# Patient Record
Sex: Female | Born: 1985 | Race: Black or African American | Hispanic: No | Marital: Single | Smoking: Never smoker
Health system: Southern US, Community
[De-identification: ages and names within clinical notes are randomized; demographics above are authoritative.]

## PROBLEM LIST (undated history)

## (undated) DIAGNOSIS — Z789 Other specified health status: Secondary | ICD-10-CM

## (undated) HISTORY — PX: NO PAST SURGERIES: SHX2092

## (undated) NOTE — *Deleted (*Deleted)
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 yo [id number] at [redacted]w[redacted]d was admitted to the hospital 11/24/2019 for induction of labor.  Indication for induction: Preeclampsia. She had initially presented for a labor eval and had mildly elevated BPs and a urine P/C ratio of 0.48 giving her a pre-e dx without severe features. Her cervix was favorable, requiring some Pitocin and then AROM before progression to delivery.  Her admission Covid test was positive and so appropriate measures were taken. Membrane Rupture Time/Date: 5:25 AM ,11/25/2019   Delivery Method:Vaginal, Spontaneous  Episiotomy: None  Lacerations:  None  Details of delivery can be found in separate delivery note.  Patient had a routine postpartum course. Patient is discharged home 11/25/19.  Newborn Data: Birth date:11/25/2019  Birth time:8:01 AM  Gender:Female  Living status:Living  Apgars:9 ,9  Weight:   Magnesium Sulfate received: No BMZ received: No (did receive 9/29 & 9/30 for PTC) Rhophylac:N/A MMR:N/A T-DaP:Given prenatally Flu: No Transfusion:No  Physical exam  Vitals:   11/25/19 0814 11/25/19 0816 11/25/19 0831 11/25/19 0846  BP:  121/67 115/79 114/75 115/83  Pulse: 93 96 93 88  Resp:  18 18 18   Temp:      TempSrc:      SpO2:      Weight:      Height:       General: {Exam; general:21111117} Lochia: {Desc; appropriate/inappropriate:30686::"appropriate"} Uterine Fundus: {Desc; firm/soft:30687} Incision: {Exam; incision:21111123} DVT Evaluation: {Exam; dvt:2111122} Labs: Lab Results  Component Value Date   WBC 8.0 11/25/2019   HGB 11.1 (L) 11/25/2019   HCT 34.8 (L) 11/25/2019   MCV 83.1 11/25/2019   PLT 214 11/25/2019   CMP Latest Ref Rng & Units 11/24/2019  Glucose 70 - 99 mg/dL 147(W)  BUN 6 - 20 mg/dL 7  Creatinine 2.95 - 6.21 mg/dL 3.08  Sodium 657 - 846 mmol/L 135  Potassium 3.5 - 5.1 mmol/L 3.7  Chloride 98 - 111 mmol/L 107  CO2 22 - 32 mmol/L 18(L)  Calcium 8.9 - 10.3 mg/dL 96.2  Total Protein 6.5 - 8.1 g/dL 6.4(L)  Total Bilirubin 0.3 - 1.2 mg/dL 9.5(M)  Alkaline Phos 38 - 126 U/L 121  AST 15 - 41 U/L 17  ALT 0 - 44 U/L 14   Edinburgh Score: No flowsheet data found.   After visit meds:  Allergies as of 11/25/2019   No Known Allergies   Med Rec must be completed prior to using this [facility]***        Discharge home in stable condition Infant Feeding: {Baby feeding:23562} Infant Disposition:{CHL IP  OB HOME WITH ZOXWRU:04540} Discharge instruction: per After Visit Summary and Postpartum booklet. Activity: Advance as tolerated. Pelvic rest for 6 weeks.  Diet: routine diet Future Appointments: Future Appointments  Date Time Provider Department Center  11/30/2019  9:15 AM Marylene Land, [name] [facility]  12/06/2019  3:35 PM Currie Paris, NP [facility]  12/13/2019  3:35 PM Armando Reichert, [name] [facility]   Follow up Visit:  Follow-up Information    Center for [facility]. Schedule an appointment as soon as possible for a visit in 4 week(s).   Specialty: Obstetrics and Gynecology Why: You will be scheduled for a  blood pressure check in 1 week, and then a 4 week visit for your postpartum appointment Contact information: [address] [place] [address] [phone]             [name], [name]  P [facility] Pool Please schedule this patient for Postpartum visit in: 1 week for BP check; 4 weeks for PP visit with the following provider: Any provider  In-Person  For C/S patients schedule nurse incision check in weeks 2 weeks: no  High risk pregnancy complicated by: pre-e  Delivery mode: SVD  Anticipated Birth Control: OCPs  PP Procedures needed: BP check (1wk)  Schedule Integrated BH visit: no    11/25/2019 [name], [name]

## (undated) NOTE — *Deleted (*Deleted)
Chief Complaint:  Contractions   None     HPI: Jessica Lutz is a 67 y.o. [id number] at [redacted]w[redacted]d by LMP who presents to maternity admissions for labor evaluation with painful contractions and decreased fetal movement.   Pt with constant abdominal pain on right and left lower sides of abdomen, plus intermittent cramping pain with contractions.  She reports she has not felt much movement since the contractions started today.   Location: low abdomen, low back, radiates into vagina Quality: cramping Severity: 9/10 on pain scale Duration: 1 day Timing: every 2 minutes Modifying factors: none Associated signs and symptoms: decreased fetal movement  HPI  Past Medical History: Past Medical History:  Diagnosis Date  . Anemia 03/22/2018    Past obstetric history: OB History  Gravida Para Term Preterm AB Living  4 1 1   2 1   SAB TAB Ectopic Multiple Live Births  2       1    # Outcome Date GA Lbr Len/2nd Weight Sex Delivery Anes PTL Lv  4 Current           3 SAB 03/30/13    U         Birth Comments: System Generated. Please review and update pregnancy details.  2 SAB 01/09/11    U      1 Term 04/13/10 [redacted]w[redacted]d  3714 g F Vag-Vacuum   LIV     Birth Comments: System Generated. Please review and update pregnancy details. wnl    Past Surgical History: Past Surgical History:  Procedure Laterality Date  . NO PAST SURGERIES      Family History: Family History  Problem Relation Age of Onset  . Thyroid disease Mother   . Alcohol abuse Neg Hx     Social History: Social History   Tobacco Use  . Smoking status: Never Smoker  . Smokeless tobacco: Never Used  Vaping Use  . Vaping Use: Never used  Substance Use Topics  . Alcohol use: Not Currently    Comment: occ  . Drug use: Not Currently    Types: Marijuana    Comment: last used 3 wks ago from 07/18/19    Allergies: No Known Allergies  Meds:  Medications Prior to Admission  Medication Sig Dispense Refill Last Dose  . prenatal  vitamin w/FE, FA (PRENATAL 1 + 1) 27-1 MG TABS tablet Take 1 tablet by mouth daily at 12 noon. 90 tablet 3 Past Month at Unknown time  . acetaminophen (TYLENOL) 325 MG tablet Take 2 tablets (650 mg total) by mouth every 6 (six) hours as needed for mild pain or moderate pain (mild discomfortORtemperature>/= 100.4 F).     Marland Kitchen aspirin EC 81 MG tablet Take 1 tablet (81 mg total) by mouth daily. 30 tablet 6 More than a month at Unknown time  . Blood Pressure Monitoring DEVI 1 Device by Does not apply route once a week. 1 Device 0   . terconazole (TERAZOL 7) 0.4 % vaginal cream Place 1 applicator vaginally at bedtime. (Patient not taking: Reported on 11/22/2019) 45 g 0     ROS:  Review of Systems  Constitutional: Negative for chills, fatigue and fever.  Eyes: Negative for visual disturbance.  Respiratory: Negative for shortness of breath.   Cardiovascular: Negative for chest pain.  Gastrointestinal: Positive for abdominal pain. Negative for nausea and vomiting.  Genitourinary: Positive for pelvic pain and vaginal pain. Negative for difficulty urinating, dysuria, flank pain, vaginal bleeding and vaginal discharge.  Musculoskeletal: Positive  for back pain.  Neurological: Negative for dizziness and headaches.  Psychiatric/Behavioral: Negative.      I have reviewed patient's Past Medical Hx, Surgical Hx, Family Hx, Social Hx, medications and allergies.   Physical Exam   Patient Vitals for the past 24 hrs:  BP Temp Temp src Pulse Resp SpO2  11/24/19 0745 126/85 - - 98 18 97 %  11/24/19 0300 124/74 - - (!) 103 - -  11/24/19 0200 126/83 - - (!) 111 - -  11/24/19 0145 - - - - - 99 %  11/24/19 0140 - - - - - 100 %  11/24/19 0135 - - - - - 100 %  11/24/19 0131 117/84 - - (!) 106 - -  11/24/19 0130 - - - - - 97 %  11/24/19 0101 127/86 - - (!) 109 - -  11/24/19 0045 131/82 - - (!) 107 - -  11/24/19 0039 (!) 146/93 97.9 F (36.6 C) Oral (!) 109 20 100 %   Constitutional: Well-developed,  well-nourished female in no acute distress.  HEART: normal rate, heart sounds, regular rhythm RESP: normal effort, lung sounds clear and equal bilaterally GI: Abd soft, non-tender, gravid appropriate for gestational age.  MS: Extremities nontender, +1 BLE edema, normal ROM Neurologic: Alert and oriented x 4.  GU: Neg CVAT.   Dilation: 5 Effacement (%): 70 Cervical Position: Posterior Station: -2 Presentation: Vertex Exam by:: Sharen Counter, [name]  FHT:  Baseline 150 , moderate variability, accelerations present, no decelerations Contractions: q 2-5 mins, mild to moderate to palpation   Labs: Results for orders placed or performed during the hospital encounter of 11/24/19 (from the past 24 hour(s))  CBC     Status: Abnormal   Collection Time: 11/24/19  2:20 AM  Result Value Ref Range   WBC 7.6 4.0 - 10.5 K/uL   RBC 4.42 3.87 - 5.11 MIL/uL   Hemoglobin 11.5 (L) 12.0 - 15.0 g/dL   HCT 16.1 36 - 46 %   MCV 82.8 80.0 - 100.0 fL   MCH 26.0 26.0 - 34.0 pg   MCHC 31.4 30.0 - 36.0 g/dL   RDW 09.6 04.5 - 40.9 %   Platelets 233 150 - 400 K/uL   nRBC 0.0 0.0 - 0.2 %  Comprehensive metabolic panel     Status: Abnormal   Collection Time: 11/24/19  2:20 AM  Result Value Ref Range   Sodium 135 135 - 145 mmol/L   Potassium 3.7 3.5 - 5.1 mmol/L   Chloride 107 98 - 111 mmol/L   CO2 18 (L) 22 - 32 mmol/L   Glucose, Bld 110 (H) 70 - 99 mg/dL   BUN 7 6 - 20 mg/dL   Creatinine, Ser 8.11 0.44 - 1.00 mg/dL   Calcium 91.4 8.9 - 78.2 mg/dL   Total Protein 6.4 (L) 6.5 - 8.1 g/dL   Albumin 2.8 (L) 3.5 - 5.0 g/dL   AST 17 15 - 41 U/L   ALT 14 0 - 44 U/L   Alkaline Phosphatase 121 38 - 126 U/L   Total Bilirubin 0.2 (L) 0.3 - 1.2 mg/dL   GFR, Estimated >95 >62 mL/min   Anion gap 10 5 - 15  Protein / creatinine ratio, urine     Status: Abnormal   Collection Time: 11/24/19  3:31 AM  Result Value Ref Range   Creatinine, Urine 60.84 mg/dL   Total Protein, Urine 29 mg/dL   Protein Creatinine  Ratio 0.48 (H) 0.00 - 0.15 mg/mg[Cre]  Respiratory  Panel by RT PCR (Flu A&B, Covid) - Nasopharyngeal Swab     Status: Abnormal   Collection Time: 11/24/19  5:02 AM   Specimen: Nasopharyngeal Swab  Result Value Ref Range   SARS Coronavirus 2 by RT PCR POSITIVE (A) NEGATIVE   Influenza A by PCR NEGATIVE NEGATIVE   Influenza B by PCR NEGATIVE NEGATIVE   --/--/A POS (09/29 0810)  Imaging:  No results found.  MAU Course/MDM: Orders Placed This Encounter  Procedures  . Respiratory Panel by RT PCR (Flu A&B, Covid) - Nasopharyngeal Swab  . CBC  . Comprehensive metabolic panel  . Protein / creatinine ratio, urine  . Discharge patient    Meds ordered this encounter  Medications  . lactated ringers bolus 1,000 mL  . fentaNYL (SUBLIMAZE) injection 100 mcg  . ondansetron (ZOFRAN) injection 4 mg  . ondansetron (ZOFRAN) injection 4 mg  . DISCONTD: ondansetron (ZOFRAN) injection 4 mg  . oxyCODONE-acetaminophen (PERCOCET/ROXICET) 5-325 MG tablet    Sig: Take 2 tablets by mouth every 4 (four) hours as needed.    Dispense:  4 tablet    Refill:  0    Order Specific Question:   Supervising Provider    Answer:   Jaynie Collins A [[id number]]     NST reviewed and reactive Initial BP elevated, no s/sx of PEC Cervix 4.5/80/-2, unchanged in 1 hour following exam from RN but pt breathing with frequent contractions at this time IV started and IV Fentanyl given for pain management PEC labs ordered and sent  P/C ratio 0.48 Cervix remains 4-5 cm/70%/-2, vertex  Consult Dr Lovett Sox with presentation, exam findings and test results.     Treatments in MAU included ***.   Pt discharge with strict *** precautions.    Assessment: 1. Threatened labor at term   2. Lab test positive for detection of COVID-19 virus   3. Elevated blood-pressure reading, without diagnosis of hypertension   4. [redacted] weeks gestation of pregnancy     Plan: Discharge home Labor precautions and fetal kick counts  Follow-up  Information    Center for [facility] Follow up.   Specialty: Obstetrics and Gynecology Why: Return to MAU as needed for emergencies. Contact information: [address] [place] [address] [phone]             Allergies as of 11/24/2019   No Known Allergies   Med Rec must be completed prior to using this [facility]***       Sharen Counter Certified Nurse-Midwife 11/24/2019 8:43 AM

---
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
## 2006-03-26 ENCOUNTER — Emergency Department (HOSPITAL_COMMUNITY): Admission: EM | Admit: 2006-03-26 | Discharge: 2006-03-26 | Payer: Self-pay | Admitting: Emergency Medicine

## 2006-04-24 ENCOUNTER — Emergency Department (HOSPITAL_COMMUNITY): Admission: EM | Admit: 2006-04-24 | Discharge: 2006-04-25 | Payer: Self-pay | Admitting: Emergency Medicine

## 2007-10-06 ENCOUNTER — Emergency Department (HOSPITAL_COMMUNITY): Admission: EM | Admit: 2007-10-06 | Discharge: 2007-10-06 | Payer: Self-pay | Admitting: Emergency Medicine

## 2008-01-02 ENCOUNTER — Emergency Department (HOSPITAL_COMMUNITY): Admission: EM | Admit: 2008-01-02 | Discharge: 2008-01-02 | Payer: Self-pay | Admitting: Emergency Medicine

## 2008-04-26 ENCOUNTER — Emergency Department (HOSPITAL_COMMUNITY): Admission: EM | Admit: 2008-04-26 | Discharge: 2008-04-26 | Payer: Self-pay | Admitting: Emergency Medicine

## 2009-08-08 ENCOUNTER — Ambulatory Visit: Payer: Self-pay | Admitting: Nurse Practitioner

## 2009-08-08 ENCOUNTER — Inpatient Hospital Stay (HOSPITAL_COMMUNITY): Admission: AD | Admit: 2009-08-08 | Discharge: 2009-08-08 | Payer: Self-pay | Admitting: Obstetrics & Gynecology

## 2009-11-13 ENCOUNTER — Ambulatory Visit (HOSPITAL_COMMUNITY): Admission: RE | Admit: 2009-11-13 | Discharge: 2009-11-13 | Payer: Self-pay | Admitting: Obstetrics & Gynecology

## 2010-03-01 ENCOUNTER — Inpatient Hospital Stay (HOSPITAL_COMMUNITY)
Admission: AD | Admit: 2010-03-01 | Discharge: 2010-03-02 | Payer: Self-pay | Source: Home / Self Care | Attending: Obstetrics & Gynecology | Admitting: Obstetrics & Gynecology

## 2010-03-03 ENCOUNTER — Inpatient Hospital Stay (HOSPITAL_COMMUNITY)
Admission: AD | Admit: 2010-03-03 | Discharge: 2010-03-03 | Payer: Self-pay | Source: Home / Self Care | Attending: Obstetrics and Gynecology | Admitting: Obstetrics and Gynecology

## 2010-03-03 ENCOUNTER — Other Ambulatory Visit: Payer: Self-pay | Admitting: Obstetrics and Gynecology

## 2010-03-04 LAB — URINALYSIS, ROUTINE W REFLEX MICROSCOPIC
Bilirubin Urine: NEGATIVE
Hgb urine dipstick: NEGATIVE
Hgb urine dipstick: NEGATIVE
Ketones, ur: NEGATIVE mg/dL
Nitrite: NEGATIVE
Protein, ur: NEGATIVE mg/dL
Specific Gravity, Urine: 1.02 (ref 1.005–1.030)
Urine Glucose, Fasting: NEGATIVE mg/dL
Urobilinogen, UA: 0.2 mg/dL (ref 0.0–1.0)

## 2010-03-04 LAB — URINE MICROSCOPIC-ADD ON

## 2010-03-24 ENCOUNTER — Inpatient Hospital Stay (HOSPITAL_COMMUNITY)
Admission: AD | Admit: 2010-03-24 | Discharge: 2010-03-25 | Disposition: A | Discharge status: Home / Self Care | Payer: Managed Care, Other (non HMO) | Attending: Obstetrics and Gynecology | Admitting: Obstetrics and Gynecology

## 2010-03-24 DIAGNOSIS — O99891 Other specified diseases and conditions complicating pregnancy: Secondary | ICD-10-CM | POA: Insufficient documentation

## 2010-03-24 DIAGNOSIS — R109 Unspecified abdominal pain: Secondary | ICD-10-CM | POA: Insufficient documentation

## 2010-04-13 ENCOUNTER — Inpatient Hospital Stay (HOSPITAL_COMMUNITY)
Admission: RE | Admit: 2010-04-13 | Discharge: 2010-04-15 | DRG: 775 | Disposition: A | Discharge status: Home / Self Care | Payer: Managed Care, Other (non HMO) | Source: Ambulatory Visit | Attending: Obstetrics and Gynecology | Admitting: Obstetrics and Gynecology

## 2010-04-13 DIAGNOSIS — D649 Anemia, unspecified: Secondary | ICD-10-CM | POA: Diagnosis not present

## 2010-04-13 DIAGNOSIS — O99892 Other specified diseases and conditions complicating childbirth: Secondary | ICD-10-CM | POA: Diagnosis present

## 2010-04-13 DIAGNOSIS — O9903 Anemia complicating the puerperium: Secondary | ICD-10-CM | POA: Diagnosis not present

## 2010-04-13 DIAGNOSIS — Z22330 Carrier of Group B streptococcus: Secondary | ICD-10-CM

## 2010-04-13 LAB — CBC
HCT: 33.1 % — ABNORMAL LOW (ref 36.0–46.0)
Hemoglobin: 11.1 g/dL — ABNORMAL LOW (ref 12.0–15.0)
MCV: 77.9 fL — ABNORMAL LOW (ref 78.0–100.0)
RBC: 4.25 MIL/uL (ref 3.87–5.11)
WBC: 8.9 10*3/uL (ref 4.0–10.5)

## 2010-04-14 LAB — CBC
Hemoglobin: 8.5 g/dL — ABNORMAL LOW (ref 12.0–15.0)
MCH: 26 pg (ref 26.0–34.0)
MCV: 79.5 fL (ref 78.0–100.0)
RBC: 3.27 MIL/uL — ABNORMAL LOW (ref 3.87–5.11)
WBC: 14.9 10*3/uL — ABNORMAL HIGH (ref 4.0–10.5)

## 2010-04-17 NOTE — Discharge Summary (Signed)
  [name], Jessica Lutz              ACCOUNT NO.:  [id number]  MEDICAL RECORD NO.:  [id number]           PATIENT TYPE:  I  LOCATION:  9146                          FACILITY:  [facility]  PHYSICIAN:  Gerrit Friends. Aldona Bar, M.D.   DATE OF BIRTH:  [birth year]  DATE OF ADMISSION:  04/13/2010 DATE OF DISCHARGE:  04/15/2010                              DISCHARGE SUMMARY   DISCHARGE DIAGNOSES: 1. Term pregnancy, delivered 8 pounds 2 ounces female infant, Apgars 9     and 9. 2. Blood type A positive. 3. Obesity.  PROCEDURE: 1. Vacuum extraction assisted delivery. 2. First-degree tear and bilateral clitoral tears, repaired.  SUMMARY:  This 25 year old gravida 1 with a due date of April 11, 2010, was admitted in labor.  Group B strep was positive, and she received intrapartum antibiotics.  She progressed and required the use of the vacuum extractor to assist her delivery because of poor pushing effort. She was delivered of a viable 8 pounds 2 ounces female infant with Apgars of 9 and 9 over first-degree tear.  There were also bilateral clitoral tears, which were repaired as well after delivery.  Her postpartum course was benign.  Her discharge hemoglobin was 8.5 with a white count of 14,900, and a platelet count of 201,000.  On the morning of April 15, 2010, she was ambulating well, tolerating a regular diet well, having normal bowel and bladder function, was afebrile.  Her breast-feeding was doing well, and she was desirous of discharge. Accordingly, she was given all appropriate instructions per discharge brochure and understood all instructions well.  DISCHARGE MEDICATIONS:  Vitamins - 1 a day as long as she is breast- feeding, Feosol capsules with the equivalent thereof 1 daily, and she was given prescriptions for Motrin 600 mg to use every 6 hours as needed for cramping or mild pain, and Tylox 1-2 every 4-6 hours as needed for more severe pain.  She will return to the office in approximately 4  weeks' time or as needed.  CONDITION ON DISCHARGE:  Improved.     Gerrit Friends. Aldona Bar, M.D.     RMW/MEDQ  D:  04/15/2010  T:  04/15/2010  Job:  [id number]  Electronically Signed by Jessica Lutz M.D. on 04/16/2010 05:59:05 AM

## 2010-04-27 LAB — URINALYSIS, ROUTINE W REFLEX MICROSCOPIC
Bilirubin Urine: NEGATIVE
Glucose, UA: NEGATIVE mg/dL
Ketones, ur: NEGATIVE mg/dL
Nitrite: NEGATIVE
Specific Gravity, Urine: 1.01 (ref 1.005–1.030)
pH: 7 (ref 5.0–8.0)

## 2010-04-27 LAB — POCT PREGNANCY, URINE: Preg Test, Ur: POSITIVE

## 2010-04-27 LAB — URINE MICROSCOPIC-ADD ON

## 2010-05-22 LAB — DIFFERENTIAL
Basophils Relative: 0 % (ref 0–1)
Eosinophils Absolute: 0 10*3/uL (ref 0.0–0.7)
Eosinophils Relative: 0 % (ref 0–5)
Lymphs Abs: 1.6 10*3/uL (ref 0.7–4.0)
Monocytes Absolute: 0.6 10*3/uL (ref 0.1–1.0)
Monocytes Relative: 7 % (ref 3–12)
Neutrophils Relative %: 75 % (ref 43–77)

## 2010-05-22 LAB — LIPASE, BLOOD: Lipase: 20 U/L (ref 11–59)

## 2010-05-22 LAB — BASIC METABOLIC PANEL
CO2: 27 mEq/L (ref 19–32)
Chloride: 106 mEq/L (ref 96–112)
Creatinine, Ser: 0.83 mg/dL (ref 0.4–1.2)
GFR calc Af Amer: >60 mL/min (ref >60)
Potassium: 3.3 mEq/L — ABNORMAL LOW (ref 3.5–5.1)
Sodium: 137 mEq/L (ref 135–145)

## 2010-05-22 LAB — HEPATIC FUNCTION PANEL
Albumin: 3.4 g/dL — ABNORMAL LOW (ref 3.5–5.2)
Alkaline Phosphatase: 68 U/L (ref 39–117)
Bilirubin, Direct: 0.1 mg/dL (ref 0.0–0.3)
Total Bilirubin: 0.7 mg/dL (ref 0.3–1.2)

## 2010-05-22 LAB — URINE MICROSCOPIC-ADD ON

## 2010-05-22 LAB — URINALYSIS, ROUTINE W REFLEX MICROSCOPIC
Bilirubin Urine: NEGATIVE
Ketones, ur: NEGATIVE mg/dL
Leukocytes, UA: NEGATIVE
Nitrite: NEGATIVE
Specific Gravity, Urine: 1.024 (ref 1.005–1.030)
Urobilinogen, UA: 1 mg/dL (ref 0.0–1.0)
pH: 7 (ref 5.0–8.0)

## 2010-05-22 LAB — CBC
HCT: 37 % (ref 36.0–46.0)
Hemoglobin: 12.4 g/dL (ref 12.0–15.0)
MCHC: 33.7 g/dL (ref 30.0–36.0)
MCV: 85.1 fL (ref 78.0–100.0)
RBC: 4.34 MIL/uL (ref 3.87–5.11)
WBC: 8.7 10*3/uL (ref 4.0–10.5)

## 2010-05-22 LAB — POCT PREGNANCY, URINE: Preg Test, Ur: NEGATIVE

## 2010-11-11 LAB — GC/CHLAMYDIA PROBE AMP, GENITAL
Chlamydia, DNA Probe: NEGATIVE
GC Probe Amp, Genital: NEGATIVE

## 2010-11-11 LAB — WET PREP, GENITAL: Trich, Wet Prep: NONE SEEN

## 2010-11-11 LAB — CBC
HCT: 37.8
Platelets: 234
RDW: 12.6
WBC: 6.3

## 2010-11-11 LAB — POCT I-STAT, CHEM 8
Chloride: 103
Glucose, Bld: 90
HCT: 39
Potassium: 3.8

## 2010-11-11 LAB — DIFFERENTIAL
Basophils Absolute: 0
Eosinophils Absolute: 0.1
Eosinophils Relative: 1
Lymphocytes Relative: 52 — ABNORMAL HIGH
Neutrophils Relative %: 38 — ABNORMAL LOW

## 2010-12-29 ENCOUNTER — Inpatient Hospital Stay (HOSPITAL_COMMUNITY)
Admission: AD | Admit: 2010-12-29 | Discharge: 2010-12-29 | Disposition: A | Discharge status: Home / Self Care | Payer: Managed Care, Other (non HMO) | Source: Ambulatory Visit | Attending: Obstetrics & Gynecology | Admitting: Obstetrics & Gynecology

## 2010-12-29 ENCOUNTER — Encounter (HOSPITAL_COMMUNITY): Payer: Self-pay | Admitting: *Deleted

## 2010-12-29 DIAGNOSIS — Z3201 Encounter for pregnancy test, result positive: Secondary | ICD-10-CM

## 2010-12-29 HISTORY — DX: Other specified health status: Z78.9

## 2010-12-29 MED ORDER — PROMETHAZINE HCL 25 MG PO TABS: 25.0000 mg | ORAL_TABLET | Freq: Four times a day (QID) | ORAL | Status: AC | PRN

## 2010-12-29 NOTE — ED Provider Notes (Signed)
History   Pt presents today requesting a preg test. She states she missed her period and has had some nausea. She denies pain, vag dc, bleeding, or any problems. She states she just wanted a preg test. She has an 43 month old girl at home.  Chief Complaint  Patient presents with  . Possible Pregnancy   HPI  OB History    Grav Para Term Preterm Abortions TAB SAB Ect Mult Living   1 1 1       1       Past Medical History  Diagnosis Date  . No pertinent past medical history     Past Surgical History  Procedure Date  . No past surgeries     No family history on file.  History  Substance Use Topics  . Smoking status: Never Smoker   . Smokeless tobacco: Not on file  . Alcohol Use: No    Allergies: No Known Allergies  No prescriptions prior to admission    Review of Systems  Constitutional: Negative for fever.  Cardiovascular: Negative for chest pain.  Gastrointestinal: Negative for nausea, vomiting, abdominal pain, diarrhea and constipation.  Genitourinary: Negative for dysuria, urgency, frequency and hematuria.  Neurological: Negative for dizziness and headaches.  Psychiatric/Behavioral: Negative for depression and suicidal ideas.   Physical Exam   Blood pressure 129/74, pulse 88, temperature 99 F (37.2 C), temperature source Oral, resp. rate 20, height 5\' 5"  (1.651 m), weight 188 lb (85.276 kg), last menstrual period 11/22/2010, SpO2 98.00%.  Physical Exam  Nursing note and vitals reviewed. Constitutional: She is oriented to person, place, and time. She appears well-developed and well-nourished. No distress.  HENT:  Head: Normocephalic and atraumatic.  Eyes: EOM are normal. Pupils are equal, round, and reactive to light.  GI: Soft. She exhibits no distension. There is no tenderness. There is no rebound and no guarding.  Neurological: She is alert and oriented to person, place, and time.  Skin: Skin is warm and dry. She is not diaphoretic.  Psychiatric: She has  a normal mood and affect. Her behavior is normal. Judgment and thought content normal.    MAU Course  Procedures  Results for orders placed during the hospital encounter of 12/29/10 (from the past 24 hour(s))  POCT PREGNANCY, URINE     Status: Normal   Collection Time   12/29/10  9:56 PM      Component Value Range   Preg Test, Ur POSITIVE       Assessment and Plan  Preg: discussed with pt at length. Will give Rx for phenergan to use prn. Discussed diet, activity, risks, and precautions.  Clinton Gallant. Ranell Skibinski III, DrHSc, MPAS, PA-C  12/29/2010, 10:22 PM   Henrietta Hoover, PA 12/29/10 2225

## 2010-12-29 NOTE — Progress Notes (Signed)
Pt states she just wants to find out if she is pregnant

## 2010-12-31 ENCOUNTER — Inpatient Hospital Stay (HOSPITAL_COMMUNITY)
Admission: AD | Admit: 2010-12-31 | Discharge: 2010-12-31 | Disposition: A | Discharge status: Home / Self Care | Payer: Managed Care, Other (non HMO) | Source: Ambulatory Visit | Attending: Obstetrics & Gynecology | Admitting: Obstetrics & Gynecology

## 2010-12-31 ENCOUNTER — Inpatient Hospital Stay (HOSPITAL_COMMUNITY): Payer: Managed Care, Other (non HMO)

## 2010-12-31 ENCOUNTER — Encounter (HOSPITAL_COMMUNITY): Payer: Self-pay | Admitting: *Deleted

## 2010-12-31 DIAGNOSIS — O209 Hemorrhage in early pregnancy, unspecified: Secondary | ICD-10-CM

## 2010-12-31 LAB — CBC
Hemoglobin: 13 g/dL (ref 12.0–15.0)
MCH: 27 pg (ref 26.0–34.0)
RBC: 4.82 MIL/uL (ref 3.87–5.11)

## 2010-12-31 LAB — ABO/RH: ABO/RH(D): A POS

## 2010-12-31 LAB — WET PREP, GENITAL
Clue Cells Wet Prep HPF POC: NONE SEEN
Trich, Wet Prep: NONE SEEN

## 2010-12-31 LAB — HCG, QUANTITATIVE, PREGNANCY: hCG, Beta Chain, Quant, S: 63 m[IU]/mL — ABNORMAL HIGH (ref <5)

## 2010-12-31 NOTE — ED Provider Notes (Signed)
History   Pt presents today c/o vag bleeding, "like a period." She states she is also having some cramping. She denies vag irritation, severe abd pain, fever, or any other sx at this time.  Chief Complaint  Patient presents with  . Vaginal Bleeding   HPI  OB History    Grav Para Term Preterm Abortions TAB SAB Ect Mult Living   2 1 1       1       Past Medical History  Diagnosis Date  . No pertinent past medical history     Past Surgical History  Procedure Date  . No past surgeries     History reviewed. No pertinent family history.  History  Substance Use Topics  . Smoking status: Never Smoker   . Smokeless tobacco: Not on file  . Alcohol Use: No    Allergies: No Known Allergies  Prescriptions prior to admission  Medication Sig Dispense Refill  . promethazine (PHENERGAN) 25 MG tablet Take 1 tablet (25 mg total) by mouth every 6 (six) hours as needed for nausea.  30 tablet  0    Review of Systems  Constitutional: Negative for fever.  Cardiovascular: Negative for chest pain.  Gastrointestinal: Positive for abdominal pain. Negative for nausea, vomiting, diarrhea and constipation.  Genitourinary: Negative for dysuria, urgency, frequency and hematuria.  Neurological: Negative for dizziness and headaches.  Psychiatric/Behavioral: Negative for depression and suicidal ideas.   Physical Exam   Blood pressure 105/64, pulse 86, temperature 98.5 F (36.9 C), temperature source Oral, resp. rate 20, height 5\' 7"  (1.702 m), weight 187 lb 12.8 oz (85.186 kg), last menstrual period 11/22/2010, SpO2 98.00%, unknown if currently breastfeeding.  Physical Exam  Nursing note and vitals reviewed. Constitutional: She is oriented to person, place, and time. She appears well-developed and well-nourished. No distress.  HENT:  Head: Normocephalic and atraumatic.  Eyes: EOM are normal. Pupils are equal, round, and reactive to light.  GI: Soft. She exhibits no distension. There is  tenderness. There is no rebound and no guarding.  Genitourinary: There is bleeding around the vagina. Vaginal discharge found.       Bloody dc present in vag  Vault. Cervix Lg/closed. No adnexal masses. Uterus slightly tender to palpation.  Neurological: She is alert and oriented to person, place, and time.  Skin: Skin is warm and dry. She is not diaphoretic.  Psychiatric: She has a normal mood and affect. Her behavior is normal. Judgment and thought content normal.    MAU Course  Procedures  Results for orders placed during the hospital encounter of 12/31/10 (from the past 24 hour(s))  CBC     Status: Normal   Collection Time   12/31/10 11:35 AM      Component Value Range   WBC 6.8  4.0 - 10.5 (K/uL)   RBC 4.82  3.87 - 5.11 (MIL/uL)   Hemoglobin 13.0  12.0 - 15.0 (g/dL)   HCT 29.5  28.4 - 13.2 (%)   MCV 81.7  78.0 - 100.0 (fL)   MCH 27.0  26.0 - 34.0 (pg)   MCHC 33.0  30.0 - 36.0 (g/dL)   RDW 44.0  10.2 - 72.5 (%)   Platelets 290  150 - 400 (K/uL)  HCG, QUANTITATIVE, PREGNANCY     Status: Abnormal   Collection Time   12/31/10 11:35 AM      Component Value Range   hCG, Beta Chain, Quant, S 63 (*) <5 (mIU/mL)  ABO/RH     Status:  Normal   Collection Time   12/31/10 11:35 AM      Component Value Range   ABO/RH(D) A POS    WET PREP, GENITAL     Status: Abnormal   Collection Time   12/31/10 12:30 PM      Component Value Range   Yeast, Wet Prep NONE SEEN  NONE SEEN    Trich, Wet Prep NONE SEEN  NONE SEEN    Clue Cells, Wet Prep NONE SEEN  NONE SEEN    WBC, Wet Prep HPF POC FEW (*) NONE SEEN    US shows no IUP or adnexal masses.  Assessment and Plan  Bleeding in preg: discussed with pt at length. She will return on 01/02/11 for repeat B-quant. Discussed signs and sx of ectopic preg. Discussed diet, activity, risks, and precautions.  Clinton Gallant. Clerence Gubser III, DrHSc, MPAS, PA-C  12/31/2010, 12:34 PM   Henrietta Hoover, PA 12/31/10 1302

## 2010-12-31 NOTE — Progress Notes (Signed)
Was here Mon, had spotting last week did not tell them.  Started spotting again yesterday, today is more like a light period, but dark in color.

## 2010-12-31 NOTE — Progress Notes (Signed)
Pt in c/o vaginal bleeding since yesterday, was light yesterday but today is a little heavier like light period with darker blood.   Denies any abdominal pain.  Last intercourse 12/19/10.

## 2011-01-01 LAB — GC/CHLAMYDIA PROBE AMP, GENITAL: Chlamydia, DNA Probe: NEGATIVE

## 2011-01-02 ENCOUNTER — Inpatient Hospital Stay (HOSPITAL_COMMUNITY)
Admission: AD | Admit: 2011-01-02 | Discharge: 2011-01-02 | Disposition: A | Discharge status: Home / Self Care | Payer: Managed Care, Other (non HMO) | Source: Ambulatory Visit | Attending: Obstetrics & Gynecology | Admitting: Obstetrics & Gynecology

## 2011-01-02 DIAGNOSIS — O209 Hemorrhage in early pregnancy, unspecified: Secondary | ICD-10-CM

## 2011-01-02 NOTE — ED Provider Notes (Signed)
History   Pt presents today for repeat B-quant secondary to bleeding in preg. She states her bleeding has improved and she only has minimal cramping. She denies any new sx at this time.  Chief Complaint  Patient presents with  . Follow-up   HPI  OB History    Grav Para Term Preterm Abortions TAB SAB Ect Mult Living   2 1 1       1       Past Medical History  Diagnosis Date  . No pertinent past medical history     Past Surgical History  Procedure Date  . No past surgeries     No family history on file.  History  Substance Use Topics  . Smoking status: Never Smoker   . Smokeless tobacco: Not on file  . Alcohol Use: No    Allergies: No Known Allergies  Prescriptions prior to admission  Medication Sig Dispense Refill  . promethazine (PHENERGAN) 25 MG tablet Take 1 tablet (25 mg total) by mouth every 6 (six) hours as needed for nausea.  30 tablet  0    Review of Systems  Constitutional: Negative for fever.  Cardiovascular: Negative for chest pain.  Gastrointestinal: Negative for nausea, vomiting, abdominal pain, diarrhea and constipation.  Genitourinary: Negative for dysuria, urgency, frequency and hematuria.  Neurological: Negative for dizziness and headaches.  Psychiatric/Behavioral: Negative for depression and suicidal ideas.   Physical Exam   Blood pressure 115/70, pulse 69, last menstrual period 11/22/2010, unknown if currently breastfeeding.  Physical Exam  Nursing note and vitals reviewed. Constitutional: She is oriented to person, place, and time. She appears well-developed and well-nourished. No distress.  HENT:  Head: Normocephalic and atraumatic.  Eyes: EOM are normal. Pupils are equal, round, and reactive to light.  GI: Soft. She exhibits no distension. There is no tenderness. There is no rebound and no guarding.  Neurological: She is alert and oriented to person, place, and time.  Skin: Skin is warm and dry. She is not diaphoretic.  Psychiatric:  She has a normal mood and affect. Her behavior is normal. Judgment and thought content normal.    MAU Course  Procedures  Results for orders placed during the hospital encounter of 01/02/11 (from the past 24 hour(s))  HCG, QUANTITATIVE, PREGNANCY     Status: Abnormal   Collection Time   01/02/11  1:56 PM      Component Value Range   hCG, Beta Chain, Quant, S 147 (*) <5 (mIU/mL)     Assessment and Plan  Bleeding in preg: discussed with pt at length. She will return in 1wk for repeat US to confirm IUP and viability. Discussed diet, activity, risks, and precautions. Discussed signs and sx of ectopic preg.  Clinton Gallant. Tabbitha Janvrin III, DrHSc, MPAS, PA-C  01/02/2011, 1:54 PM   Henrietta Hoover, PA 01/02/11 1448

## 2011-01-02 NOTE — Progress Notes (Signed)
Patient to MAU for a repeat BHCG. Patient states she is having no pain and a little light bleeding.

## 2011-01-09 ENCOUNTER — Inpatient Hospital Stay (HOSPITAL_COMMUNITY)
Admission: AD | Admit: 2011-01-09 | Discharge: 2011-01-09 | Disposition: A | Discharge status: Home / Self Care | Payer: Managed Care, Other (non HMO) | Source: Ambulatory Visit | Attending: Family Medicine | Admitting: Family Medicine

## 2011-01-09 ENCOUNTER — Inpatient Hospital Stay (HOSPITAL_COMMUNITY): Payer: Managed Care, Other (non HMO)

## 2011-01-09 ENCOUNTER — Encounter (HOSPITAL_COMMUNITY): Payer: Self-pay

## 2011-01-09 DIAGNOSIS — O039 Complete or unspecified spontaneous abortion without complication: Secondary | ICD-10-CM | POA: Insufficient documentation

## 2011-01-09 DIAGNOSIS — O4690 Antepartum hemorrhage, unspecified, unspecified trimester: Secondary | ICD-10-CM

## 2011-01-09 NOTE — ED Provider Notes (Signed)
History     Chief Complaint  Patient presents with  . Vaginal Bleeding  . Follow-up   HPI Pt here for ultrasound.  Seen previously for vaginal bleeding on 11/21.  BHCGs 63 11/21, 147 11/23; A+ blood type; wet prep neg.    Past Medical History  Diagnosis Date  . No pertinent past medical history     Past Surgical History  Procedure Date  . No past surgeries     History reviewed. No pertinent family history.  History  Substance Use Topics  . Smoking status: Never Smoker   . Smokeless tobacco: Never Used  . Alcohol Use: No    Allergies: No Known Allergies  No prescriptions prior to admission    ROS No problems at this visit  Physical Exam   Blood pressure 106/66, pulse 81, temperature 98.5 F (36.9 C), temperature source Oral, resp. rate 16, height 5\' 6"  (1.676 m), weight 85.503 kg (188 lb 8 oz), last menstrual period 11/22/2010.  Physical Exam  Constitutional: She is oriented to person, place, and time. She appears well-developed and well-nourished. No distress.  HENT:  Head: Normocephalic.  Neck: Normal range of motion. Neck supple.  Neurological: She is alert and oriented to person, place, and time. She has normal reflexes.  Skin: Skin is warm and dry.    MAU Course  Procedures  BHCG - 38 [place] - No IUP or adnexal mass identified.  Recommend continued f/u of BHCG (quant).  Assessment and Plan  Miscarriage  Plan: DC to home *Called pt back and notified her of decreasing beta HCG Return in one week for [facility]  [facility] 01/09/2011, 4:11 PM

## 2011-01-09 NOTE — Progress Notes (Signed)
Pt called at [phone] and notified of decreasing BHCG; this in combination with no IUP identified on [place] indicates miscarriage; informed to follow-up in one week for repeat BHCG; given ectopic precautions.

## 2011-01-09 NOTE — Progress Notes (Signed)
Pt states, " I am here for a follow-up u/s from one week ago. I am still having vaginal bleeding, but no pain."

## 2011-01-10 NOTE — ED Provider Notes (Signed)
Chart reviewed and agree with management and plan.  

## 2011-01-16 ENCOUNTER — Ambulatory Visit (HOSPITAL_COMMUNITY): Payer: Managed Care, Other (non HMO)

## 2012-01-18 ENCOUNTER — Inpatient Hospital Stay (HOSPITAL_COMMUNITY)
Admission: AD | Admit: 2012-01-18 | Discharge: 2012-01-18 | Disposition: A | Discharge status: Home / Self Care | Payer: Managed Care, Other (non HMO) | Source: Ambulatory Visit | Attending: Obstetrics & Gynecology | Admitting: Obstetrics & Gynecology

## 2012-01-18 DIAGNOSIS — M545 Low back pain, unspecified: Secondary | ICD-10-CM | POA: Insufficient documentation

## 2012-01-18 DIAGNOSIS — R1012 Left upper quadrant pain: Secondary | ICD-10-CM | POA: Insufficient documentation

## 2012-01-18 LAB — POCT PREGNANCY, URINE: Preg Test, Ur: NEGATIVE

## 2012-01-18 LAB — URINALYSIS, ROUTINE W REFLEX MICROSCOPIC
Glucose, UA: NEGATIVE mg/dL
Hgb urine dipstick: NEGATIVE
Specific Gravity, Urine: 1.02 (ref 1.005–1.030)
pH: 7 (ref 5.0–8.0)

## 2012-01-18 NOTE — MAU Note (Signed)
Pt called, not in lobby 

## 2012-01-18 NOTE — MAU Note (Signed)
Pt would like STD testing, has lower back pain, intermittent abd cramping.  Pt states she had new sex partner once.

## 2012-01-18 NOTE — MAU Note (Signed)
Called patient from lobby and she was not present.

## 2012-01-18 NOTE — MAU Note (Signed)
Pt called, not in lobby.  Left, did not inform staff.

## 2013-02-28 ENCOUNTER — Emergency Department (HOSPITAL_COMMUNITY)
Admission: EM | Admit: 2013-02-28 | Discharge: 2013-02-28 | Disposition: A | Discharge status: Home / Self Care | Payer: No Typology Code available for payment source | Attending: Emergency Medicine | Admitting: Emergency Medicine

## 2013-02-28 ENCOUNTER — Encounter (HOSPITAL_COMMUNITY): Payer: Self-pay | Admitting: Emergency Medicine

## 2013-02-28 DIAGNOSIS — S335XXA Sprain of ligaments of lumbar spine, initial encounter: Secondary | ICD-10-CM | POA: Insufficient documentation

## 2013-02-28 DIAGNOSIS — Y9389 Activity, other specified: Secondary | ICD-10-CM | POA: Insufficient documentation

## 2013-02-28 DIAGNOSIS — S39012A Strain of muscle, fascia and tendon of lower back, initial encounter: Secondary | ICD-10-CM

## 2013-02-28 DIAGNOSIS — Y92410 Unspecified street and highway as the place of occurrence of the external cause: Secondary | ICD-10-CM | POA: Insufficient documentation

## 2013-02-28 MED ORDER — DIAZEPAM 5 MG PO TABS: 5.0000 mg | ORAL_TABLET | Freq: Three times a day (TID) | ORAL | Status: DC | PRN

## 2013-02-28 MED ORDER — IBUPROFEN 600 MG PO TABS: 600.0000 mg | ORAL_TABLET | Freq: Four times a day (QID) | ORAL | Status: DC | PRN
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
## 2013-02-28 NOTE — Progress Notes (Signed)
[facility] CL provided pt with a list of primary care resources and ACA information. Patient stated that she did not know if she was on her parents insurance or not.
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
## 2013-02-28 NOTE — ED Provider Notes (Addendum)
Medical screening examination/treatment/procedure(s) were performed by non-physician practitioner and as supervising physician I was immediately available for consultation/collaboration.  EKG Interpretation   None         Shanna CiscoMegan E Docherty, MD 02/28/13 1749  Shanna CiscoMegan E Docherty, MD 03/18/13 [phone]
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
## 2013-03-28 ENCOUNTER — Inpatient Hospital Stay (HOSPITAL_COMMUNITY)
Admission: AD | Admit: 2013-03-28 | Discharge: 2013-03-28 | Disposition: A | Discharge status: Home / Self Care | Payer: Medicaid Other | Source: Ambulatory Visit | Attending: Obstetrics & Gynecology | Admitting: Obstetrics & Gynecology

## 2013-03-28 ENCOUNTER — Encounter (HOSPITAL_COMMUNITY): Payer: Self-pay | Admitting: *Deleted

## 2013-03-28 DIAGNOSIS — O039 Complete or unspecified spontaneous abortion without complication: Secondary | ICD-10-CM | POA: Insufficient documentation

## 2013-03-28 DIAGNOSIS — O4690 Antepartum hemorrhage, unspecified, unspecified trimester: Secondary | ICD-10-CM

## 2013-03-28 DIAGNOSIS — O200 Threatened abortion: Secondary | ICD-10-CM

## 2013-03-28 LAB — URINALYSIS, ROUTINE W REFLEX MICROSCOPIC
BILIRUBIN URINE: NEGATIVE
Glucose, UA: NEGATIVE mg/dL
Ketones, ur: NEGATIVE mg/dL
Leukocytes, UA: NEGATIVE
Nitrite: NEGATIVE
PH: 6.5 (ref 5.0–8.0)
Protein, ur: NEGATIVE mg/dL
UROBILINOGEN UA: 0.2 mg/dL (ref 0.0–1.0)

## 2013-03-28 LAB — CBC
HCT: 39 % (ref 36.0–46.0)
HEMOGLOBIN: 12.6 g/dL (ref 12.0–15.0)
MCH: 26.2 pg (ref 26.0–34.0)
MCHC: 32.3 g/dL (ref 30.0–36.0)
MCV: 81.1 fL (ref 78.0–100.0)
PLATELETS: 252 10*3/uL (ref 150–400)
RBC: 4.81 MIL/uL (ref 3.87–5.11)
RDW: 13.2 % (ref 11.5–15.5)
WBC: 6.3 10*3/uL (ref 4.0–10.5)

## 2013-03-28 LAB — WET PREP, GENITAL
Trich, Wet Prep: NONE SEEN
YEAST WET PREP: NONE SEEN

## 2013-03-28 LAB — POCT PREGNANCY, URINE: Preg Test, Ur: NEGATIVE

## 2013-03-28 LAB — HCG, QUANTITATIVE, PREGNANCY: HCG, BETA CHAIN, QUANT, S: 101 m[IU]/mL — AB (ref <5)

## 2013-03-28 LAB — URINE MICROSCOPIC-ADD ON

## 2013-03-28 NOTE — MAU Note (Signed)
Having cramps, but not painful

## 2013-03-28 NOTE — MAU Provider Note (Signed)
Attestation of Attending Supervision of Advanced Practitioner ([name]/NP): Evaluation and management procedures were performed by the Advanced Practitioner under my supervision and collaboration. I have reviewed the Advanced Practitioner's note and chart, and I agree with the management and plan.  Griff Badley H. 5:15 PM

## 2013-03-28 NOTE — MAU Note (Signed)
Took a preg test, + home test past 2 days, woke up this morning bleeding.

## 2013-03-28 NOTE — Progress Notes (Signed)
Jessica CarbonJennifer [name] NP in earlier to discuss test results and d/c plan. Written and verbal d/c instructions given and understanding voiced. Will return in 48hours for repeat lab

## 2013-03-28 NOTE — MAU Provider Note (Signed)
History     CSN: [id number]  Arrival date and time: 03/28/13 1126   First Provider Initiated Contact with Patient 03/28/13 1208      Chief Complaint  Patient presents with  . Vaginal Bleeding  . Possible Pregnancy   HPI  Ms. Jessica Lutz is a 28 y.o. female presents to MAU with possible pregnancy and new onset vaginal bleeding. LMP: January 26, 2013.  The patient had two home pregnancy tests yesterday and the day prior. She woke up this morning with bright red vaginal bleeding; heavy like a period. She currently denies pain.   OB History   Grav Para Term Preterm Abortions TAB SAB Ect Mult Living   3 1 1  1  1   1       Past Medical History  Diagnosis Date  . No pertinent past medical history     Past Surgical History  Procedure Laterality Date  . No past surgeries      Family History  Problem Relation Age of Onset  . Alcohol abuse Neg Hx     History  Substance Use Topics  . Smoking status: Never Smoker   . Smokeless tobacco: Never Used  . Alcohol Use: No    Allergies: No Known Allergies  Prescriptions prior to admission  Medication Sig Dispense Refill  . diazepam (VALIUM) 5 MG tablet Take 1 tablet (5 mg total) by mouth every 8 (eight) hours as needed (muscle spasm or pain).  8 tablet  0  . ibuprofen (ADVIL,MOTRIN) 600 MG tablet Take 1 tablet (600 mg total) by mouth every 6 (six) hours as needed.  15 tablet  0   Results for orders placed during the hospital encounter of 03/28/13 (from the past 48 hour(s))  URINALYSIS, ROUTINE W REFLEX MICROSCOPIC     Status: Abnormal   Collection Time    03/28/13 11:35 AM      Result Value Ref Range   Color, Urine YELLOW  YELLOW   APPearance CLEAR  CLEAR   Specific Gravity, Urine <1.005 (*) 1.005 - 1.030   pH 6.5  5.0 - 8.0   Glucose, UA NEGATIVE  NEGATIVE mg/dL   Hgb urine dipstick SMALL (*) NEGATIVE   Bilirubin Urine NEGATIVE  NEGATIVE   Ketones, ur NEGATIVE  NEGATIVE mg/dL   Protein, ur NEGATIVE  NEGATIVE mg/dL    Urobilinogen, UA 0.2  0.0 - 1.0 mg/dL   Nitrite NEGATIVE  NEGATIVE   Leukocytes, UA NEGATIVE  NEGATIVE  URINE MICROSCOPIC-ADD ON     Status: None   Collection Time    03/28/13 11:35 AM      Result Value Ref Range   Squamous Epithelial / LPF RARE  RARE   RBC / HPF 7-10  <3 RBC/hpf  POCT PREGNANCY, URINE     Status: None   Collection Time    03/28/13 11:43 AM      Result Value Ref Range   Preg Test, Ur NEGATIVE  NEGATIVE   Comment:            THE SENSITIVITY OF THIS     METHODOLOGY IS >24 mIU/mL  CBC     Status: None   Collection Time    03/28/13 11:46 AM      Result Value Ref Range   WBC 6.3  4.0 - 10.5 K/uL   RBC 4.81  3.87 - 5.11 MIL/uL   Hemoglobin 12.6  12.0 - 15.0 g/dL   HCT 40.9  81.1 - 91.4 %  MCV 81.1  78.0 - 100.0 fL   MCH 26.2  26.0 - 34.0 pg   MCHC 32.3  30.0 - 36.0 g/dL   RDW 29.4  76.5 - 46.5 %   Platelets 252  150 - 400 K/uL  HCG, QUANTITATIVE, PREGNANCY     Status: Abnormal   Collection Time    03/28/13 11:46 AM      Result Value Ref Range   hCG, Beta Chain, Quant, S 101 (*) <5 mIU/mL   Comment:              GEST. AGE      CONC.  (mIU/mL)       <=1 WEEK        5 - 50         2 WEEKS       50 - 500         3 WEEKS       100 - 10,000         4 WEEKS     1,000 - 30,000         5 WEEKS     3,500 - 115,000       6-8 WEEKS     12,000 - 270,000        12 WEEKS     15,000 - 220,000                FEMALE AND NON-PREGNANT FEMALE:         LESS THAN 5 mIU/mL  WET PREP, GENITAL     Status: Abnormal   Collection Time    03/28/13 12:20 PM      Result Value Ref Range   Yeast Wet Prep HPF POC NONE SEEN  NONE SEEN   Trich, Wet Prep NONE SEEN  NONE SEEN   Clue Cells Wet Prep HPF POC FEW (*) NONE SEEN   WBC, Wet Prep HPF POC FEW (*) NONE SEEN   Comment: MODERATE BACTERIA SEEN     Review of Systems  Constitutional: Negative for fever and chills.  Gastrointestinal: Negative for nausea, vomiting, abdominal pain, diarrhea and constipation.  Genitourinary:  Negative for dysuria, urgency, frequency and hematuria.       No vaginal discharge. + vaginal bleeding; heavy like her menstrual cycle  No dysuria.    Physical Exam   Blood pressure 127/82, pulse 78, temperature 98.2 F (36.8 C), temperature source Oral, resp. rate 18, height 5' 6.25" (1.683 m), weight 92.08 kg (203 lb), last menstrual period 01/26/2013.  Physical Exam  Constitutional: She is oriented to person, place, and time. She appears well-developed and well-nourished. No distress.  HENT:  Head: Normocephalic.  Eyes: Pupils are equal, round, and reactive to light.  Neck: Neck supple.  Respiratory: Effort normal.  GI: Soft. She exhibits no distension and no mass. There is no tenderness. There is no rebound and no guarding.  Genitourinary:  Speculum exam: Vagina - Small amount of dark red blood in vaginal canal, no odor Cervix - small-moderate active bleeding; quarter size, tissue like appearance noted at cervical os.  Bimanual exam: Cervix slightly open, able to insert one finger in the os.  Uterus non tender, normal size Adnexa non tender, no masses bilaterally GC/Chlam, wet prep done Chaperone present for exam.   Neurological: She is alert and oriented to person, place, and time.  Skin: Skin is warm. She is not diaphoretic.  Psychiatric: Her behavior is normal.    MAU Course  Procedures None  MDM Urine pregnancy test was negative in MAU; beta hcg level ordered  CBC A positive blood type Discussed patient with Dr. Penne LashLeggett; ok to have patient return in 48 hours for repeat beta Hcg. Given physical exam, this is likely an SAB in process. At discharge patient denied pain.   Assessment and Plan   A:  1. Threatened miscarriage in early pregnancy   2. Vaginal bleeding in pregnancy; likely SAB in process    P:  Discharge home in stable condition Discussed ectopic precautions; pt agrees to return with any pain or increased bleeding Return in 48 hours for repeat  beta hcg level  Bleeding precautions discussed Support given   Iona HansenJennifer Irene [name], NP  03/28/2013, 5:12 PM

## 2013-03-28 NOTE — Discharge Instructions (Signed)
Threatened Miscarriage °Bleeding during the first 20 weeks of pregnancy is common. This is sometimes called a threatened miscarriage. This is a pregnancy that is threatening to end before the twentieth week of pregnancy. Often this bleeding stops with bed rest or decreased activities as suggested by your caregiver and the pregnancy continues without any more problems. You may be asked to not have sexual intercourse, have orgasms or use tampons until further notice. Sometimes a threatened miscarriage can progress to a complete or incomplete miscarriage. This may or may not require further treatment. Some miscarriages occur before a woman misses a menstrual period and knows she is pregnant. °Miscarriages occur in 15 to 20% of all pregnancies and usually occur during the first 13 weeks of the pregnancy. The exact cause of a miscarriage is usually never known. A miscarriage is natures way of ending a pregnancy that is abnormal or would not make it to term. There are some things that may put you at risk to have a miscarriage, such as: °· Hormone problems. °· Infection of the uterus or cervix. °· Chronic illness, diabetes for example, especially if it is not controlled. °· Abnormal shaped uterus. °· Fibroids in the uterus. °· Incompetent cervix (the cervix is too weak to hold the baby). °· Smoking. °· Drinking too much alcohol. It's best not to drink any alcohol when you are pregnant. °· Taking illegal drugs. °TREATMENT  °When a miscarriage becomes complete and all products of conception (all the tissue in the uterus) have been passed, often no treatment is needed. If you think you passed tissue, save it in a container and take it to your doctor for evaluation. If the miscarriage is incomplete (parts of the fetus or placenta remain in the uterus), further treatment may be needed. The most common reason for further treatment is continued bleeding (hemorrhage) because pregnancy tissue did not pass out of the uterus. This  often occurs if a miscarriage is incomplete. Tissue left behind may also become infected. Treatment usually is dilatation and curettage (the removal of the remaining products of pregnancy. This can be done by a simple sucking procedure (suction curettage) or a simple scraping of the inside of the uterus. This may be done in the hospital or in the caregiver's office. This is only done when your caregiver knows that there is no chance for the pregnancy to proceed to term. This is determined by physical examination, negative pregnancy test, falling pregnancy hormone count and/or, an ultrasound revealing a dead fetus. °Miscarriages are often a very emotional time for prospective mothers and fathers. This is not you or your partners fault. It did not occur because of an inadequacy in you or your partner. Nearly all miscarriages occur because the pregnancy has started off wrongly. At least half of these pregnancies have a chromosomal abnormality. It is almost always not inherited. Others may have developmental problems with the fetus or placenta. This does not always show up even when the products miscarried are studied under the microscope. The miscarriage is nearly always not your fault and it is not likely that you could have prevented it from happening. If you are having emotional and grieving problems, talk to your health care provider and even seek counseling, if necessary, before getting pregnant again. You can begin trying for another pregnancy as soon as your caregiver says it is OK. °HOME CARE INSTRUCTIONS  °· Your caregiver may order bed rest depending on how much bleeding and cramping you are having. You may be limited   to only getting up to go to the bathroom. You may be allowed to continue light activity. You may need to make arrangements for the care of your other children and for any other responsibilities. °· Keep track of the number of pads you use each day, how often you have to change pads and how  saturated (soaked) they are. Record this information. °· DO NOT USE TAMPONS. Do not douche, have sexual intercourse or orgasms until approved by your caregiver. °· You may receive a follow up appointment for re-evaluation of your pregnancy and a repeat blood test. Re-evaluation often occurs after 2 days and again in 4 to 6 weeks. It is very important that you follow-up in the recommended time period. °· If you are Rh negative and the father is Rh positive or you do not know the fathers' blood type, you may receive a shot (Rh immune globulin) to help prevent abnormal antibodies that can develop and affect the baby in any future pregnancies. °SEEK IMMEDIATE MEDICAL CARE IF: °· You have severe cramps in your stomach, back, or abdomen. °· You have a sudden onset of severe pain in the lower part of your abdomen. °· You develop chills. °· You run an unexplained temperature of 101° F (38.3° C) or higher. °· You pass large clots or tissue. Save any tissue for your caregiver to inspect. °· Your bleeding increases or you become light-headed, weak, or have fainting episodes. °· You have a gush of fluid from your vagina. °· You pass out. This could mean you have a tubal (ectopic) pregnancy. °Document Released: 01/26/2005 Document Revised: 04/20/2011 Document Reviewed: 09/12/2007 °ExitCare® Patient Information ©2014 [employer]. ° °Vaginal Bleeding During Pregnancy °A small amount of bleeding from the vagina can happen anytime during pregnancy. It usually stops on its own. However, some bleeding can be serious. Be sure to tell your doctor about all vaginal bleeding. °HOME CARE °· Get plenty of rest and sleep. °· Stay in bed and only get up to go to the bathroom as told by your doctor. °· Write down the number of pads you use each day. Note how soaked they are. °· Do not use tampons. Do not clean the vagina with a stream of water (douche). °· Do not have sex (intercourse) or put anything into your vagina. Have this approved by  your doctor. °· Save any tissue that comes from your vagina. Show it to your doctor. °· Only take medicine as told by your doctor. °· Follow your doctor's advice about lifting, driving, and physical activity. °GET HELP RIGHT AWAY IF:  °· You feel your baby move less or not at all. °· You pass out (faint) while going to the bathroom. °· You have more bleeding. °· You start to have contractions. °· You have severe cramps in your stomach, back, or belly (abdomen). °· You are leaking fluid or have a gush of fluid from your vagina. °· You become lightheaded or weak. °· You have chills. °· You have clumps of tissue or blood clots coming from your vagina. °· You have a fever. °MAKE SURE YOU:  °· Understand these instructions. °· Will watch your condition. °· Will get help right away if you are not doing well or get worse. °Document Released: 11/05/2007 Document Revised: 01/13/2012 Document Reviewed: 11/16/2011 °ExitCare® Patient Information ©2014 [employer]. ° °

## 2013-03-29 LAB — GC/CHLAMYDIA PROBE AMP
CT Probe RNA: NEGATIVE
GC Probe RNA: NEGATIVE

## 2013-03-30 ENCOUNTER — Inpatient Hospital Stay (HOSPITAL_COMMUNITY)
Admission: AD | Admit: 2013-03-30 | Discharge: 2013-03-30 | Disposition: A | Discharge status: Home / Self Care | Payer: Self-pay | Source: Ambulatory Visit | Attending: Obstetrics & Gynecology | Admitting: Obstetrics & Gynecology

## 2013-03-30 DIAGNOSIS — O200 Threatened abortion: Secondary | ICD-10-CM | POA: Insufficient documentation

## 2013-03-30 DIAGNOSIS — O039 Complete or unspecified spontaneous abortion without complication: Secondary | ICD-10-CM

## 2013-03-30 DIAGNOSIS — O034 Incomplete spontaneous abortion without complication: Secondary | ICD-10-CM

## 2013-03-30 LAB — HCG, QUANTITATIVE, PREGNANCY: hCG, Beta Chain, Quant, S: 46 m[IU]/mL — ABNORMAL HIGH (ref <5)

## 2013-03-30 NOTE — MAU Provider Note (Signed)
  History     CSN: [id number]  Arrival date and time: 03/30/13 1209   None     Chief Complaint  Patient presents with  . Follow-up   HPI This is a 28 y.o. female at 5150w0d by LMP who presents for followup HCG level. She was seen 2 days ago for bleeding and her Quant HCG was 101.  Bleeding has lessened. Denies pain today. OB History   Grav Para Term Preterm Abortions TAB SAB Ect Mult Living   4 1 1  1  1   1       Past Medical History  Diagnosis Date  . No pertinent past medical history     Past Surgical History  Procedure Laterality Date  . No past surgeries      Family History  Problem Relation Age of Onset  . Alcohol abuse Neg Hx     History  Substance Use Topics  . Smoking status: Never Smoker   . Smokeless tobacco: Never Used  . Alcohol Use: No    Allergies: No Known Allergies  Prescriptions prior to admission  Medication Sig Dispense Refill  . diazepam (VALIUM) 5 MG tablet Take 1 tablet (5 mg total) by mouth every 8 (eight) hours as needed (muscle spasm or pain).  8 tablet  0    Review of Systems  Constitutional: Negative for fever and chills.  Gastrointestinal: Negative for nausea, vomiting and abdominal pain.  Genitourinary:       Light bleeding  Neurological: Negative for dizziness and headaches.   Physical Exam   Blood pressure 113/73, pulse 78, temperature 98.9 F (37.2 C), temperature source Oral, resp. rate 18, last menstrual period 01/26/2013.  Physical Exam  Constitutional: She is oriented to person, place, and time. She appears well-developed and well-nourished.  HENT:  Head: Normocephalic.  Cardiovascular: Normal rate.   Respiratory: Effort normal.  GI: Soft. There is no tenderness.  Musculoskeletal: Normal range of motion.  Neurological: She is alert and oriented to person, place, and time.  Skin: Skin is warm and dry.  Psychiatric: She has a normal mood and affect.    MAU Course  Procedures  MDM Results for orders placed  during the hospital encounter of 03/30/13 (from the past 24 hour(s))  HCG, QUANTITATIVE, PREGNANCY     Status: Abnormal   Collection Time    03/30/13 12:30 PM      Result Value Ref Range   hCG, Beta Chain, Quant, S 46 (*) <5 mIU/mL     Assessment and Plan  A:  Pregnancy at 3650w0d       Inevitable SAB      Declining quants  P:  Discussed findings with patient       Will do one more Quant in clinic next Wednesday at 0800.         [facility] Center[facility]ILLIAMS,Abria Vannostrand 03/30/2013, 1:37 PM

## 2013-03-30 NOTE — Discharge Instructions (Signed)
Miscarriage A miscarriage is the sudden loss of an unborn baby (fetus) before the 20th week of pregnancy. Most miscarriages happen in the first 3 months of pregnancy. Sometimes, it happens before a woman even knows she is pregnant. A miscarriage is also called a "spontaneous miscarriage" or "early pregnancy loss." Having a miscarriage can be an emotional experience. Talk with your caregiver about any questions you may have about miscarrying, the grieving process, and your future pregnancy plans. CAUSES   Problems with the fetal chromosomes that make it impossible for the baby to develop normally. Problems with the baby's genes or chromosomes are most often the result of errors that occur, by chance, as the embryo divides and grows. The problems are not inherited from the parents.  Infection of the cervix or uterus.   Hormone problems.   Problems with the cervix, such as having an incompetent cervix. This is when the tissue in the cervix is not strong enough to hold the pregnancy.   Problems with the uterus, such as an abnormally shaped uterus, uterine fibroids, or congenital abnormalities.   Certain medical conditions.   Smoking, drinking alcohol, or taking illegal drugs.   Trauma.  Often, the cause of a miscarriage is unknown.  SYMPTOMS   Vaginal bleeding or spotting, with or without cramps or pain.  Pain or cramping in the abdomen or lower back.  Passing fluid, tissue, or blood clots from the vagina. DIAGNOSIS  Your caregiver will perform a physical exam. You may also have an ultrasound to confirm the miscarriage. Blood or urine tests may also be ordered. TREATMENT   Sometimes, treatment is not necessary if you naturally pass all the fetal tissue that was in the uterus. If some of the fetus or placenta remains in the body (incomplete miscarriage), tissue left behind may become infected and must be removed. Usually, a dilation and curettage (D and C) procedure is performed.  During a D and C procedure, the cervix is widened (dilated) and any remaining fetal or placental tissue is gently removed from the uterus.  Antibiotic medicines are prescribed if there is an infection. Other medicines may be given to reduce the size of the uterus (contract) if there is a lot of bleeding.  If you have Rh negative blood and your baby was Rh positive, you will need a Rh immunoglobulin shot. This shot will protect any future baby from having Rh blood problems in future pregnancies. HOME CARE INSTRUCTIONS   Your caregiver may order bed rest or may allow you to continue light activity. Resume activity as directed by your caregiver.  Have someone help with home and family responsibilities during this time.   Keep track of the number of sanitary pads you use each day and how soaked (saturated) they are. Write down this information.   Do not use tampons. Do not douche or have sexual intercourse until approved by your caregiver.   Only take over-the-counter or prescription medicines for pain or discomfort as directed by your caregiver.   Do not take aspirin. Aspirin can cause bleeding.   Keep all follow-up appointments with your caregiver.   If you or your partner have problems with grieving, talk to your caregiver or seek counseling to help cope with the pregnancy loss. Allow enough time to grieve before trying to get pregnant again.  SEEK IMMEDIATE MEDICAL CARE IF:   You have severe cramps or pain in your back or abdomen.  You have a fever.  You pass large blood clots (walnut-sized   or larger) ortissue from your vagina. Save any tissue for your caregiver to inspect.   Your bleeding increases.   You have a thick, bad-smelling vaginal discharge.  You become lightheaded, weak, or you faint.   You have chills.  MAKE SURE YOU:  Understand these instructions.  Will watch your condition.  Will get help right away if you are not doing well or get  worse. Document Released: 07/22/2000 Document Revised: 05/23/2012 Document Reviewed: 03/17/2011 ExitCare Patient Information 2014 [employer].  

## 2013-03-30 NOTE — MAU Note (Signed)
Here for repeat BHCG.  Denies pain. Is bleeding, kind of like a period.

## 2013-03-30 NOTE — MAU Provider Note (Signed)
Attestation of Attending Supervision of Advanced Practitioner (PA/[name]/NP): Evaluation and management procedures were performed by the Advanced Practitioner under my supervision and collaboration.  I have reviewed the Advanced Practitioner's note and chart, and I agree with the management and plan.  Ayman Brull, MD, FACOG Attending Obstetrician & Gynecologist Faculty Practice, [facility]  

## 2013-04-05 ENCOUNTER — Other Ambulatory Visit: Payer: Self-pay

## 2013-06-14 ENCOUNTER — Encounter (HOSPITAL_COMMUNITY): Payer: Self-pay | Admitting: Emergency Medicine

## 2013-06-14 ENCOUNTER — Emergency Department (HOSPITAL_COMMUNITY)
Admission: EM | Admit: 2013-06-14 | Discharge: 2013-06-15 | Disposition: A | Discharge status: Home / Self Care | Payer: Medicaid Other | Attending: Emergency Medicine | Admitting: Emergency Medicine

## 2013-06-14 DIAGNOSIS — T398X2A Poisoning by other nonopioid analgesics and antipyretics, not elsewhere classified, intentional self-harm, initial encounter: Secondary | ICD-10-CM

## 2013-06-14 DIAGNOSIS — F329 Major depressive disorder, single episode, unspecified: Secondary | ICD-10-CM | POA: Insufficient documentation

## 2013-06-14 DIAGNOSIS — T39314A Poisoning by propionic acid derivatives, undetermined, initial encounter: Secondary | ICD-10-CM | POA: Insufficient documentation

## 2013-06-14 DIAGNOSIS — T50901A Poisoning by unspecified drugs, medicaments and biological substances, accidental (unintentional), initial encounter: Secondary | ICD-10-CM

## 2013-06-14 DIAGNOSIS — T394X1A Poisoning by antirheumatics, not elsewhere classified, accidental (unintentional), initial encounter: Secondary | ICD-10-CM | POA: Insufficient documentation

## 2013-06-14 DIAGNOSIS — Y929 Unspecified place or not applicable: Secondary | ICD-10-CM | POA: Insufficient documentation

## 2013-06-14 DIAGNOSIS — F3289 Other specified depressive episodes: Secondary | ICD-10-CM | POA: Insufficient documentation

## 2013-06-14 DIAGNOSIS — Y9389 Activity, other specified: Secondary | ICD-10-CM | POA: Insufficient documentation

## 2013-06-14 DIAGNOSIS — T394X2A Poisoning by antirheumatics, not elsewhere classified, intentional self-harm, initial encounter: Secondary | ICD-10-CM | POA: Insufficient documentation

## 2013-06-14 DIAGNOSIS — Z3202 Encounter for pregnancy test, result negative: Secondary | ICD-10-CM | POA: Insufficient documentation

## 2013-06-14 LAB — CBC
HEMATOCRIT: 41.3 % (ref 36.0–46.0)
HEMOGLOBIN: 13.6 g/dL (ref 12.0–15.0)
MCH: 27.5 pg (ref 26.0–34.0)
MCHC: 32.9 g/dL (ref 30.0–36.0)
MCV: 83.4 fL (ref 78.0–100.0)
Platelets: 291 10*3/uL (ref 150–400)
RBC: 4.95 MIL/uL (ref 3.87–5.11)
RDW: 13 % (ref 11.5–15.5)
WBC: 6.9 10*3/uL (ref 4.0–10.5)

## 2013-06-14 LAB — COMPREHENSIVE METABOLIC PANEL
ALT: 13 U/L (ref 0–35)
AST: 15 U/L (ref 0–37)
Albumin: 4 g/dL (ref 3.5–5.2)
Alkaline Phosphatase: 96 U/L (ref 39–117)
BILIRUBIN TOTAL: 0.7 mg/dL (ref 0.3–1.2)
BUN: 9 mg/dL (ref 6–23)
CHLORIDE: 105 meq/L (ref 96–112)
CO2: 25 mEq/L (ref 19–32)
CREATININE: 0.87 mg/dL (ref 0.50–1.10)
Calcium: 9.7 mg/dL (ref 8.4–10.5)
GFR calc non Af Amer: >90 mL/min (ref >90)
GLUCOSE: 89 mg/dL (ref 70–99)
POTASSIUM: 3.6 meq/L — AB (ref 3.7–5.3)
Sodium: 141 mEq/L (ref 137–147)
Total Protein: 8 g/dL (ref 6.0–8.3)

## 2013-06-14 LAB — ACETAMINOPHEN LEVEL: Acetaminophen (Tylenol), Serum: <15 ug/mL (ref 10–30)

## 2013-06-14 LAB — RAPID URINE DRUG SCREEN, HOSP PERFORMED
Amphetamines: NOT DETECTED
BARBITURATES: NOT DETECTED
Benzodiazepines: NOT DETECTED
Cocaine: NOT DETECTED
Opiates: NOT DETECTED
Tetrahydrocannabinol: POSITIVE — AB

## 2013-06-14 LAB — POC URINE PREG, ED: Preg Test, Ur: NEGATIVE

## 2013-06-14 LAB — ETHANOL

## 2013-06-14 LAB — SALICYLATE LEVEL: Salicylate Lvl: <2 mg/dL — ABNORMAL LOW (ref 2.8–20.0)

## 2013-06-14 MED ORDER — ONDANSETRON HCL 4 MG PO TABS: 4.0000 mg | ORAL_TABLET | Freq: Three times a day (TID) | ORAL | Status: DC | PRN

## 2013-06-14 MED ORDER — NICOTINE 21 MG/24HR TD PT24
21.0000 mg | MEDICATED_PATCH | Freq: Every day | TRANSDERMAL | Status: DC
Start: 2013-06-15 — End: 2013-06-15

## 2013-06-14 MED ORDER — ACETAMINOPHEN 325 MG PO TABS: 650.0000 mg | ORAL_TABLET | ORAL | Status: DC | PRN

## 2013-06-14 MED ORDER — ZOLPIDEM TARTRATE 5 MG PO TABS: 5.0000 mg | ORAL_TABLET | Freq: Every evening | ORAL | Status: DC | PRN

## 2013-06-14 MED ORDER — LORAZEPAM 1 MG PO TABS: 1.0000 mg | ORAL_TABLET | Freq: Three times a day (TID) | ORAL | Status: DC | PRN

## 2013-06-14 NOTE — ED Notes (Signed)
Patient originally denied SI, then stated to [employer] that she took them intentionally, and is now stating that she did not want to hurt herself and wants to leave. IVC paperwork in process.

## 2013-06-14 NOTE — ED Notes (Signed)
[facility]-pt reports swallowing one handful of Ibuprofen.

## 2013-06-14 NOTE — ED Notes (Signed)
RN spoke to [place] [employer]. Per Poison control- "If patient displays any symptoms it will most likely be nausea, vomiting and abdominal pain. However if patient took more than stated patient is at risk for metabolic acidosis." Poison control recommends 4 hour observation with metabolic panel checks. MD notified.

## 2013-06-14 NOTE — ED Notes (Addendum)
[employer] notified of sitter.  Security notified.  Patient is sitting in Triage 4 insight of nurse and tech.  Patient awaiting sitter
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
## 2013-06-14 NOTE — ED Notes (Signed)
Patient told officers it was not accidental overdose.

## 2013-06-15 ENCOUNTER — Encounter (HOSPITAL_COMMUNITY): Payer: Self-pay | Admitting: Emergency Medicine

## 2013-06-15 MED ORDER — ZIPRASIDONE MESYLATE 20 MG IM SOLR
20.0000 mg | Freq: Once | INTRAMUSCULAR | Status: AC
  Administered 2013-06-15: 20 mg via INTRAMUSCULAR
  Filled 2013-06-15: qty 20

## 2013-06-15 MED ORDER — LORAZEPAM 2 MG/ML IJ SOLN
1.0000 mg | Freq: Once | INTRAMUSCULAR | Status: AC
  Administered 2013-06-15: 1 mg via INTRAMUSCULAR
  Filled 2013-06-15: qty 1

## 2013-06-15 MED ORDER — STERILE WATER FOR INJECTION IJ SOLN
INTRAMUSCULAR | Status: AC
  Administered 2013-06-15: 1.2 mL
  Filled 2013-06-15: qty 10

## 2013-06-15 NOTE — ED Notes (Addendum)
Pt walking towards exit doors, [employer] and security to pt and asked pt to turn around, pt did not turn around. [employer] stopped pt.  Pt screaming, kicking, pushing against [employer], pt c[facility]ing at [facility] and staff. Pt extremely combative towards staff and [employer], pt rolling around on floor. Dr Dierdre Highmanpitz to see pt and orders received for chemical restraints. Pt continually agitated. Staff attempted to explain rules and policies to pt - pt refused to listen and still resisting [employer].

## 2013-06-15 NOTE — ED Notes (Signed)
Pt became combative to staff. [employer] and staff members attempted to talk calmly to patient, tried to use therapeutic calm conversation to calm her and encourage her to cooperate. Pt would not listen and began screaming, cursing and resisting [employer] and staff help. Pt cursing loudly and kicking legs and arms to staff and [employer]. I tried to speak calmly to patient and encourage her to calm down and reassured her that we just want to help her and we are just looking our for her safety. Pt remains combative.

## 2013-06-15 NOTE — ED Notes (Signed)
Pt screaming in hallway. [employer] escorted patient back to room.

## 2013-06-15 NOTE — Progress Notes (Signed)
CSW consult to pt regarding visit to ED. Pt reported that she has been under a great deal of stress and "it got the Tres Grzywacz of me". Pt reported although she took Ibuprofen she was not trying to hurt herself and felt the situation got out of control. Pt denies, SI/HI/AVH. Pt asked CSW when could she discharge. [facility] with pt [facility] and [facility] are awaiting an answer from an extender. Pt voiced understanding. Pt reported she would like to leave because she has a 3 daughter that is in her care. Pt reported no history of SI or SA. Pt declined resources. Awaiting disposition.   [address]Jacqualin Shirkey, [place]CSWA [phone]

## 2013-06-15 NOTE — ED Notes (Signed)
Spoke with BH - telepsych will happen after 7a.

## 2013-06-15 NOTE — ED Notes (Signed)
Pt out at nurses station raising her voice about being here. Charge nurse RN speaking with patient. BH called once more, they state they are going in order of how the consults were put in. States there are people ahead of patient.
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
## 2013-06-15 NOTE — ED Notes (Signed)
Pt kicking chair and table in room while screaming and cussing. Pt will not stay in room. [employer], security, charge nurse at pts side.

## 2013-06-15 NOTE — ED Notes (Signed)
Pt appears to be calming down, crying and speaking softer to [employer]. RN using therapeutic communication and reassuring patient that we are just looking out for her best interests to get her the help she needs.

## 2013-06-15 NOTE — ED Provider Notes (Signed)
2:05 PM Patient awake and alert and calm. Psych has evaluated and she is displaying no SI, HI or aggression. They feel she can be discharged with outpatient resources. After evaluation I agree, she does not appear mentally ill and is not suicidal. Will d/c. IVC reversed  Audree CamelScott T Diogo Anne, MD 06/15/13 [phone]

## 2013-06-15 NOTE — Discharge Instructions (Signed)
Overdose, Adult  A person can overdose on alcohol, drugs or both by accident or on purpose. If it was on purpose, it is a serious matter. Professional help should be sought. If the overdose was an accident, certain steps should be taken to make sure that it never happens again.  ACCIDENTAL OVERDOSE  Overdosing on prescription medications can be a result of:  · Not understanding the instructions.  · Misreading the label.  · Forgetting that you took a dose and then taking another by mistake. This situation happens a lot.  To make sure this does not happen again:  · Clarify the correct dosage with your caregiver.  · Place the correct dosage in a "pill-minder" container (labeled for each day and time of day).  · Have someone dispense your medicine.  Please be sure to follow-up with your primary care doctor as directed.  INTENTIONAL OVERDOSE  If the overdose was on purpose, it is a serious situation. Taking more than the prescribed amount of medications (including taking someone else's prescription), abusing street drugs or drinking an amount of alcohol that requires medical treatment can show a variety of possible problems. These may indicate you:  · Are depressed or suicidal.  · Are abusing drugs, took too much or combined different drugs to experiment with the effects.  · Mixed alcohol with drugs and did not realize the danger of doing so (this is drug abuse).  · Are suffering addiction to drugs and/or alcohol (also known as chemical dependency).  · Binge drink.  If you have not been referred to a mental health professional for help, it is important that you get help right away. Only a professional can determine which problems may exist and what the best course of treatment may be. It is your responsibility to follow-up with further evaluation or treatment as directed.   Alcohol is responsible for a large number of overdoses and unintended deaths among college-age young adults. Binge drinking is consuming 4-5 drinks  in a short period of time. The amount of alcohol in standard servings of wine (5 oz.), beer (12 oz.) and distilled spirits (1.5 oz., 80 proof) is the same. Beer or wine can be just as dangerous to the binge drinker as "hard" liquor can be.   CONSEQUENCES OF BINGE DRINKING  Alcohol poisoning is the most serious consequence of binge drinking. This is a severe and potentially fatal physical reaction to an alcohol overdose. When too much alcohol is consumed, the brain does not get enough oxygen. The lack of oxygen will eventually cause the brain to shut down the voluntary functions that regulate breathing and heart rate. Symptoms of alcohol poisoning include:  · Vomiting.  · Passing out (unconsciousness).  · Cold, clammy, pale or bluish skin.  · Slow or irregular breathing.  WHAT SHOULD I DO NEXT?  If you have a history of drug abuse or suffer chemical dependency (alcoholism, drug addiction or both), you might consider the following:  · Talk with a qualified substance abuse counselor and consider entering a treatment program.  · Go to a detox facility if necessary.  · If you were attending self-help group meetings, consider returning to them and go often.  · Explore other resources located near you (see sources listed below).  If you are unsure if you have a substance abuse problem, ask yourself the following questions:  · Have you been told by friends or family that drugs/alcohol has become a problem?  · Do you get into fights   when drinking or using drugs?  · Do you have blackouts (not remembering what you do while using)?  · Do you lie about use or amounts of drugs or alcohol you consume?  · Do you need chemicals to get you going?  · Do you suffer in work or school performance because of drug or alcohol use?  · Do you get sick from drug or alcohol use but continue to use anyway?  · Do you need drugs or alcohol to relate to people or feel comfortable in social situations?  · Do you use drugs or alcohol to forget  problems?  If you answered "Yes" to any of the above questions, it means you show signs of chemical dependency and a professional evaluation is suggested. The longer the use of drugs and alcohol continues, the problems will become greater.  SEEK IMMEDIATE MEDICAL CARE IF:   · You feel like you might repeat your problematic behavior.  · You need someone to talk to and feel that it should not wait.  · You feel you are a danger to yourself or someone else.  · You feel like you are having a new reaction to medications you are taking, or you are getting worse after leaving a care center.  · You have an overwhelming urge to drink or use drugs.  Addiction cannot be cured, but it can be treated successfully. Treatment centers are listed in the yellow pages under: Cocaine, Narcotics, and Alcoholics Anonymous. Most hospitals and clinics can refer you to a specialized care center. The [place] government maintains a toll-free number for obtaining treatment referrals: 1-800-662-4357 or 1-800-487-4889 (TDD) and maintains a website: http://findtreatment.samhsa.gov. Other websites for additional information are: www.mentalhealth.samhsa.gov. and www.nida.gov.  In [place] treatment resources are listed in each Province with listings available under [facility] or similar titles.  Document Released: 01/29/2003 Document Revised: 04/20/2011 Document Reviewed: 12/21/2007  ExitCare® Patient Information ©2014 [employer] LLC.

## 2013-06-15 NOTE — ED Notes (Signed)
Pt at nurses station, asking to make phone calls to leave. Pt informed of phone hours. Pt not happy about ED policy. Charge RN paged to [employer] as patient grows loud and aggressive.

## 2013-06-15 NOTE — ED Notes (Signed)
Pt has agreed to sign a No Harm Contract and IVC rescind [facility] process [facility].
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
## 2013-06-15 NOTE — ED Notes (Signed)
Pt resting in bed. Appears to be sleeping.

## 2013-06-15 NOTE — ED Notes (Signed)
Pt sitting in chair at door stating "I am ready to fucking go. This is fucking ridiculous."

## 2013-06-15 NOTE — ED Notes (Signed)
Poison control called to check on patient.

## 2013-06-15 NOTE — ED Notes (Signed)
Charge Nurse spoke to patient, patient appeared to be very upset. Pt keeps saying "I want to go home, I shouldn't be here right now." Charge Nurse instructed patient politely that she was IVC and could not leave at this time. Pt continues to raise voice and yell. [employer] outside of room.

## 2013-06-15 NOTE — ED Notes (Signed)
Pt screaming at me stating "Get the fuck out of my room ugly ass bitch. I will fucking sue you. Do not fucking get in my face." At this point I left the room per patients request.

## 2013-06-15 NOTE — ED Notes (Signed)
Pt escorted back to room. Pt still screaming, cussing and uncooperative with staff and [employer].

## 2013-06-15 NOTE — ED Notes (Signed)
[employer] at nurses station. Pt not understanding IVC. Stating she has to go because she is neglecting her child.

## 2013-06-15 NOTE — ED Notes (Signed)
Pt to nurses station asking on update. Pt updated.

## 2013-06-15 NOTE — ED Notes (Signed)
Called TTS to notify pt was awake and fully alert for assessment.  This RN was told tele assessment would occur in about 10 minutes.  Advised pt of status.  Monitor placed in room.

## 2013-06-15 NOTE — ED Notes (Signed)
I used theraputic touch, placing my hand on patients L shoulder. Tried speaking calmly to patient "Jessica Lutz please calm down. Please lower your voice. We are just trying to help you no one is here to hurt you. We know you want to leave we are trying our best."  Pt was screaming over me and would not listen to me.

## 2013-06-15 NOTE — ED Notes (Signed)
PT at nurses desk cursing yelling stating she wants to fucking go home, stating "just give me the damn warrant and ill pay my bail." [employer] officer calmly explained to patient that is not how IVC works. [employer] officer calmly encouraged patient to lower voice and just be patient. Pt asked "what are you gonna do arrest me." [employer] officer explained that she would still be here for treatment and worse case she would be put in hand cuffs. [employer] reassured patient that he did not want it to come to that. Pt returned to room.

## 2013-06-15 NOTE — [facility] Counselor (Cosign Needed)
Pt faxed following resources to pt's RN for pt at d/c:  [facility] - free/county paid [facility] [address] [place], [place]  [address] [phone] or [phone] - [email or web address]  Psychiatrists [facility]   [facility] [address]    [address] [place], [place] [address]    [place] [address] [phone]      [phone]  Dr. Archer AsaGerald Plovsky     [facility] [address]    [address] [place] [address]    [place] [address] [phone]      [phone]  [facility]rapists [facility]    [facility] [address]   [address] [place]     [place], [place] [phone]      [phone]  [facility] [address]     [address] [place] [address]    [place] [phone]      [phone]   [facility] [address] [place] [address] [phone]  Mobile Crisis Teams [facility]    Assertive  Mobile Crisis Care Unit    [facility] Services [phone]     [address], [place], [place]        [phone] These referrals have been provided to you as appropriate for your clinical needs while taking into account your financial concerns. Please be aware that agencies, practitioners and insurance companies sometimes change contracts. When calling to make an appointment have your insurance information available so the professional you are going to see can confirm whether they are covered by your plan. Take this form with you in case the person you are seeing needs a copy or to contact us.  _________________________________________ Assessment Counselor  Evette Cristalaroline Paige Minola Guin, [place]WA Assessment Counselor
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
## 2013-06-15 NOTE — ED Notes (Signed)
Pt began walking to exit. Pt informed she has to be in her room or on this Pod. Pt refused to return to pod. Pt walking towards automatic ambulance doors exit. [employer] following patient encouraging her to return to room. Pt screaming and cursing loudly.

## 2013-06-15 NOTE — ED Notes (Signed)
Security called. Pt not understanding meaning of IVC. Pt informed she has to wait to be assessed and cleared by [facility], they were called and stated she had several people in line ahead of her. Pt informed.

## 2013-06-15 NOTE — ED Notes (Signed)
Bh called again to see if they could bump this patient for sooner eval. They will call back

## 2013-06-15 NOTE — ED Notes (Addendum)
Pt had Trish MageAnna RN, Denny LevyKachi RN and Cassie RN 1 on 1 from initiation of chemical restraint at [phone].

## 2013-06-15 NOTE — ED Notes (Signed)
Pt at nurses station talking to [employer], pt states "just give me the warrant because I have got to go." IVC explained to patient once again. Pt states she has a child and needs to go.

## 2013-06-16 NOTE — ED Provider Notes (Signed)
Medical screening examination/treatment/procedure(s) were performed by non-physician practitioner and as supervising physician I was immediately available for consultation/collaboration.   EKG Interpretation   Date/Time:  Wednesday Jun 14 2013 20:53:28 EDT Ventricular Rate:  81 PR Interval:  160 QRS Duration: 92 QT Interval:  370 QTC Calculation: 429 R Axis:   79 Text Interpretation:  Normal sinus rhythm Normal ECG No old tracing to  compare Confirmed by GOLDSTON  MD, SCOTT [phone] on 06/15/2013 7:50:43 AM        Jonny RuizJohn Mayo Aoavid Zarina Pe III, MD 06/16/13 1215

## 2013-09-30 ENCOUNTER — Encounter (HOSPITAL_COMMUNITY): Payer: Self-pay

## 2013-09-30 ENCOUNTER — Inpatient Hospital Stay (HOSPITAL_COMMUNITY)
Admission: AD | Admit: 2013-09-30 | Discharge: 2013-09-30 | Disposition: A | Discharge status: Home / Self Care | Payer: Medicaid Other | Source: Ambulatory Visit | Attending: Obstetrics & Gynecology | Admitting: Obstetrics & Gynecology

## 2013-09-30 DIAGNOSIS — G44219 Episodic tension-type headache, not intractable: Secondary | ICD-10-CM

## 2013-09-30 DIAGNOSIS — G44209 Tension-type headache, unspecified, not intractable: Secondary | ICD-10-CM | POA: Diagnosis not present

## 2013-09-30 DIAGNOSIS — R51 Headache: Secondary | ICD-10-CM | POA: Diagnosis present

## 2013-09-30 LAB — URINALYSIS, ROUTINE W REFLEX MICROSCOPIC
Bilirubin Urine: NEGATIVE
GLUCOSE, UA: NEGATIVE mg/dL
HGB URINE DIPSTICK: NEGATIVE
Ketones, ur: NEGATIVE mg/dL
Leukocytes, UA: NEGATIVE
Nitrite: NEGATIVE
Protein, ur: NEGATIVE mg/dL
SPECIFIC GRAVITY, URINE: 1.01 (ref 1.005–1.030)
Urobilinogen, UA: 0.2 mg/dL (ref 0.0–1.0)
pH: 6.5 (ref 5.0–8.0)

## 2013-09-30 LAB — POCT PREGNANCY, URINE: PREG TEST UR: NEGATIVE

## 2013-09-30 MED ORDER — IBUPROFEN 800 MG PO TABS
800.0000 mg | ORAL_TABLET | Freq: Once | ORAL | Status: AC
  Administered 2013-09-30: 800 mg via ORAL
  Filled 2013-09-30: qty 1

## 2013-09-30 MED ORDER — BUTALBITAL-APAP-CAFFEINE 50-325-40 MG PO TABS: 2.0000 | ORAL_TABLET | Freq: Four times a day (QID) | ORAL | Status: DC | PRN

## 2013-09-30 MED ORDER — METOCLOPRAMIDE HCL 10 MG PO TABS
10.0000 mg | ORAL_TABLET | Freq: Once | ORAL | Status: AC
  Administered 2013-09-30: 10 mg via ORAL
  Filled 2013-09-30: qty 1

## 2013-09-30 MED ORDER — BUTALBITAL-APAP-CAFFEINE 50-325-40 MG PO TABS
2.0000 | ORAL_TABLET | Freq: Once | ORAL | Status: DC
  Filled 2013-09-30: qty 2

## 2013-09-30 MED ORDER — BUTALBITAL-APAP-CAFFEINE 50-325-40 MG PO TABS
2.0000 | ORAL_TABLET | Freq: Once | ORAL | Status: AC
  Administered 2013-09-30: 2 via ORAL
  Filled 2013-09-30: qty 2

## 2013-09-30 NOTE — MAU Provider Note (Signed)
History     CSN: [id number]  Arrival date and time: 09/30/13 4098   First Provider Initiated Contact with Patient 09/30/13 [phone]      Chief Complaint  Patient presents with  . Headache   HPI Ms. Levonne JAVAEH MUSCATELLO is a 28 y.o. female [phone] who presents with complaints of a headache. She has a history of headaches and usually takes Excedrin. For the past 4-5 days she had tried Excedrin however it has not helped much. Denies photophobia, nausea or vomiting.   OB History   Grav Para Term Preterm Abortions TAB SAB Ect Mult Living   3 1 1  2  2   1       Past Medical History  Diagnosis Date  . No pertinent past medical history     Past Surgical History  Procedure Laterality Date  . No past surgeries      Family History  Problem Relation Age of Onset  . Alcohol abuse Neg Hx     History  Substance Use Topics  . Smoking status: Never Smoker   . Smokeless tobacco: Never Used  . Alcohol Use: No    Allergies: No Known Allergies  Prescriptions prior to admission  Medication Sig Dispense Refill  . aspirin-acetaminophen-caffeine (EXCEDRIN MIGRAINE) 250-250-65 MG per tablet Take 3 tablets by mouth every 6 (six) hours as needed for headache.       Results for orders placed during the hospital encounter of 09/30/13 (from the past 48 hour(s))  URINALYSIS, ROUTINE W REFLEX MICROSCOPIC     Status: None   Collection Time    09/30/13  7:48 AM      Result Value Ref Range   Color, Urine YELLOW  YELLOW   APPearance CLEAR  CLEAR   Specific Gravity, Urine 1.010  1.005 - 1.030   pH 6.5  5.0 - 8.0   Glucose, UA NEGATIVE  NEGATIVE mg/dL   Hgb urine dipstick NEGATIVE  NEGATIVE   Bilirubin Urine NEGATIVE  NEGATIVE   Ketones, ur NEGATIVE  NEGATIVE mg/dL   Protein, ur NEGATIVE  NEGATIVE mg/dL   Urobilinogen, UA 0.2  0.0 - 1.0 mg/dL   Nitrite NEGATIVE  NEGATIVE   Leukocytes, UA NEGATIVE  NEGATIVE   Comment: MICROSCOPIC NOT DONE ON URINES WITH NEGATIVE PROTEIN, BLOOD, LEUKOCYTES,  NITRITE, OR GLUCOSE <1000 mg/dL.  POCT PREGNANCY, URINE     Status: None   Collection Time    09/30/13  8:03 AM      Result Value Ref Range   Preg Test, Ur NEGATIVE  NEGATIVE   Comment:            THE SENSITIVITY OF THIS     METHODOLOGY IS >24 mIU/mL    Review of Systems  Gastrointestinal: Negative for nausea and vomiting.  Neurological: Positive for headaches.   Physical Exam   Blood pressure 123/80, pulse 86, temperature 98.5 F (36.9 C), temperature source Oral, resp. rate 18, height 5\' 5"  (1.651 m), weight 88.678 kg (195 lb 8 oz), last menstrual period 09/03/2013, not currently breastfeeding.  Physical Exam  Constitutional: She is oriented to person, place, and time. Vital signs are normal. She appears well-developed and well-nourished.  Non-toxic appearance. She does not have a sickly appearance. She does not appear ill. No distress.  HENT:  Head: Normocephalic.  Eyes: EOM are normal. Pupils are equal, round, and reactive to light.  Neck: Neck supple.  Cardiovascular: Normal rate.   Respiratory: Effort normal.  GI: Soft.  Musculoskeletal: Normal range  of motion.  Neurological: She is alert and oriented to person, place, and time. She has normal strength. GCS eye subscore is 4. GCS verbal subscore is 5. GCS motor subscore is 6.  Skin: Skin is warm. She is not diaphoretic.  Psychiatric: Her behavior is normal.    MAU Course  Procedures None  MDM Fioricet 2 tabs PO- pt states her ride left her in MAU; unable to take fioricet because she will be driving Ibuprofen 161800 mg po reglan 10 po  Patient states her ride is back and requests Fioricet. 2 tabs given in MAU  Assessment and Plan   A: 1. Episodic tension-type headache, not intractable    P: Discharge home in stable condition RX: Fioricet  If HA returns with no relief from medication, [facility]    Iona HansenJennifer Irene Quayshawn Nin, NP  09/30/2013, 11:31 AM

## 2013-09-30 NOTE — Discharge Instructions (Signed)
Tension Headache °A tension headache is pain, pressure, or aching felt over the front and sides of the head. Tension headaches often come after stress, feeling worried (anxiety), or feeling sad or down for a while (depressed). °HOME CARE °· Only take medicine as told by your doctor. °· Lie down in a dark, quiet room when you have a headache. °· Keep a journal to find out if certain things bring on headaches. For example, write down: °¨ What you eat and drink. °¨ How much sleep you get. °¨ Any change to your diet or medicines. °· Relax by getting a massage or doing other relaxing activities. °· Put ice or heat packs on the head and neck area as told by your doctor. °· Lessen stress. °· Sit up straight. Do not tighten (tense) your muscles. °· Quit smoking if you smoke. °· Lessen how much alcohol you drink. °· Lessen how much caffeine you drink, or stop drinking caffeine. °· Eat and exercise regularly. °· Get enough sleep. °· Avoid using too much pain medicine. °GET HELP RIGHT AWAY IF:  °· Your headache becomes really bad. °· You have a fever. °· You have a stiff neck. °· You have trouble seeing. °· Your muscles are weak, or you lose muscle control. °· You lose your balance or have trouble walking. °· You feel like you will pass out (faint), or you pass out. °· You have really bad symptoms that are different than your first symptoms. °· You have problems with the medicines given to you by your doctor. °· Your medicines do not work. °· Your headache feels different than the other headaches. °· You feel sick to your stomach (nauseous) or throw up (vomit). °MAKE SURE YOU:  °· Understand these instructions. °· Will watch your condition. °· Will get help right away if you are not doing well or get worse. °Document Released: 04/22/2009 Document Revised: 04/20/2011 Document Reviewed: 01/16/2011 °ExitCare® Patient Information ©2015 [employer]. This information is not intended to replace advice given to you by your health  care provider. Make sure you discuss any questions you have with your health care provider. ° °

## 2013-09-30 NOTE — MAU Note (Signed)
Pt states headache x5 days on right side only. Takes excedrin which usually helps however has not. No vaginal issues

## 2013-09-30 NOTE — MAU Note (Signed)
No adverse reaction to fioricet.

## 2013-12-11 ENCOUNTER — Encounter (HOSPITAL_COMMUNITY): Payer: Self-pay

## 2014-09-03 ENCOUNTER — Encounter (HOSPITAL_COMMUNITY): Payer: Self-pay

## 2014-09-03 ENCOUNTER — Emergency Department (HOSPITAL_COMMUNITY)
Admission: EM | Admit: 2014-09-03 | Discharge: 2014-09-03 | Disposition: A | Discharge status: Home / Self Care | Payer: Medicaid Other | Attending: Emergency Medicine | Admitting: Emergency Medicine

## 2014-09-03 DIAGNOSIS — R51 Headache: Secondary | ICD-10-CM | POA: Diagnosis present

## 2014-09-03 DIAGNOSIS — F410 Panic disorder [episodic paroxysmal anxiety] without agoraphobia: Secondary | ICD-10-CM | POA: Diagnosis not present

## 2014-09-03 NOTE — ED Provider Notes (Signed)
CSN: [id number]     Arrival date & time 09/03/14  1739 History   First MD Initiated Contact with Patient 09/03/14 1850     Chief Complaint  Patient presents with  . Headache  . Tingling  . Tremors     (Consider location/radiation/quality/duration/timing/severity/associated sxs/prior Treatment) HPI   Jessica Lutz is a 29 y.o. female who presents for evaluation of a constellation of symptoms including chest tightness, shortness of breath, palpitation, headache and feeling uncomfortable. She took aspirin with improvement of her symptoms. Symptoms started about 3 PM and have resolved as of 7 PM. She denies prior similar problem. She denies stress. She works as a Conservation officer, nature at [employer]. He denies recent fever, chills, nausea, vomiting, cough, back pain or vertigo symptoms. There are no other known modifying factors.   Past Medical History  Diagnosis Date  . No pertinent past medical history    Past Surgical History  Procedure Laterality Date  . No past surgeries     Family History  Problem Relation Age of Onset  . Alcohol abuse Neg Hx    History  Substance Use Topics  . Smoking status: Never Smoker   . Smokeless tobacco: Never Used  . Alcohol Use: Yes     Comment: occ   OB History    Gravida Para Term Preterm AB TAB SAB Ectopic Multiple Living   Review of Systems  All other systems reviewed and are negative.     Allergies  Review of patient's allergies indicates no known allergies.  Home Medications   Prior to Admission medications   Medication Sig Start Date End Date Taking? Authorizing Provider  aspirin-acetaminophen-caffeine (EXCEDRIN MIGRAINE) [phone] MG per tablet Take 3 tablets by mouth every 6 (six) hours as needed for headache.   Yes Historical Provider, MD  butalbital-acetaminophen-caffeine (FIORICET, ESGIC) 50-325-40 MG per tablet Take 2 tablets by mouth every 6 (six) hours as needed for headache. Patient not taking: Reported  on 09/03/2014 09/30/13   Duane Lope, NP   BP 116/66 mmHg  Pulse 71  Temp(Src) 98.2 F (36.8 C) (Oral)  Resp 18  Wt 170 lb (77.111 kg)  SpO2 100%  LMP 08/05/2014 Physical Exam  Constitutional: She is oriented to person, place, and time. She appears well-developed and well-nourished.  HENT:  Head: Normocephalic and atraumatic.  Right Ear: External ear normal.  Left Ear: External ear normal.  Eyes: Conjunctivae and EOM are normal. Pupils are equal, round, and reactive to light.  Neck: Normal range of motion and phonation normal. Neck supple.  Cardiovascular: Normal rate, regular rhythm and normal heart sounds.   Pulmonary/Chest: Effort normal and breath sounds normal. She exhibits no bony tenderness.  Abdominal: Soft. There is no tenderness.  Musculoskeletal: Normal range of motion.  Neurological: She is alert and oriented to person, place, and time. No cranial nerve deficit or sensory deficit. She exhibits normal muscle tone. Coordination normal.  No dysarthria and aphasia or nystagmus. Normal gait. No ataxia.  Skin: Skin is warm, dry and intact.  Psychiatric: She has a normal mood and affect. Her behavior is normal. Judgment and thought content normal.  Nursing note and vitals reviewed.   ED Course  Procedures (including critical care time)  Findings discussed with patient and family member, all questions were answered.  Labs Review Labs Reviewed - No data to display  Imaging Review No results found.   EKG Interpretation None  MDM   Final diagnoses:  Panic attack    Transient symptoms, which improved spontaneously. Symptom constellation is typical panic attack. I doubt migraine headache, metabolic instability, serious bacterial infection or impending vascular collapse.   Nursing Notes Reviewed/ Care Coordinated Applicable Imaging Reviewed Interpretation of Laboratory Data incorporated into ED treatment  The patient appears reasonably screened and/or  stabilized for discharge and I doubt any other medical condition or other [facility] requiring further screening, evaluation, or treatment in the ED at this time prior to discharge.  Plan: Home Medications- none; Home Treatments- rest; return here if the recommended treatment, does not improve the symptoms; Recommended follow up- PCP prn     Mancel Bale, MD 09/03/14 [phone]

## 2014-09-03 NOTE — Discharge Instructions (Signed)

## 2014-09-03 NOTE — ED Notes (Signed)
Pt c/o headache, hand tremors, chest tightness, and tingling in face and bilateral hands starting today.  Pain score 7/10.  Pt reports taking an Excedrin w/o relief.  Denies n/v/d.

## 2014-09-10 ENCOUNTER — Inpatient Hospital Stay (HOSPITAL_COMMUNITY)
Admission: AD | Admit: 2014-09-10 | Discharge: 2014-09-10 | Disposition: A | Discharge status: Home / Self Care | Payer: Medicaid Other | Source: Ambulatory Visit | Attending: Family Medicine | Admitting: Family Medicine

## 2014-09-10 DIAGNOSIS — Z3202 Encounter for pregnancy test, result negative: Secondary | ICD-10-CM | POA: Diagnosis not present

## 2014-09-10 DIAGNOSIS — Z3200 Encounter for pregnancy test, result unknown: Secondary | ICD-10-CM | POA: Diagnosis present

## 2014-09-10 LAB — POCT PREGNANCY, URINE: PREG TEST UR: NEGATIVE

## 2014-09-10 LAB — HCG, QUANTITATIVE, PREGNANCY

## 2014-09-10 NOTE — MAU Note (Signed)
Pt wants preg test, did 4 HPT's at home, thinks they were all positive, but some of the lines were faint.  Denies pain or bleeding.

## 2014-09-10 NOTE — MAU Note (Signed)
Pt called, not in lobby 

## 2014-09-10 NOTE — MAU Note (Signed)
Urine in lab 

## 2014-09-10 NOTE — MAU Provider Note (Signed)
S: pt in for preg test. O: urine pt neg, quant neg A: not pregnant P pt informed of results and d/c home

## 2015-03-20 ENCOUNTER — Emergency Department (HOSPITAL_COMMUNITY)
Admission: EM | Admit: 2015-03-20 | Discharge: 2015-03-20 | Disposition: A | Discharge status: Home / Self Care | Payer: Medicaid Other | Attending: Emergency Medicine | Admitting: Emergency Medicine

## 2015-03-20 ENCOUNTER — Encounter (HOSPITAL_COMMUNITY): Payer: Self-pay | Admitting: Emergency Medicine

## 2015-03-20 DIAGNOSIS — K029 Dental caries, unspecified: Secondary | ICD-10-CM | POA: Diagnosis not present

## 2015-03-20 DIAGNOSIS — Z7982 Long term (current) use of aspirin: Secondary | ICD-10-CM | POA: Diagnosis not present

## 2015-03-20 DIAGNOSIS — K0889 Other specified disorders of teeth and supporting structures: Secondary | ICD-10-CM | POA: Diagnosis present

## 2015-03-20 DIAGNOSIS — R51 Headache: Secondary | ICD-10-CM | POA: Diagnosis not present

## 2015-03-20 MED ORDER — PENICILLIN V POTASSIUM 250 MG PO TABS: 250.0000 mg | ORAL_TABLET | Freq: Four times a day (QID) | ORAL | Status: AC

## 2015-03-20 NOTE — ED Provider Notes (Signed)
CSN: [id number]     Arrival date & time 03/20/15  1019 History  By signing my name below, I, [name], attest that this documentation has been prepared under the direction and in the presence of Roxy Horseman, PA-C Electronically Signed: [name], ED Scribe. 03/20/2015. 10:45 AM.   Chief Complaint  Patient presents with  . Dental Pain      The history is provided by the patient. No language interpreter was used.    Jessica Lutz is a 30 y.o. female who presents to the Emergency Department complaining of 8/10 right upper dental pain onset 4 days. Pt reports that she does have a dentist at this time. She states that she is having associated symptoms of HA and right sided facial swelling. She states that she has tried ibuprofen with no relief for her symptoms. She denies fever, chills, n/v, and any other symptoms. Pt denies allergies to any medications.    Past Medical History  Diagnosis Date  . No pertinent past medical history    Past Surgical History  Procedure Laterality Date  . No past surgeries     Family History  Problem Relation Age of Onset  . Alcohol abuse Neg Hx    Social History  Substance Use Topics  . Smoking status: Never Smoker   . Smokeless tobacco: Never Used  . Alcohol Use: Yes     Comment: occ   OB History    Gravida Para Term Preterm AB TAB SAB Ectopic Multiple Living   Review of Systems  Constitutional: Negative for fever and chills.  HENT: Positive for dental problem and facial swelling.   Gastrointestinal: Negative for nausea and vomiting.  Neurological: Positive for headaches.      Allergies  Review of patient's allergies indicates no known allergies.  Home Medications   Prior to Admission medications   Medication Sig Start Date End Date Taking? Authorizing Provider  aspirin-acetaminophen-caffeine (EXCEDRIN MIGRAINE) [phone] MG per tablet Take 3 tablets by mouth every 6 (six) hours as needed for  headache.    Historical Provider, MD  butalbital-acetaminophen-caffeine (FIORICET, ESGIC) 50-325-40 MG per tablet Take 2 tablets by mouth every 6 (six) hours as needed for headache. Patient not taking: Reported on 09/03/2014 09/30/13   Duane Lope, NP   BP 104/68 mmHg  Pulse 83  Temp(Src) 98.4 F (36.9 C)  Resp 18  SpO2 100% Physical Exam  Physical Exam  Constitutional: Pt appears well-developed and well-nourished.  HENT:  Head: Normocephalic.  Right Ear: Tympanic membrane, external ear and ear canal normal.  Left Ear: Tympanic membrane, external ear and ear canal normal.  Nose: Nose normal. Right sinus exhibits no maxillary sinus tenderness and no frontal sinus tenderness. Left sinus exhibits no maxillary sinus tenderness and no frontal sinus tenderness.  Mouth/Throat: Uvula is midline, oropharynx is clear and moist and mucous membranes are normal. No oral lesions. Abnormal dentition. Dental caries present. No uvula swelling or lacerations. No oropharyngeal exudate, posterior oropharyngeal edema, posterior oropharyngeal erythema or tonsillar abscesses.  No gingival swelling, fluctuance or induration No gross abscess  Eyes: Conjunctivae are normal. Pupils are equal, round, and reactive to light. Right eye exhibits no discharge. Left eye exhibits no discharge.  Neck: Normal range of motion. Neck supple.  No stridor Handling secretions without difficulty No nuchal rigidity No cervical lymphadenopathy   Cardiovascular: Normal rate, regular rhythm and normal heart sounds.   Pulmonary/Chest: Effort  normal. No respiratory distress.  Equal chest rise  Abdominal: Soft. Bowel sounds are normal. Pt exhibits no distension. There is no tenderness.  Lymphadenopathy:    Pt has no cervical adenopathy.  Neurological: Pt is alert.  Skin: Skin is warm and dry.  Psychiatric: Pt has a normal mood and affect.  Nursing note and vitals reviewed.    ED Course  Procedures (including critical  care time) DIAGNOSTIC STUDIES: Oxygen Saturation is 100% on RA, nl by my interpretation.    COORDINATION OF CARE: 10:43 AM Discussed treatment plan with pt at bedside which includes penicillin Rx, referral and follow up with dentist and pt agreed to plan.      MDM   Final diagnoses:  Pain, dental    Patient with dentalgia.  No abscess requiring immediate incision and drainage.  Exam not concerning for Ludwig's angina or pharyngeal abscess.  Will treat with penicillin Rx. Pt instructed to follow-up with dentist.  Discussed return precautions. Pt safe for discharge.  I personally performed the services described in this documentation, which was scribed in my presence. The recorded information has been reviewed and is accurate.      Roxy Horseman, PA-C 03/20/15 1047  Tilden Fossa, MD 03/21/15 [phone]

## 2015-03-20 NOTE — ED Notes (Signed)
R upper dental pain since Sunday.   Patient states took ibuprofen at home for pain.   Patient states does have dentist, but has not seen one.

## 2015-03-20 NOTE — Discharge Instructions (Signed)

## 2016-02-16 ENCOUNTER — Encounter (HOSPITAL_COMMUNITY): Payer: Self-pay

## 2016-02-16 ENCOUNTER — Inpatient Hospital Stay (HOSPITAL_COMMUNITY)
Admission: AD | Admit: 2016-02-16 | Discharge: 2016-02-16 | Disposition: A | Discharge status: Home / Self Care | Payer: Medicaid Other | Source: Ambulatory Visit | Attending: Obstetrics & Gynecology | Admitting: Obstetrics & Gynecology

## 2016-02-16 DIAGNOSIS — N938 Other specified abnormal uterine and vaginal bleeding: Secondary | ICD-10-CM | POA: Insufficient documentation

## 2016-02-16 LAB — URINALYSIS, ROUTINE W REFLEX MICROSCOPIC
BACTERIA UA: NONE SEEN
Bilirubin Urine: NEGATIVE
Glucose, UA: NEGATIVE mg/dL
Ketones, ur: NEGATIVE mg/dL
Leukocytes, UA: NEGATIVE
Nitrite: NEGATIVE
PROTEIN: 30 mg/dL — AB
Specific Gravity, Urine: 1.027 (ref 1.005–1.030)
Squamous Epithelial / LPF: NONE SEEN
pH: 5 (ref 5.0–8.0)

## 2016-02-16 LAB — WET PREP, GENITAL
Sperm: NONE SEEN
TRICH WET PREP: NONE SEEN
Yeast Wet Prep HPF POC: NONE SEEN

## 2016-02-16 LAB — CBC
HCT: 35.7 % — ABNORMAL LOW (ref 36.0–46.0)
Hemoglobin: 12 g/dL (ref 12.0–15.0)
MCH: 27.8 pg (ref 26.0–34.0)
MCHC: 33.6 g/dL (ref 30.0–36.0)
MCV: 82.6 fL (ref 78.0–100.0)
PLATELETS: 257 10*3/uL (ref 150–400)
RBC: 4.32 MIL/uL (ref 3.87–5.11)
RDW: 13.2 % (ref 11.5–15.5)
WBC: 6.3 10*3/uL (ref 4.0–10.5)

## 2016-02-16 LAB — TSH: TSH: 0.67 u[IU]/mL (ref 0.350–4.500)

## 2016-02-16 LAB — RAPID HIV SCREEN (HIV 1/2 AB+AG)
HIV 1/2 Antibodies: NONREACTIVE
HIV-1 P24 Antigen - HIV24: NONREACTIVE

## 2016-02-16 LAB — POCT PREGNANCY, URINE: PREG TEST UR: NEGATIVE

## 2016-02-16 MED ORDER — MEDROXYPROGESTERONE ACETATE 10 MG PO TABS: 10.0000 mg | ORAL_TABLET | Freq: Every day | ORAL | 2 refills | Status: DC

## 2016-02-16 NOTE — Discharge Instructions (Signed)
Abnormal Uterine Bleeding Abnormal uterine bleeding means bleeding from the vagina that is not your normal menstrual period. This can be:  Bleeding or spotting between periods.  Bleeding after sex (sexual intercourse).  Bleeding that is heavier or more than normal.  Periods that last longer than usual.  Bleeding after menopause. There are many problems that may cause this. Treatment will depend on the cause of the bleeding. Any kind of bleeding that is not normal should be reviewed by your doctor. Follow these instructions at home: Watch your condition for any changes. These actions may lessen any discomfort you are having:  Do not use tampons or douches as told by your doctor.  Change your pads often. You should get regular pelvic exams and Pap tests. Keep all appointments for tests as told by your doctor. Contact a doctor if:  You are bleeding for more than 1 week.  You feel dizzy at times. Get help right away if:  You pass out.  You have to change pads every 15 to 30 minutes.  You have belly pain.  You have a fever.  You become sweaty or weak.  You are passing large blood clots from the vagina.  You feel sick to your stomach (nauseous) and throw up (vomit). This information is not intended to replace advice given to you by your health care provider. Make sure you discuss any questions you have with your health care provider. Document Released: 11/23/2008 Document Revised: 07/04/2015 Document Reviewed: 08/25/2012 Elsevier Interactive Patient Education  2017 [employer]  

## 2016-02-16 NOTE — MAU Provider Note (Signed)
Chief Complaint:  Vaginal Bleeding   First Provider Initiated Contact with Patient 02/16/16 1850    HPI: Jessica Lutz is a 31 y.o. [id number] who presents to maternity admissions reporting vaginal bleeding since early November. Bleeding has stopped and started, but has been pretty consistent. States is heavy with clots. Not much cramping at all.  Has had some irregular cycles in past.Not on contraception. 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 . She reports vaginal bleeding, but no vaginal itching/burning, urinary symptoms, h/a, dizziness, n/v, or fever/chills.    Vaginal Bleeding  The patient's primary symptoms include vaginal bleeding. The patient's pertinent negatives include no genital itching, genital lesions, genital odor, pelvic pain or vaginal discharge. This is a recurrent problem. The current episode started more than 1 month ago. The problem occurs intermittently. The problem has been unchanged. The patient is experiencing no pain. She is not pregnant. Pertinent negatives include no abdominal pain, back pain, constipation, diarrhea, fever, nausea or vomiting. The vaginal discharge was bloody. The vaginal bleeding is heavier than menses. She has been passing clots. She has not been passing tissue. Nothing aggravates the symptoms. She has tried nothing for the symptoms.    RN Note: Pt c/o bleeding non-stop since November 3rd. Pt states the bleeding has been slowly getting worse. Pt states recently the bleeding has been heavy with some clots. Pt states she is not on birth control currently.  Past Medical History: Past Medical History:  Diagnosis Date  . No pertinent past medical history     Past obstetric history: OB History  Gravida Para Term Preterm AB Living  3 1 1   2 1   SAB TAB Ectopic Multiple Live Births  2       1    # Outcome Date GA Lbr Len/2nd Weight Sex Delivery Anes PTL Lv  3 SAB 03/30/13    U         Birth Comments: System Generated.  Please review and update pregnancy details.  2 SAB 01/09/11    U      1 Term 04/13/10    F Vag-Vacuum   LIV     Birth Comments: System Generated. Please review and update pregnancy details.      Past Surgical History: Past Surgical History:  Procedure Laterality Date  . NO PAST SURGERIES      Family History: Family History  Problem Relation Age of Onset  . Alcohol abuse Neg Hx     Social History: Social History  Substance Use Topics  . Smoking status: Never Smoker  . Smokeless tobacco: Never Used  . Alcohol use Yes     Comment: occ    Allergies: No Known Allergies  Meds:  Prescriptions Prior to Admission  Medication Sig Dispense Refill Last Dose  . aspirin-acetaminophen-caffeine (EXCEDRIN MIGRAINE) 250-250-65 MG per tablet Take 3 tablets by mouth every 6 (six) hours as needed for headache.   09/03/2014 at Unknown time  . butalbital-acetaminophen-caffeine (FIORICET, ESGIC) 50-325-40 MG per tablet Take 2 tablets by mouth every 6 (six) hours as needed for headache. (Patient not taking: Reported on 09/03/2014) 14 tablet 0     I have reviewed patient's Past Medical Hx, Surgical Hx, Family Hx, Social Hx, medications and allergies.  ROS:  Review of Systems  Constitutional: Negative for fever.  Gastrointestinal: Negative for abdominal pain, constipation, diarrhea, nausea and vomiting.  Genitourinary: Positive for vaginal bleeding. Negative for pelvic pain and vaginal discharge.  Musculoskeletal: Negative for back  pain.   Other systems negative     Physical Exam  Patient Vitals for the past 24 hrs:  BP Temp Temp src Pulse Resp SpO2 Height Weight  02/16/16 1831 102/83 98.3 F (36.8 C) Oral 92 17 100 % 5\' 6"  (1.676 m) 192 lb 4 oz (87.2 kg)   Constitutional: Well-developed, well-nourished female in no acute distress.  Cardiovascular: normal rate and rhythm, no ectopy audible, S1 & S2 heard, no murmur Respiratory: normal effort, no distress. Lungs CTAB with no wheezes or  crackles GI: Abd soft, non-tender.  Nondistended.  No rebound, No guarding.  Bowel Sounds audible  MS: Extremities nontender, no edema, normal ROM Neurologic: Alert and oriented x 4.   Grossly nonfocal. GU: Neg CVAT. Skin:  Warm and Dry Psych:  Affect appropriate.  PELVIC EXAM: Cervix pink, visually closed, without lesion, small to moderate bloody discharge, vaginal walls and external genitalia normal Bimanual exam: Cervix firm, external os slightly open, closed internal os, anterior, neg CMT, uterus nontender, nonenlarged, adnexa without tenderness, enlargement, or mass    Labs:    Results for orders placed or performed during the hospital encounter of 02/16/16 (from the past 72 hour(s))  Urinalysis, Routine w reflex microscopic     Status: Abnormal   Collection Time: 02/16/16  6:39 PM  Result Value Ref Range   Color, Urine YELLOW YELLOW   APPearance HAZY (A) CLEAR   Specific Gravity, Urine 1.027 1.005 - 1.030   pH 5.0 5.0 - 8.0   Glucose, UA NEGATIVE NEGATIVE mg/dL   Hgb urine dipstick MODERATE (A) NEGATIVE   Bilirubin Urine NEGATIVE NEGATIVE   Ketones, ur NEGATIVE NEGATIVE mg/dL   Protein, ur 30 (A) NEGATIVE mg/dL   Nitrite NEGATIVE NEGATIVE   Leukocytes, UA NEGATIVE NEGATIVE   RBC / HPF TOO NUMEROUS TO COUNT 0 - 5 RBC/hpf   WBC, UA 0-5 0 - 5 WBC/hpf   Bacteria, UA NONE SEEN NONE SEEN   Squamous Epithelial / LPF NONE SEEN NONE SEEN   Mucous PRESENT   Pregnancy, urine POC     Status: None   Collection Time: 02/16/16  6:41 PM  Result Value Ref Range   Preg Test, Ur NEGATIVE NEGATIVE    Comment:        THE SENSITIVITY OF THIS METHODOLOGY IS >24 mIU/mL   Wet prep, genital     Status: Abnormal   Collection Time: 02/16/16  7:00 PM  Result Value Ref Range   Yeast Wet Prep HPF POC NONE SEEN NONE SEEN   Trich, Wet Prep NONE SEEN NONE SEEN   Clue Cells Wet Prep HPF POC PRESENT (A) NONE SEEN   WBC, Wet Prep HPF POC FEW (A) NONE SEEN    Comment: MODERATE BACTERIA SEEN   Sperm  NONE SEEN   CBC     Status: Abnormal   Collection Time: 02/16/16  7:09 PM  Result Value Ref Range   WBC 6.3 4.0 - 10.5 K/uL   RBC 4.32 3.87 - 5.11 MIL/uL   Hemoglobin 12.0 12.0 - 15.0 g/dL   HCT 16.135.7 (L) 09.636.0 - 04.546.0 %   MCV 82.6 78.0 - 100.0 fL   MCH 27.8 26.0 - 34.0 pg   MCHC 33.6 30.0 - 36.0 g/dL   RDW 40.913.2 81.111.5 - 91.415.5 %   Platelets 257 150 - 400 K/uL  TSH     Status: None   Collection Time: 02/16/16  7:09 PM  Result Value Ref Range   TSH 0.670 0.350 - 4.500  uIU/mL    Comment: Performed by a 3rd Generation assay with a functional sensitivity of <=0.01 uIU/mL.  Rapid HIV screen (HIV 1/2 Ab+Ag)     Status: None   Collection Time: 02/16/16  7:09 PM  Result Value Ref Range   HIV-1 P24 Antigen - HIV24 NON REACTIVE NON REACTIVE   HIV 1/2 Antibodies NON REACTIVE NON REACTIVE   Interpretation (HIV Ag Ab)      A non reactive test result means that HIV 1 or HIV 2 antibodies and HIV 1 p24 antigen were not detected in the specimen.  RPR     Status: None   Collection Time: 02/16/16  7:09 PM  Result Value Ref Range   RPR Ser Ql Non Reactive Non Reactive    Comment: (NOTE) Performed At: [facility] [address] [place], [place] [id number] Mila Homer MD YQ:[id number]      Imaging:  No results found.  MAU Course/MDM: I have ordered labs as follows:CBC and TSH. Patient requested STD testing Imaging ordered: none, but may need pelvic US if this does not resolve Results reviewed.   Discussed not anemic, does not reflect large blood loss.  Discussed probably anovulatory bleeding.  Discussed option of Provera challenge or OCPs. Wants to try Provera challenge first.   Pt stable at time of discharge.  Assessment: Dysfunctional Uterine Bleeding, possible anovulatory  No anemia  Plan: Discharge home Recommend Provera challenge, Warned of withdrawal bleed Rx sent for Provera  for menorrhagia May need [place] if this does not resolve with Provera.  Encouraged to return  here or to other Urgent Care/ED if she develops worsening of symptoms, increase in pain, fever, or other concerning symptoms.   Wynelle Bourgeois [name], MSN Certified Nurse-Midwife 02/16/2016 6:50 PM

## 2016-02-16 NOTE — MAU Note (Signed)
Pt c/o bleeding non-stop since November 3rd. Pt states the bleeding has been slowly getting worse. Pt states recently the bleeding has been heavy with some clots. Pt states she is not on birth control currently.

## 2016-02-17 LAB — RPR: RPR Ser Ql: NONREACTIVE

## 2016-02-24 LAB — GC/CHLAMYDIA PROBE AMP (~~LOC~~) NOT AT ARMC
Chlamydia: NEGATIVE
Neisseria Gonorrhea: NEGATIVE

## 2016-03-19 ENCOUNTER — Other Ambulatory Visit: Payer: Self-pay | Admitting: Family Medicine

## 2016-03-19 DIAGNOSIS — N921 Excessive and frequent menstruation with irregular cycle: Secondary | ICD-10-CM

## 2016-03-19 DIAGNOSIS — R102 Pelvic and perineal pain: Secondary | ICD-10-CM

## 2016-03-26 ENCOUNTER — Other Ambulatory Visit: Payer: Self-pay

## 2016-03-29 ENCOUNTER — Encounter (HOSPITAL_COMMUNITY): Payer: Self-pay

## 2016-03-29 ENCOUNTER — Emergency Department (HOSPITAL_COMMUNITY)
Admission: EM | Admit: 2016-03-29 | Discharge: 2016-03-29 | Disposition: A | Discharge status: Home / Self Care | Payer: Self-pay | Attending: Emergency Medicine | Admitting: Emergency Medicine

## 2016-03-29 DIAGNOSIS — Z7982 Long term (current) use of aspirin: Secondary | ICD-10-CM | POA: Insufficient documentation

## 2016-03-29 DIAGNOSIS — Z79899 Other long term (current) drug therapy: Secondary | ICD-10-CM | POA: Insufficient documentation

## 2016-03-29 DIAGNOSIS — K047 Periapical abscess without sinus: Secondary | ICD-10-CM | POA: Insufficient documentation

## 2016-03-29 MED ORDER — IBUPROFEN 400 MG PO TABS: 400.0000 mg | ORAL_TABLET | Freq: Three times a day (TID) | ORAL | 0 refills | Status: DC | PRN

## 2016-03-29 MED ORDER — PENICILLIN V POTASSIUM 500 MG PO TABS: 500.0000 mg | ORAL_TABLET | Freq: Three times a day (TID) | ORAL | 0 refills | Status: DC

## 2016-03-29 MED ORDER — KETOROLAC TROMETHAMINE 60 MG/2ML IM SOLN
60.0000 mg | Freq: Once | INTRAMUSCULAR | Status: AC
  Administered 2016-03-29: 60 mg via INTRAMUSCULAR
  Filled 2016-03-29: qty 2

## 2016-03-29 MED ORDER — FLUCONAZOLE 200 MG PO TABS: 200.0000 mg | ORAL_TABLET | Freq: Every day | ORAL | 0 refills | Status: DC

## 2016-03-29 MED ORDER — HYDROCODONE-ACETAMINOPHEN 5-325 MG PO TABS: 1.0000 | ORAL_TABLET | ORAL | 0 refills | Status: DC | PRN

## 2016-03-29 MED ORDER — PENICILLIN V POTASSIUM 500 MG PO TABS
500.0000 mg | ORAL_TABLET | Freq: Once | ORAL | Status: AC
  Administered 2016-03-29: 500 mg via ORAL
  Filled 2016-03-29: qty 1

## 2016-03-29 NOTE — ED Provider Notes (Signed)
[facility]-EMERGENCY DEPT Provider Note   CSN: [id number] Arrival date & time: 03/29/16  0827     History   Chief Complaint Chief Complaint  Patient presents with  . Dental Pain    HPI Jessica Lutz is a 31 y.o. female.  HPI Patient presents to the emergency department with complaints of right sided dental pain and now increasing right-sided facial swelling over the past 48 hours.  She is scheduled to see her dentist in the morning.  She took ibuprofen for pain last night.  She continues to complain of right-sided facial pain and right-sided headache at this time.  No difficulty breathing or swallowing.  Pain is moderate in severity.  Pain is worse with palpation of her right upper molar.  No other complaints.   Past Medical History:  Diagnosis Date  . No pertinent past medical history     There are no active problems to display for this patient.   Past Surgical History:  Procedure Laterality Date  . NO PAST SURGERIES      OB History    Gravida Para Term Preterm AB Living   3 1 1   2 1    SAB TAB Ectopic Multiple Live Births   2       1       Home Medications    Prior to Admission medications   Medication Sig Start Date End Date Taking? Authorizing Provider  aspirin-acetaminophen-caffeine (EXCEDRIN MIGRAINE) [phone] MG per tablet Take 3 tablets by mouth every 6 (six) hours as needed for headache.    Historical Provider, MD  butalbital-acetaminophen-caffeine (FIORICET, ESGIC) 50-325-40 MG per tablet Take 2 tablets by mouth every 6 (six) hours as needed for headache. Patient not taking: Reported on 09/03/2014 09/30/13   Duane Lope, NP  HYDROcodone-acetaminophen (NORCO/VICODIN) 5-325 MG tablet Take 1 tablet by mouth every 4 (four) hours as needed for moderate pain. 03/29/16   Azalia Bilis, MD  ibuprofen (ADVIL,MOTRIN) 400 MG tablet Take 1 tablet (400 mg total) by mouth every 8 (eight) hours as needed. 03/29/16   Azalia Bilis, MD  medroxyPROGESTERone (PROVERA) 10 MG  tablet Take 1 tablet (10 mg total) by mouth daily. Use for ten days 02/16/16   Aviva Signs, [name]  penicillin v potassium (VEETID) 500 MG tablet Take 1 tablet (500 mg total) by mouth 3 (three) times daily. 03/29/16   Azalia Bilis, MD    Family History Family History  Problem Relation Age of Onset  . Thyroid disease Mother   . Alcohol abuse Neg Hx     Social History Social History  Substance Use Topics  . Smoking status: Never Smoker  . Smokeless tobacco: Never Used  . Alcohol use Yes     Comment: occ     Allergies   Patient has no known allergies.   Review of Systems Review of Systems  All other systems reviewed and are negative.    Physical Exam Updated Vital Signs BP 116/81 (BP Location: Right Arm)   Pulse 93   Temp 98.2 F (36.8 C) (Oral)   Resp 14   Ht 5\' 5"  (1.651 m)   Wt 185 lb (83.9 kg)   LMP 03/31/2015   SpO2 99%   BMI 30.79 kg/m   Physical Exam  Constitutional: She is oriented to person, place, and time. She appears well-developed and well-nourished.  HENT:  Head: Normocephalic.  Right-sided facial swelling.  Obvious dental decay and tenderness of her right upper first molar.  No obvious gingival  swelling or fluctuance  Eyes: EOM are normal.  Neck: Normal range of motion.  Pulmonary/Chest: Effort normal.  Abdominal: She exhibits no distension.  Musculoskeletal: Normal range of motion.  Neurological: She is alert and oriented to person, place, and time.  Psychiatric: She has a normal mood and affect.  Nursing note and vitals reviewed.    ED Treatments / Results  Labs (all labs ordered are listed, but only abnormal results are displayed) Labs Reviewed - No data to display  EKG  EKG Interpretation None       Radiology No results found.  Procedures Procedures (including critical care time)  Medications Ordered in ED Medications  ketorolac (TORADOL) injection 60 mg (not administered)  penicillin v potassium (VEETID) tablet 500 mg  (not administered)     Initial Impression / Assessment and Plan / ED Course  I have reviewed the triage vital signs and the nursing notes.  Pertinent labs & imaging results that were available during my care of the patient were reviewed by me and considered in my medical decision making (see chart for details).     dental infection.  Home with antibiotics aation.  She is ready scheduled to see her dentist for follow-up tomorrow.  She has right-sided facial swelling but no involvement of her oral airway.   Final Clinical Impressions(s) / ED Diagnoses   Final diagnoses:  Dental infection    New Prescriptions New Prescriptions   HYDROCODONE-ACETAMINOPHEN (NORCO/VICODIN) 5-325 MG TABLET    Take 1 tablet by mouth every 4 (four) hours as needed for moderate pain.   IBUPROFEN (ADVIL,MOTRIN) 400 MG TABLET    Take 1 tablet (400 mg total) by mouth every 8 (eight) hours as needed.   PENICILLIN V POTASSIUM (VEETID) 500 MG TABLET    Take 1 tablet (500 mg total) by mouth 3 (three) times daily.     Azalia BilisKevin Madai Nuccio, MD 03/29/16 [phone]

## 2016-03-29 NOTE — ED Triage Notes (Signed)
Patient c/o right upper and left lower dental pain. Patient has left facial swelling. Patient not having any difficulty swallowing.or breathing.

## 2016-03-29 NOTE — Discharge Instructions (Signed)
Please see your dentist for follow up tomorrow as scheduled

## 2016-08-19 ENCOUNTER — Emergency Department (HOSPITAL_COMMUNITY)
Admission: EM | Admit: 2016-08-19 | Discharge: 2016-08-19 | Disposition: A | Discharge status: Home / Self Care | Payer: Medicaid Other | Attending: Emergency Medicine | Admitting: Emergency Medicine

## 2016-08-19 ENCOUNTER — Encounter (HOSPITAL_COMMUNITY): Payer: Self-pay | Admitting: Emergency Medicine

## 2016-08-19 DIAGNOSIS — K0889 Other specified disorders of teeth and supporting structures: Secondary | ICD-10-CM | POA: Diagnosis not present

## 2016-08-19 MED ORDER — IBUPROFEN 800 MG PO TABS: 800.0000 mg | ORAL_TABLET | Freq: Once | ORAL | Status: DC

## 2016-08-19 MED ORDER — IBUPROFEN 800 MG PO TABS
800.0000 mg | ORAL_TABLET | Freq: Once | ORAL | Status: AC
  Administered 2016-08-19: 800 mg via ORAL
  Filled 2016-08-19: qty 1

## 2016-08-19 MED ORDER — IBUPROFEN 800 MG PO TABS: 800.0000 mg | ORAL_TABLET | Freq: Three times a day (TID) | ORAL | 0 refills | Status: DC

## 2016-08-19 MED ORDER — MAGIC MOUTHWASH: 5.0000 mL | Freq: Three times a day (TID) | ORAL | 0 refills | Status: DC | PRN

## 2016-08-19 MED ORDER — AMOXICILLIN-POT CLAVULANATE 875-125 MG PO TABS: 1.0000 | ORAL_TABLET | Freq: Two times a day (BID) | ORAL | 0 refills | Status: AC

## 2016-08-19 MED ORDER — FLUCONAZOLE 200 MG PO TABS: 200.0000 mg | ORAL_TABLET | Freq: Every day | ORAL | 0 refills | Status: DC

## 2016-08-19 NOTE — Discharge Instructions (Signed)
As discussed, take your entire course of antibiotics even if you feel better. Ibuprofen for pain and swelling.  Follow up with your dentist today. Apply heat to the area.   Return if swelling worsens, difficulty breathing, swallowing or other concerning symptoms in the meantime.

## 2016-08-19 NOTE — ED Provider Notes (Signed)
[facility]-EMERGENCY DEPT Provider Note   CSN: [id number] Arrival date & time: 08/19/16  1148  By signing my name below, I, Jessica Lutz, attest that this documentation has been prepared under the direction and in the presence of Jessica RobinsonsJessica Nikoli Nasser, PA-C. Electronically Signed: Deland PrettySherilynn Lutz, ED Scribe. 08/19/16. 1:05 PM.  History   Chief Complaint Chief Complaint  Patient presents with  . Dental Pain   The history is provided by the patient. No language interpreter was used.   HPI Comments: Jessica Lutz is an otherwise healthy 31 y.o. female who presents to the Emergency Department complaining of left lower molar pain that began 3 days ago. The pt denies recent dental surgeries and antibiotic usage. The pt reports that she has a dentist but is unable to be seen by them at this time due to her insurance. She states that she has tried hydrogen peroxide and warm salt water rinses with inadequate relief. She has also tried  [name] once yesterday with minimal relief. NKDA. The pt denies fever, chills, trouble swelling, nausea, and vomiting.    Past Medical History:  Diagnosis Date  . No pertinent past medical history     There are no active problems to display for this patient.   Past Surgical History:  Procedure Laterality Date  . NO PAST SURGERIES      OB History    Gravida Para Term Preterm AB Living   3 1 1   2 1    SAB TAB Ectopic Multiple Live Births   2       1       Home Medications    Prior to Admission medications   Medication Sig Start Date End Date Taking? Authorizing Provider  amoxicillin-clavulanate (AUGMENTIN) 875-125 MG tablet Take 1 tablet by mouth 2 (two) times daily. 08/19/16 08/26/16  Georgiana ShoreMitchell, Jocie Meroney B, PA-C  aspirin-acetaminophen-caffeine (EXCEDRIN MIGRAINE) [phone] MG per tablet Take 3 tablets by mouth every 6 (six) hours as needed for headache.    [provider]  butalbital-acetaminophen-caffeine (FIORICET, ESGIC) 50-325-40 MG per  tablet Take 2 tablets by mouth every 6 (six) hours as needed for headache. Patient not taking: Reported on 09/03/2014 09/30/13   [name], Victorino DikeJennifer I, NP  fluconazole (DIFLUCAN) 200 MG tablet Take 1 tablet (200 mg total) by mouth daily. 08/19/16   Georgiana ShoreMitchell, [name], PA-C  HYDROcodone-acetaminophen (NORCO/VICODIN) 5-325 MG tablet Take 1 tablet by mouth every 4 (four) hours as needed for moderate pain. 03/29/16   [name], [name], MD  ibuprofen (ADVIL,MOTRIN) 800 MG tablet Take 1 tablet (800 mg total) by mouth 3 (three) times daily. 08/19/16   Jessica RobinsonsMitchell, [name], PA-C  magic mouthwash SOLN Take 5 mLs by mouth 3 (three) times daily as needed for mouth pain. 08/19/16   Georgiana ShoreMitchell, [name], PA-C  medroxyPROGESTERone (PROVERA) 10 MG tablet Take 1 tablet (10 mg total) by mouth daily. Use for ten days 02/16/16   Aviva SignsWilliams, [name], [name]  penicillin v potassium (VEETID) 500 MG tablet Take 1 tablet (500 mg total) by mouth 3 (three) times daily. 03/29/16   [name], [name], MD    Family History Family History  Problem Relation Age of Onset  . Thyroid disease Mother   . Alcohol abuse Neg Hx     Social History Social History  Substance Use Topics  . Smoking status: Never Smoker  . Smokeless tobacco: Never Used  . Alcohol use Yes     Comment: occ     Allergies   Patient has no  known allergies.   Review of Systems Review of Systems  Constitutional: Negative for chills and fever.  HENT: Positive for dental problem and facial swelling. Negative for drooling, sore throat, trouble swallowing and voice change.   Respiratory: Negative for cough, choking, chest tightness, shortness of breath and stridor.   Cardiovascular: Negative for chest pain.  Gastrointestinal: Negative for nausea and vomiting.  Musculoskeletal: Negative for myalgias, neck pain and neck stiffness.  Skin: Negative for color change, pallor and rash.  Neurological: Negative for dizziness, facial asymmetry, light-headedness and headaches.      Physical Exam Updated Vital Signs BP 118/90 (BP Location: Left Arm)   Pulse 77   Temp 99 F (37.2 C) (Oral)   Resp 18   Ht 5\' 6"  (1.676 m)   Wt 83.9 kg (185 lb)   LMP 07/17/2016   SpO2 100%   BMI 29.86 kg/m   Physical Exam  Constitutional: She is oriented to person, place, and time. She appears well-developed and well-nourished. No distress.  Patient is afebrile, non-toxic appearing, seating comfortably in chair in no acute distress.  HENT:  Head: Normocephalic.  Mouth/Throat: Oropharynx is clear and moist. Dental abscesses present. No oropharyngeal exudate.  She has some erythema and edema around the left lower last molar, as depicted. Area not amenable to incision and drainage.  No gross oral abscess. Uvula is midline, arches are simmetrical and intact. No peritonsilar swelling or exudate. No trismus. Sublingual mucosa is soft and non-tender. Tolerating oral secretions. No concern for ludwig's angina.  Eyes: EOM are normal.  Neck: Normal range of motion.  Cardiovascular: Normal rate, regular rhythm, normal heart sounds and intact distal pulses.  Exam reveals no gallop and no friction rub.   No murmur heard. Pulmonary/Chest: Effort normal and breath sounds normal. No respiratory distress. She has no wheezes. She has no rales.  Abdominal: She exhibits no distension.  Musculoskeletal: Normal range of motion.  Lymphadenopathy:    She has no cervical adenopathy.  Neurological: She is alert and oriented to person, place, and time.  Skin: Skin is warm and dry. No rash noted. She is not diaphoretic. No erythema. No pallor.  Psychiatric: She has a normal mood and affect.  Nursing note and vitals reviewed.    ED Treatments / Results   DIAGNOSTIC STUDIES: Oxygen Saturation is 100% on RA, normal by my interpretation.   COORDINATION OF CARE: 12:06 PM-Discussed next steps with pt. Pt verbalized understanding and is agreeable with the plan.   Labs (all labs ordered are  listed, but only abnormal results are displayed) Labs Reviewed - No data to display  EKG  EKG Interpretation None       Radiology No results found.  Procedures Procedures (including critical care time)  Medications Ordered in ED Medications  ibuprofen (ADVIL,MOTRIN) tablet 800 mg (800 mg Oral Given 08/19/16 1247)     Initial Impression / Assessment and Plan / ED Course  I have reviewed the triage vital signs and the nursing notes.  Pertinent labs & imaging results that were available during my care of the patient were reviewed by me and considered in my medical decision making (see chart for details).     Here for evaluation of dental pain. Chronically poor dentition. On exam, there is no evidence of a drainable abscess. No trismus, glossal elevation, unilateral tonsillar swelling. No evidence of retropharyngeal or peritonsillar abscess or Ludwig angina.  Discharged with outpatient dental resources as well as referral to on-call dentistry. Also given prescription for  anti-inflammatories and antibiotics. Overall, appears well, nontoxic and appropriate for discharge.  Discussed strict return precautions and advised to return to the emergency department if experiencing any new or worsening symptoms. Instructions were understood and patient agreed with discharge plan. Final Clinical Impressions(s) / ED Diagnoses   Final diagnoses:  Pain, dental    New Prescriptions New Prescriptions   AMOXICILLIN-CLAVULANATE (AUGMENTIN) 875-125 MG TABLET    Take 1 tablet by mouth 2 (two) times daily.   IBUPROFEN (ADVIL,MOTRIN) 800 MG TABLET    Take 1 tablet (800 mg total) by mouth 3 (three) times daily.   MAGIC MOUTHWASH SOLN    Take 5 mLs by mouth 3 (three) times daily as needed for mouth pain.   I personally performed the services described in this documentation, which was scribed in my presence. The recorded information has been reviewed and is accurate.    Georgiana Shore,  PA-C 08/19/16 1316    Rolland Porter, MD 08/26/16 [phone]

## 2016-08-19 NOTE — ED Triage Notes (Signed)
Patient complaining of left lower dental pain. Patient states it started todays ago. Patient states that is swollen.

## 2017-05-26 ENCOUNTER — Emergency Department (HOSPITAL_COMMUNITY)
Admission: EM | Admit: 2017-05-26 | Discharge: 2017-05-26 | Disposition: A | Discharge status: Home / Self Care | Payer: Medicaid Other | Attending: Emergency Medicine | Admitting: Emergency Medicine

## 2017-05-26 ENCOUNTER — Other Ambulatory Visit: Payer: Self-pay

## 2017-05-26 DIAGNOSIS — Z79899 Other long term (current) drug therapy: Secondary | ICD-10-CM | POA: Diagnosis not present

## 2017-05-26 DIAGNOSIS — K047 Periapical abscess without sinus: Secondary | ICD-10-CM | POA: Diagnosis not present

## 2017-05-26 DIAGNOSIS — K0889 Other specified disorders of teeth and supporting structures: Secondary | ICD-10-CM | POA: Diagnosis present

## 2017-05-26 MED ORDER — OXYCODONE-ACETAMINOPHEN 5-325 MG PO TABS
1.0000 | ORAL_TABLET | Freq: Once | ORAL | Status: AC
  Administered 2017-05-26: 1 via ORAL
  Filled 2017-05-26: qty 1

## 2017-05-26 MED ORDER — CLINDAMYCIN HCL 300 MG PO CAPS: 300.0000 mg | ORAL_CAPSULE | Freq: Three times a day (TID) | ORAL | 0 refills | Status: DC

## 2017-05-26 MED ORDER — HYDROCODONE-ACETAMINOPHEN 5-325 MG PO TABS: 1.0000 | ORAL_TABLET | Freq: Four times a day (QID) | ORAL | 0 refills | Status: DC | PRN

## 2017-05-26 MED ORDER — NAPROXEN 375 MG PO TABS: 375.0000 mg | ORAL_TABLET | Freq: Two times a day (BID) | ORAL | 0 refills | Status: DC

## 2017-05-26 MED ORDER — CLINDAMYCIN HCL 300 MG PO CAPS
300.0000 mg | ORAL_CAPSULE | Freq: Once | ORAL | Status: AC
  Administered 2017-05-26: 300 mg via ORAL
  Filled 2017-05-26: qty 1

## 2017-05-26 NOTE — ED Provider Notes (Signed)
[facility]-EMERGENCY DEPT Provider Note   CSN: [id number] Arrival date & time: 05/26/17  1856     History   Chief Complaint Chief Complaint  Patient presents with  . Dental Injury    HPI Jessica Lutz is a 32 y.o. female who presents to the ED with dental pain. Patient reports having a cracked tooth for a long time and has had problems off and on with it. The pain and swelling is located in the left lower dental area. Patient has appointment with a dentist  but can not get in until May. Patient here due severe pain and facial swelling.   HPI  Past Medical History:  Diagnosis Date  . No pertinent past medical history     There are no active problems to display for this patient.   Past Surgical History:  Procedure Laterality Date  . NO PAST SURGERIES       OB History    Gravida  3   Para  1   Term  1   Preterm      AB  2   Living  1     SAB  2   TAB      Ectopic      Multiple      Live Births  1            Home Medications    Prior to Admission medications   Medication Sig Start Date End Date Taking? Authorizing Provider  clindamycin (CLEOCIN) 300 MG capsule Take 1 capsule (300 mg total) by mouth 3 (three) times daily. 05/26/17   Janne Napoleon, NP  fluconazole (DIFLUCAN) 200 MG tablet Take 1 tablet (200 mg total) by mouth daily. 08/19/16   Georgiana Shore, PA-C  HYDROcodone-acetaminophen (NORCO/VICODIN) 5-325 MG tablet Take 1 tablet by mouth every 6 (six) hours as needed. 05/26/17   Janne Napoleon, NP  medroxyPROGESTERone (PROVERA) 10 MG tablet Take 1 tablet (10 mg total) by mouth daily. Use for ten days 02/16/16   Aviva Signs, [name]  naproxen (NAPROSYN) 375 MG tablet Take 1 tablet (375 mg total) by mouth 2 (two) times daily. 05/26/17   Janne Napoleon, NP    Family History Family History  Problem Relation Age of Onset  . Thyroid disease Mother   . Alcohol abuse Neg Hx     Social History Social History   Tobacco  Use  . Smoking status: Never Smoker  . Smokeless tobacco: Never Used  Substance Use Topics  . Alcohol use: Yes    Comment: occ  . Drug use: Yes    Types: Marijuana    Comment: occasionally     Allergies   Patient has no known allergies.   Review of Systems Review of Systems  Constitutional: Negative for chills and fever.  HENT: Positive for dental problem and facial swelling. Negative for sore throat and trouble swallowing.   Hematological: Positive for adenopathy.  All other systems reviewed and are negative.    Physical Exam Updated Vital Signs BP (!) 130/97 (BP Location: Right Arm)   Pulse 88   Temp 98.4 F (36.9 C) (Oral)   Resp 16   Ht 5\' 5"  (1.651 m)   Wt 90.7 kg (200 lb)   LMP 04/20/2017 (Exact Date)   SpO2 100%   BMI 33.28 kg/m   Physical Exam  Constitutional: She appears well-developed and well-nourished. No distress.  HENT:  Right Ear: Tympanic membrane normal.  Left Ear: Tympanic  membrane normal.  Mouth/Throat: Oropharynx is clear and moist.    Left lower dental area with decay, broken 3rd molar and abscess.   Eyes: EOM are normal.  Neck: Neck supple.  Cardiovascular: Normal rate.  Pulmonary/Chest: Effort normal.  Abdominal: Soft. There is no tenderness.  Musculoskeletal: Normal range of motion.  Neurological: She is alert.  Skin: Skin is warm and dry.  Psychiatric: She has a normal mood and affect. Her behavior is normal.  Nursing note and vitals reviewed.    ED Treatments / Results  Labs (all labs ordered are listed, but only abnormal results are displayed) Labs Reviewed - No data to display  Radiology No results found.  Procedures Procedures (including critical care time)  Medications Ordered in ED Medications  oxyCODONE-acetaminophen (PERCOCET/ROXICET) 5-325 MG per tablet 1 tablet (1 tablet Oral Given 05/26/17 2014)  clindamycin (CLEOCIN) capsule 300 mg (300 mg Oral Given 05/26/17 2014)     Initial Impression / Assessment and  Plan / ED Course  I have reviewed the triage vital signs and the nursing notes. Patient with toothache and abscess of the left lower 3rd molar.  Exam unconcerning for Ludwig's angina or spread of infection.  Will treat with Clindamycinand anti-inflammatories medicine.  Urged patient to follow-up with dentist as scheduled.  Final Clinical Impressions(s) / ED Diagnoses   Final diagnoses:  Dental abscess    ED Discharge Orders        Ordered    clindamycin (CLEOCIN) 300 MG capsule  3 times daily     05/26/17 1958    HYDROcodone-acetaminophen (NORCO/VICODIN) 5-325 MG tablet  Every 6 hours PRN     05/26/17 1958    naproxen (NAPROSYN) 375 MG tablet  2 times daily     05/26/17 1958       Kerrie Buffaloeese, Jerrit Horen [place], [place] 05/26/17 2054    Raeford RazorKohut, Stephen, MD 05/27/17 1513

## 2017-05-26 NOTE — ED Notes (Signed)
Pt reports that she had swelling to her left side lower cheek that started 2 days ago and awoke this morning with sever pain to the lower back left molar. Pt reports 10/10 pain throbbing.

## 2017-05-26 NOTE — Discharge Instructions (Addendum)
Follow up with your dentist as scheduled. Do not take the narcotic if driving as it will make you sleepy.

## 2017-05-26 NOTE — ED Triage Notes (Signed)
Per PT: Pt reports the worst pain ever in her mouth. Pt has obvious swelling to the left back side of her jaw. Pt reports a cracked tooth, but is unable to get into the dentist until May.  Pt reports she cannot handle the pain

## 2017-07-27 DIAGNOSIS — H5213 Myopia, bilateral: Secondary | ICD-10-CM | POA: Diagnosis not present

## 2017-10-23 ENCOUNTER — Other Ambulatory Visit: Payer: Self-pay

## 2017-10-23 ENCOUNTER — Emergency Department (HOSPITAL_COMMUNITY)
Admission: EM | Admit: 2017-10-23 | Discharge: 2017-10-24 | Disposition: A | Discharge status: Home / Self Care | Payer: Medicaid Other | Attending: Emergency Medicine | Admitting: Emergency Medicine

## 2017-10-23 ENCOUNTER — Encounter (HOSPITAL_COMMUNITY): Payer: Self-pay | Admitting: Emergency Medicine

## 2017-10-23 DIAGNOSIS — Z79899 Other long term (current) drug therapy: Secondary | ICD-10-CM | POA: Insufficient documentation

## 2017-10-23 DIAGNOSIS — B9689 Other specified bacterial agents as the cause of diseases classified elsewhere: Secondary | ICD-10-CM | POA: Insufficient documentation

## 2017-10-23 DIAGNOSIS — D500 Iron deficiency anemia secondary to blood loss (chronic): Secondary | ICD-10-CM

## 2017-10-23 DIAGNOSIS — R1084 Generalized abdominal pain: Secondary | ICD-10-CM | POA: Insufficient documentation

## 2017-10-23 DIAGNOSIS — N760 Acute vaginitis: Secondary | ICD-10-CM | POA: Insufficient documentation

## 2017-10-23 LAB — COMPREHENSIVE METABOLIC PANEL
ALT: 45 U/L — ABNORMAL HIGH (ref 0–44)
ANION GAP: 7 (ref 5–15)
AST: 41 U/L (ref 15–41)
Albumin: 3.4 g/dL — ABNORMAL LOW (ref 3.5–5.0)
Alkaline Phosphatase: 104 U/L (ref 38–126)
BILIRUBIN TOTAL: 0.5 mg/dL (ref 0.3–1.2)
BUN: 10 mg/dL (ref 6–20)
CO2: 23 mmol/L (ref 22–32)
Calcium: 9.1 mg/dL (ref 8.9–10.3)
Chloride: 107 mmol/L (ref 98–111)
Creatinine, Ser: 0.8 mg/dL (ref 0.44–1.00)
Glucose, Bld: 110 mg/dL — ABNORMAL HIGH (ref 70–99)
POTASSIUM: 3.7 mmol/L (ref 3.5–5.1)
Sodium: 137 mmol/L (ref 135–145)
TOTAL PROTEIN: 6.7 g/dL (ref 6.5–8.1)

## 2017-10-23 LAB — URINALYSIS, ROUTINE W REFLEX MICROSCOPIC
BILIRUBIN URINE: NEGATIVE
Glucose, UA: NEGATIVE mg/dL
Hgb urine dipstick: NEGATIVE
KETONES UR: NEGATIVE mg/dL
LEUKOCYTES UA: NEGATIVE
NITRITE: NEGATIVE
PH: 6 (ref 5.0–8.0)
PROTEIN: 30 mg/dL — AB
Specific Gravity, Urine: 1.024 (ref 1.005–1.030)

## 2017-10-23 LAB — CBC
HEMATOCRIT: 30.8 % — AB (ref 36.0–46.0)
HEMOGLOBIN: 9.3 g/dL — AB (ref 12.0–15.0)
MCH: 23.4 pg — ABNORMAL LOW (ref 26.0–34.0)
MCHC: 30.2 g/dL (ref 30.0–36.0)
MCV: 77.4 fL — ABNORMAL LOW (ref 78.0–100.0)
Platelets: 369 10*3/uL (ref 150–400)
RBC: 3.98 MIL/uL (ref 3.87–5.11)
RDW: 14.6 % (ref 11.5–15.5)
WBC: 5.1 10*3/uL (ref 4.0–10.5)

## 2017-10-23 LAB — LIPASE, BLOOD: LIPASE: 82 U/L — AB (ref 11–51)

## 2017-10-23 LAB — I-STAT BETA HCG BLOOD, ED (MC, WL, AP ONLY): I-stat hCG, quantitative: <5 m[IU]/mL (ref <5)

## 2017-10-23 NOTE — ED Triage Notes (Signed)
Pt has had on and off abdominal pain and nausea since 9/9.  Also intermittent vaginal bleeding. Pt states she had bleeding for approximately a month and now resolved except when she strains to urinate she will have scant bleeding. No fever, chills, or shob.

## 2017-10-24 ENCOUNTER — Emergency Department (HOSPITAL_COMMUNITY): Payer: Medicaid Other

## 2017-10-24 LAB — POC OCCULT BLOOD, ED: Fecal Occult Bld: NEGATIVE

## 2017-10-24 LAB — WET PREP, GENITAL
SPERM: NONE SEEN
Trich, Wet Prep: NONE SEEN
YEAST WET PREP: NONE SEEN

## 2017-10-24 MED ORDER — IOHEXOL 300 MG/ML  SOLN
100.0000 mL | Freq: Once | INTRAMUSCULAR | Status: AC | PRN
  Administered 2017-10-24: 100 mL via INTRAVENOUS

## 2017-10-24 MED ORDER — ONDANSETRON HCL 4 MG/2ML IJ SOLN
4.0000 mg | Freq: Once | INTRAMUSCULAR | Status: AC
  Administered 2017-10-24: 4 mg via INTRAVENOUS
  Filled 2017-10-24: qty 2

## 2017-10-24 MED ORDER — FERROUS SULFATE 325 (65 FE) MG PO TABS: 325.0000 mg | ORAL_TABLET | Freq: Every day | ORAL | 0 refills | Status: DC

## 2017-10-24 MED ORDER — ONDANSETRON 4 MG PO TBDP: 4.0000 mg | ORAL_TABLET | Freq: Three times a day (TID) | ORAL | 0 refills | Status: DC | PRN

## 2017-10-24 MED ORDER — IOPAMIDOL (ISOVUE-300) INJECTION 61%: 100.0000 mL | Freq: Once | INTRAVENOUS | Status: DC | PRN

## 2017-10-24 MED ORDER — METRONIDAZOLE 500 MG PO TABS: 500.0000 mg | ORAL_TABLET | Freq: Two times a day (BID) | ORAL | 0 refills | Status: DC

## 2017-10-24 MED ORDER — FENTANYL CITRATE (PF) 100 MCG/2ML IJ SOLN
50.0000 ug | Freq: Once | INTRAMUSCULAR | Status: AC
  Administered 2017-10-24: 50 ug via INTRAVENOUS
  Filled 2017-10-24: qty 2

## 2017-10-24 MED ORDER — SODIUM CHLORIDE 0.9 % IV BOLUS
1000.0000 mL | Freq: Once | INTRAVENOUS | Status: AC
  Administered 2017-10-24: 1000 mL via INTRAVENOUS

## 2017-10-24 NOTE — ED Notes (Signed)
Patient transported to CT 

## 2017-10-24 NOTE — Discharge Instructions (Addendum)
Follow-up with a gynecologist regarding her irregular vaginal bleeding.  Start iron supplements.  Follow-up with your doctor.  Return to the ED if you develop new or worsening symptoms.

## 2017-10-24 NOTE — ED Notes (Signed)
ED Provider at bedside. 

## 2017-10-24 NOTE — ED Provider Notes (Signed)
[facility] EMERGENCY DEPARTMENT Provider Note   CSN: [id number] Arrival date & time: 10/23/17  2114     History   Chief Complaint Chief Complaint  Patient presents with  . Abdominal Pain  . Nausea    HPI Jessica Lutz is a 32 y.o. female.  Patient presents with upper abdominal pain that she developed on September 9.  Is associated with nausea and intermittent episodes of vomiting.  This seemed to resolve it for about a day and patient thought that she might of had a stomach bug.  States she is been having intermittent vaginal bleeding on and off for the past month but it is worse when she tries to strain her urinate.  She has had dysfunctional uterine bleeding in the past.  She presents today because her nausea has returned with upper abdominal pain and "weakness" throughout her lower stomach.  Did have one loose stool today that was nonbloody.  One episode of vomiting today.  No fever.  No pain with urination or blood in the urine.  Still having some scant vaginal bleeding.  No previous abdominal surgeries.  Does have intermittent dizziness when she stands up and some lightheadedness.  No chest pain or shortness of breath.  The history is provided by the patient.  Abdominal Pain   Associated symptoms include diarrhea, nausea and vomiting. Pertinent negatives include fever, dysuria, hematuria and headaches.    Past Medical History:  Diagnosis Date  . No pertinent past medical history     There are no active problems to display for this patient.   Past Surgical History:  Procedure Laterality Date  . NO PAST SURGERIES       OB History    Gravida  3   Para  1   Term  1   Preterm      AB  2   Living  1     SAB  2   TAB      Ectopic      Multiple      Live Births  1            Home Medications    Prior to Admission medications   Medication Sig Start Date End Date Taking? Authorizing Provider  clindamycin (CLEOCIN) 300 MG  capsule Take 1 capsule (300 mg total) by mouth 3 (three) times daily. 05/26/17   Janne Napoleon, NP  fluconazole (DIFLUCAN) 200 MG tablet Take 1 tablet (200 mg total) by mouth daily. 08/19/16   Georgiana Shore, PA-C  HYDROcodone-acetaminophen (NORCO/VICODIN) 5-325 MG tablet Take 1 tablet by mouth every 6 (six) hours as needed. 05/26/17   Janne Napoleon, NP  medroxyPROGESTERone (PROVERA) 10 MG tablet Take 1 tablet (10 mg total) by mouth daily. Use for ten days 02/16/16   Aviva Signs, [name]  naproxen (NAPROSYN) 375 MG tablet Take 1 tablet (375 mg total) by mouth 2 (two) times daily. 05/26/17   Janne Napoleon, NP    Family History Family History  Problem Relation Age of Onset  . Thyroid disease Mother   . Alcohol abuse Neg Hx     Social History Social History   Tobacco Use  . Smoking status: Never Smoker  . Smokeless tobacco: Never Used  Substance Use Topics  . Alcohol use: Yes    Comment: occ  . Drug use: Yes    Types: Marijuana    Comment: occasionally     Allergies   Patient has no known  allergies.   Review of Systems Review of Systems  Constitutional: Positive for activity change, appetite change and fatigue. Negative for fever.  HENT: Negative for congestion and rhinorrhea.   Eyes: Negative for visual disturbance.  Respiratory: Negative for cough, chest tightness and shortness of breath.   Cardiovascular: Negative for chest pain.  Gastrointestinal: Positive for abdominal pain, diarrhea, nausea and vomiting.  Genitourinary: Positive for vaginal bleeding. Negative for dysuria, hematuria, menstrual problem and vaginal discharge.  Musculoskeletal: Negative for back pain.  Skin: Negative for rash.  Neurological: Negative for dizziness, weakness and headaches.   all other systems are negative except as noted in the HPI and PMH.    Physical Exam Updated Vital Signs BP 117/88 (BP Location: Right Arm)   Pulse 67   Temp 98.1 F (36.7 C) (Oral)   Resp 14   SpO2 100%    Physical Exam  Constitutional: She is oriented to person, place, and time. She appears well-developed and well-nourished. No distress.  Appears slightly pale  HENT:  Head: Normocephalic and atraumatic.  Mouth/Throat: Oropharynx is clear and moist. No oropharyngeal exudate.  Eyes: Pupils are equal, round, and reactive to light. Conjunctivae and EOM are normal.  No conjunctival pallor  Neck: Normal range of motion. Neck supple.  No meningismus.  Cardiovascular: Normal rate, regular rhythm, normal heart sounds and intact distal pulses.  No murmur heard. Pulmonary/Chest: Effort normal and breath sounds normal. No respiratory distress. She exhibits no tenderness.  Abdominal: Soft. There is tenderness. There is no rebound and no guarding.  Epigastric tenderness.  No guarding or rebound  Genitourinary: No vaginal discharge found.  Genitourinary Comments: Chaperone present.  Scant bloody discharge in vaginal vault.  No CMT.  No lateralizing adnexal tenderness, no midline uterine tenderness  Musculoskeletal: Normal range of motion. She exhibits no edema or tenderness.  No CVAT  Neurological: She is alert and oriented to person, place, and time. No cranial nerve deficit. She exhibits normal muscle tone. Coordination normal.  No ataxia on finger to nose bilaterally. No pronator drift. 5/5 strength throughout. CN 2-12 intact.Equal grip strength. Sensation intact.   Skin: Skin is warm. Capillary refill takes less than 2 seconds. No rash noted. There is pallor.  Psychiatric: She has a normal mood and affect. Her behavior is normal.  Nursing note and vitals reviewed.    ED Treatments / Results  Labs (all labs ordered are listed, but only abnormal results are displayed) Labs Reviewed  LIPASE, BLOOD - Abnormal; Notable for the following components:      Result Value   Lipase 82 (*)    All other components within normal limits  COMPREHENSIVE METABOLIC PANEL - Abnormal; Notable for the following  components:   Glucose, Bld 110 (*)    Albumin 3.4 (*)    ALT 45 (*)    All other components within normal limits  CBC - Abnormal; Notable for the following components:   Hemoglobin 9.3 (*)    HCT 30.8 (*)    MCV 77.4 (*)    MCH 23.4 (*)    All other components within normal limits  URINALYSIS, ROUTINE W REFLEX MICROSCOPIC - Abnormal; Notable for the following components:   Protein, ur 30 (*)    Bacteria, UA RARE (*)    All other components within normal limits  WET PREP, GENITAL  I-STAT BETA HCG BLOOD, ED ([facility], [facility], [facility] ONLY)  GC/CHLAMYDIA PROBE AMP ([place]) NOT AT [facility]    EKG None  Radiology Ct Abdomen Pelvis  W Contrast  Result Date: 10/24/2017 CLINICAL DATA:  Abdominal pain and nausea for 5 days. Intermittent vaginal bleeding. EXAM: CT ABDOMEN AND PELVIS WITH CONTRAST TECHNIQUE: Multidetector CT imaging of the abdomen and pelvis was performed using the standard protocol following bolus administration of intravenous contrast. CONTRAST:  100mL OMNIPAQUE IOHEXOL 300 MG/ML  SOLN COMPARISON:  None. FINDINGS: Lower chest: Clear lung bases. Normal heart size without pericardial or pleural effusion. Hepatobiliary: Minimal motion degradation in the upper abdomen. Normal liver. Normal gallbladder, without biliary ductal dilatation. Pancreas: Normal, without mass or ductal dilatation. Spleen: Normal in size, without focal abnormality. Adrenals/Urinary Tract: Normal adrenal glands. Normal kidneys, without hydronephrosis. Decompressed urinary bladder. Stomach/Bowel: Normal stomach, without wall thickening. Normal colon and terminal ileum. Appendix positioned anteriorly, including on image 35/3. Normal small bowel. Vascular/Lymphatic: Aortic atherosclerosis. No abdominopelvic adenopathy. Reproductive: Retroverted uterus. Tampon in place. No adnexal mass. Other: No significant free fluid.  No free intraperitoneal air. Musculoskeletal: Transitional lumbosacral anatomy. IMPRESSION: 1.  No acute process  in the abdomen or pelvis. 2. Minimal motion degradation in the upper abdomen. Electronically Signed   By: Jeronimo GreavesKyle  Talbot M.D.   On: 10/24/2017 01:39    Procedures Procedures (including critical care time)  Medications Ordered in ED Medications  sodium chloride 0.9 % bolus 1,000 mL (has no administration in time range)  ondansetron (ZOFRAN) injection 4 mg (has no administration in time range)  fentaNYL (SUBLIMAZE) injection 50 mcg (has no administration in time range)     Initial Impression / Assessment and Plan / ED Course  I have reviewed the triage vital signs and the nursing notes.  Pertinent labs & imaging results that were available during my care of the patient were reviewed by me and considered in my medical decision making (see chart for details).    Upper abdominal pain with nausea and intermittent vaginal bleeding.  Patient with stable vitals in no distress.  hCG is negative.  Hemoglobin is 9.8 which is decreased 3 g in the past year.  Pelvic exam performed as above with minimal vaginal bleeding.  Hemoccult is negative.  Orthostatics are negative. CT scan shows no acute pathology.  Suspect patient's anemia has been gradual due to chronic blood loss due to vaginal bleeding. No evidence of GI bleed.  Patient has had dysfunctional uterine bleeding in the past.  She has no significant pelvic tenderness on exam today.  Her work-up is reassuring.  We will have her start iron as well as treat bacterial vaginosis. Follow-up with her gynecologist for likely consideration of starting OCPs.  Return precautions discussed.  Pulse ox of 87% noted at discharge is likely spurious as patient has had O2 sat of 100% throughout her ED stay. She did not have any chest pain or SOB.   Final Clinical Impressions(s) / ED Diagnoses   Final diagnoses:  Generalized abdominal pain  Iron deficiency anemia due to chronic blood loss  Bacterial vaginosis    ED Discharge Orders    None         Mayia Megill, Jeannett SeniorStephen, MD 10/24/17 [phone]

## 2017-10-25 LAB — GC/CHLAMYDIA PROBE AMP (~~LOC~~) NOT AT ARMC
CHLAMYDIA, DNA PROBE: NEGATIVE
NEISSERIA GONORRHEA: NEGATIVE

## 2018-03-22 DIAGNOSIS — D649 Anemia, unspecified: Secondary | ICD-10-CM | POA: Insufficient documentation

## 2018-03-22 HISTORY — DX: Anemia, unspecified: D64.9

## 2018-08-13 ENCOUNTER — Encounter (HOSPITAL_COMMUNITY): Payer: Self-pay

## 2018-08-13 ENCOUNTER — Telehealth: Payer: Self-pay

## 2018-08-13 ENCOUNTER — Ambulatory Visit (HOSPITAL_COMMUNITY)
Admission: EM | Admit: 2018-08-13 | Discharge: 2018-08-13 | Disposition: A | Discharge status: Home / Self Care | Payer: Medicaid Other | Attending: Family Medicine | Admitting: Family Medicine

## 2018-08-13 ENCOUNTER — Other Ambulatory Visit: Payer: Self-pay

## 2018-08-13 DIAGNOSIS — R05 Cough: Secondary | ICD-10-CM

## 2018-08-13 DIAGNOSIS — Z20822 Contact with and (suspected) exposure to covid-19: Secondary | ICD-10-CM

## 2018-08-13 DIAGNOSIS — Z20828 Contact with and (suspected) exposure to other viral communicable diseases: Secondary | ICD-10-CM

## 2018-08-13 NOTE — ED Provider Notes (Signed)
[place] [facility]    CSN: [id number] Arrival date & time: 08/13/18  1559      History   Chief Complaint Chief Complaint  Patient presents with  . Appointment  . (3:50  Cough )    HPI Jessica Lutz is a 33 y.o. female.   Patient presents today with nonproductive cough x1 day.  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  She denies fever, chills, shortness of breath, sore throat, ear pain, nausea, vomiting, abdominal pain, diarrhea, other symptoms.  LMP: Current.  The history is provided by the patient.    Past Medical History:  Diagnosis Date  . No pertinent past medical history     There are no active problems to display for this patient.   Past Surgical History:  Procedure Laterality Date  . NO PAST SURGERIES      OB History    Gravida  3   Para  1   Term  1   Preterm      AB  2   Living  1     SAB  2   TAB      Ectopic      Multiple      Live Births  1            Home Medications    Prior to Admission medications   Medication Sig Start Date End Date Taking? Authorizing Provider  ferrous sulfate 325 (65 FE) MG tablet Take 1 tablet (325 mg total) by mouth daily. 10/24/17   Rancour, Annie Main, MD  HYDROcodone-acetaminophen (NORCO/VICODIN) 5-325 MG tablet Take 1 tablet by mouth every 6 (six) hours as needed. Patient not taking: Reported on 10/24/2017 05/26/17   Ashley Murrain, NP  medroxyPROGESTERone (PROVERA) 10 MG tablet Take 1 tablet (10 mg total) by mouth daily. Use for ten days Patient not taking: Reported on 10/24/2017 02/16/16   Seabron Spates, [name]  metroNIDAZOLE (FLAGYL) 500 MG tablet Take 1 tablet (500 mg total) by mouth 2 (two) times daily. 10/24/17   Rancour, Annie Main, MD  naproxen (NAPROSYN) 375 MG tablet Take 1 tablet (375 mg total) by mouth 2 (two) times daily. Patient not taking: Reported on 10/24/2017 05/26/17   Ashley Murrain, NP  ondansetron  (ZOFRAN ODT) 4 MG disintegrating tablet Take 1 tablet (4 mg total) by mouth every 8 (eight) hours as needed for nausea or vomiting. 10/24/17   Ezequiel Essex, MD    Family History Family History  Problem Relation Age of Onset  . Thyroid disease Mother   . Alcohol abuse Neg Hx     Social History Social History   Tobacco Use  . Smoking status: Never Smoker  . Smokeless tobacco: Never Used  Substance Use Topics  . Alcohol use: Yes    Comment: occ  . Drug use: Yes    Types: Marijuana    Comment: occasionally     Allergies   Patient has no known allergies.   Review of Systems Review of Systems  Constitutional: Negative for chills and fever.  HENT: Negative for ear pain and sore throat.   Eyes: Negative for pain and visual disturbance.  Respiratory: Positive for cough. Negative for shortness of breath.   Cardiovascular: Negative for chest pain and palpitations.  Gastrointestinal: Negative for abdominal pain and vomiting.  Genitourinary: Negative for dysuria and hematuria.  Musculoskeletal: Negative for arthralgias and back  pain.  Skin: Negative for color change and rash.  Neurological: Negative for seizures and syncope.  All other systems reviewed and are negative.    Physical Exam Triage Vital Signs ED Triage Vitals [08/13/18 1615]  Enc Vitals Group     BP 110/81     Pulse Rate 91     Resp 17     Temp 98.3 F (36.8 C)     Temp Source Oral     SpO2 99 %     Weight      Height      Head Circumference      Peak Flow      Pain Score 0     Pain Loc      Pain Edu?      Excl. in GC?    No data found.  Updated Vital Signs BP 110/81 (BP Location: Right Arm)   Pulse 91   Temp 98.3 F (36.8 C) (Oral)   Resp 17   LMP 08/12/2018   SpO2 99%   Visual Acuity Right Eye Distance:   Left Eye Distance:   Bilateral Distance:    Right Eye Near:   Left Eye Near:    Bilateral Near:     Physical Exam Vitals signs and nursing note reviewed.  Constitutional:       General: She is not in acute distress.    Appearance: She is well-developed.  HENT:     Head: Normocephalic and atraumatic.     Right Ear: Tympanic membrane normal.     Left Ear: Tympanic membrane normal.     Nose: Nose normal.     Mouth/Throat:     Mouth: Mucous membranes are moist.     Pharynx: No oropharyngeal exudate or posterior oropharyngeal erythema.  Eyes:     Conjunctiva/sclera: Conjunctivae normal.  Neck:     Musculoskeletal: Neck supple.  Cardiovascular:     Rate and Rhythm: Normal rate and regular rhythm.     Heart sounds: No murmur.  Pulmonary:     Effort: Pulmonary effort is normal. No respiratory distress.     Breath sounds: Normal breath sounds.  Abdominal:     Palpations: Abdomen is soft.     Tenderness: There is no abdominal tenderness.  Skin:    General: Skin is warm and dry.  Neurological:     Mental Status: She is alert.      UC Treatments / Results  Labs (all labs ordered are listed, but only abnormal results are displayed) Labs Reviewed - No data to display  EKG   Radiology No results found.  Procedures Procedures (including critical care time)  Medications Ordered in UC Medications - No data to display  Initial Impression / Assessment and Plan / UC Course  I have reviewed the triage vital signs and the nursing notes.  Pertinent labs & imaging results that were available during my care of the patient were reviewed by me and considered in my medical decision making (see chart for details).   Suspected COVID.  She believes she was exposed and request COVID testing which has been ordered.  Discussed with patient the need to self quarantine while waiting for test results.  Discussed that she should return here or to the emergency department if she develops worsening cough, fever, chills, shortness of breath, sore throat, earache, vomiting, abdominal pain, diarrhea, or other concerning symptoms.   Final Clinical Impressions(s) / UC  Diagnoses   Final diagnoses:  None   Discharge Instructions  None    ED Prescriptions    None     Controlled Substance Prescriptions [place] Controlled Substance Registry consulted? Not Applicable   Mickie Bailate, Ethyle Tiedt H, NP 08/13/18 1655

## 2018-08-13 NOTE — Telephone Encounter (Signed)
Called pt and scheduled testing at [facility]  08/15/18 at 2:15 pm. Pt instructed to stay in car and wear mask at testing site. Pt verbalized understanding.

## 2018-08-13 NOTE — Discharge Instructions (Signed)
You should receive a call to schedule your COVID test; has been ordered.  There may be a delay due to the holiday.  You should self quarantine until the test results are back.  Return here or follow-up in the emergency department if you develop worsening cough, fever, chills, shortness of breath, sore throat, earache, vomiting, abdominal pain, diarrhea, other concerning symptoms.

## 2018-08-13 NOTE — Telephone Encounter (Signed)
-----   Message from Sharion Balloon, NP sent at 08/13/2018  4:45 PM EDT ----- Regarding: needs COVID test

## 2018-08-13 NOTE — ED Triage Notes (Signed)
Pt presents with non productive cough since yesterday. 

## 2018-08-15 ENCOUNTER — Other Ambulatory Visit: Payer: Self-pay

## 2018-08-15 DIAGNOSIS — Z20822 Contact with and (suspected) exposure to covid-19: Secondary | ICD-10-CM

## 2018-08-20 LAB — NOVEL CORONAVIRUS, NAA: SARS-CoV-2, NAA: NOT DETECTED

## 2018-08-29 ENCOUNTER — Telehealth (HOSPITAL_COMMUNITY): Payer: Self-pay | Admitting: Emergency Medicine

## 2018-08-29 NOTE — Telephone Encounter (Signed)
Pt called requesting a work note, sent in [employer]

## 2019-02-10 NOTE — L&D Delivery Note (Signed)
OB/GYN Faculty Practice Delivery Note  Jessica Lutz is a 34 y.o. [id number] s/p VD 2/2 IOL for PreE w/o SF at [redacted]w[redacted]d. Patient was asymptomatic and COVID + on admission.  AROM: 2h 31m with clear fluid GBS Status:  POSITIVE/-- (09/29 0821) Maximum Maternal Temperature: 98.7 F  Labor Progress: . Patient presented to L&D for IOL. Initial SVE: 4-5/70/-2. Augmented with pitocin, AROM. Labor course was uncomplicated. She then progressed to complete.   Delivery Date/Time: 11/25/19 @ 0801 Delivery: Called to room and patient was complete and pushing. Head position was ROA and delivered with ease over the perineum. No nuchal cord present. Shoulder and body delivered in usual fashion. Infant with spontaneous cry, placed on mother's abdomen, dried and stimulated. Cord clamped x 2 after 1-minute delay, and cut by pt's mother. Cord blood drawn. Placenta delivered spontaneously with gentle cord traction. Fundus firm with massage and pitocin started. Labia, perineum, vagina, and cervix inspected and significant for no tears.  Baby Weight: per chart  Cord: central insertion, 3 vessel Placenta: Sent to L&D, intact Complications: None Lacerations: none EBL: 400 cc Analgesia: Epidural   Infant: APGAR (1 MIN): 9   APGAR (5 MINS): 9   Herby Abraham MD, PGY-1 OBGYN Faculty Teaching Service  11/25/2019, 8:27 AM

## 2019-05-01 ENCOUNTER — Other Ambulatory Visit: Payer: Self-pay

## 2019-05-01 DIAGNOSIS — N926 Irregular menstruation, unspecified: Secondary | ICD-10-CM | POA: Insufficient documentation

## 2019-05-01 DIAGNOSIS — Z3201 Encounter for pregnancy test, result positive: Secondary | ICD-10-CM | POA: Insufficient documentation

## 2019-05-02 ENCOUNTER — Other Ambulatory Visit: Payer: Self-pay

## 2019-05-02 ENCOUNTER — Encounter (HOSPITAL_COMMUNITY): Payer: Self-pay | Admitting: Emergency Medicine

## 2019-05-02 ENCOUNTER — Inpatient Hospital Stay (HOSPITAL_COMMUNITY)
Admission: EM | Admit: 2019-05-02 | Discharge: 2019-05-02 | Disposition: A | Discharge status: Home / Self Care | Payer: Medicaid Other | Attending: Obstetrics & Gynecology | Admitting: Obstetrics & Gynecology

## 2019-05-02 DIAGNOSIS — Z3201 Encounter for pregnancy test, result positive: Secondary | ICD-10-CM

## 2019-05-02 DIAGNOSIS — N926 Irregular menstruation, unspecified: Secondary | ICD-10-CM | POA: Diagnosis not present

## 2019-05-02 LAB — HCG, QUANTITATIVE, PREGNANCY: hCG, Beta Chain, Quant, S: 61005 m[IU]/mL — ABNORMAL HIGH (ref <5)

## 2019-05-02 NOTE — ED Triage Notes (Signed)
Patient requesting pregnancy test , LMP= 02/25/19.

## 2019-05-02 NOTE — MAU Provider Note (Addendum)
Jessica Lutz is a 34 y.o. [id number] at [redacted]w[redacted]d who presents to MAU today for pregnancy verification and to find out how far long she is. She reports irregular periods and is also concerned over paternity. The patient denies abdominal pain or vaginal bleeding today. She was seen in the [facility] and qhcg was 61k. She was sent to MAU for dating.  BP 114/78   Pulse 81   Temp 98.4 F (36.9 C)   Resp 17   Ht 5\' 6"  (1.676 m)   Wt 100.9 kg   LMP 03/07/2019   SpO2 100%   BMI 35.90 kg/m   CONSTITUTIONAL: Well-developed, well-nourished female in no acute distress.  MUSCULOSKELETAL: Normal range of motion.  CARDIOVASCULAR: Regular heart rate RESPIRATORY: Normal effort NEUROLOGICAL: Alert and oriented to person, place, and time.  SKIN: No pallor. PSYCH: Normal mood and affect. Normal behavior. Normal judgment and thought content.  Results for orders placed or performed during the hospital encounter of 05/02/19 (from the past 24 hour(s))  hCG, quantitative, pregnancy     Status: Abnormal   Collection Time: 05/02/19 12:15 AM  Result Value Ref Range   hCG, Beta Chain, Quant, S 61,005 (H) <5 mIU/mL   MDM Patient advised that without concerning symptoms today 05/04/19 cannot be offered. She may request this from her OBGYN who she thinks may be [facility].  She is stable for discharge home.  A: Positive pregnancy test  P: Discharge home Pregnancy verification letter provided Follow up with primary OBGYN to start care Patient may return to MAU as needed or if her condition were to change or worsen   [place], Donette Larry  05/02/2019 2:40 AM

## 2019-05-02 NOTE — MAU Note (Signed)
Patient found out she was pregnant.  States she has irregular periods and wants to know how far along she is.  Denies VB or pain.  LMP 03/07/19.  States shes had some daily nausea but no vomiting.

## 2019-05-03 ENCOUNTER — Telehealth (INDEPENDENT_AMBULATORY_CARE_PROVIDER_SITE_OTHER): Payer: Self-pay | Admitting: *Deleted

## 2019-05-03 DIAGNOSIS — O219 Vomiting of pregnancy, unspecified: Secondary | ICD-10-CM

## 2019-05-03 MED ORDER — PROMETHAZINE HCL 25 MG PO TABS: 25.0000 mg | ORAL_TABLET | Freq: Four times a day (QID) | ORAL | 0 refills | Status: DC | PRN

## 2019-05-03 NOTE — Telephone Encounter (Signed)
Pt left VM message stating that she spoke with someone yesterday @ [facility] maternity assessment dept because she was not feeling well. She requests a call back from a nurse.

## 2019-05-03 NOTE — Telephone Encounter (Signed)
Called pt. Pt states she has an upcoming prenatal appt with our office. Pt reports nausea and vomiting; explained to pt I can send Phenergan 25 mg to her pharmacy that she may take every 6 hours. Rx ordered per protocol. Pt states she is having a headache, but is unsure which medications are safe to take during pregnancy. Recommended pt take 2 extra strength Tylenol and rest for 1 hour. Instructed pt to call the office if she is still having a headache at 1640, 1 hour from initial phone call. Encouraged good hydration and small, frequent meals.

## 2019-05-29 ENCOUNTER — Other Ambulatory Visit: Payer: Self-pay

## 2019-05-29 ENCOUNTER — Ambulatory Visit (INDEPENDENT_AMBULATORY_CARE_PROVIDER_SITE_OTHER): Payer: Self-pay | Admitting: *Deleted

## 2019-05-29 DIAGNOSIS — O99210 Obesity complicating pregnancy, unspecified trimester: Secondary | ICD-10-CM

## 2019-05-29 DIAGNOSIS — Z3490 Encounter for supervision of normal pregnancy, unspecified, unspecified trimester: Secondary | ICD-10-CM | POA: Insufficient documentation

## 2019-05-29 HISTORY — DX: Encounter for supervision of normal pregnancy, unspecified, unspecified trimester: Z34.90

## 2019-05-29 HISTORY — DX: Obesity complicating pregnancy, unspecified trimester: O99.210

## 2019-05-29 NOTE — Progress Notes (Signed)
I connected with  [name] on 05/29/19 at  3:30 PM EDT by telephone and verified that I am speaking with the correct person using two identifiers.   I discussed the limitations, risks, security and privacy concerns of performing an evaluation and management service by telephone and the availability of in person appointments. I also discussed with the patient that there may be a patient responsible charge related to this service. The patient expressed understanding and agreed to proceed.  I explained I am completing her New OB Intake today. We discussed Her EDD and that it is based on  sure LMP. States has irregular periods ; but is sure of date of LMP.  . I reviewed her allergies, meds, OB History, Medical /Surgical history, and appropriate screenings. I informed her of [facility] services.  I explained I will send her the Bab[employer] app and app was sent to her while on phone- she will download later.  I explained we will send a blood pressure cuff to [facility] once she has active [place] Medicaid ( had Family Planning Medicaid and is trying to get Pregnancy Medicaid). I asked her to let nursing staff know once her [place] Medicaid is approved and we will order a blood pressure cuff RX. I  Explained  then we will have her take her blood pressure weekly and enter into the app. I explained she will have some visits in office and some virtually. She already has Sports coach. I reviewed her new ob  appointment date/ time with her , our location and to wear mask, no visitors.  I explained she will have a pelvic exam, ob bloodwork, hemoglobin a1C, cbg ,pap, and  genetic testing if desired,- she is undecided about t a panorama. I scheduled an [place] at 19 weeks. She voices understanding.   Jennea Rager,RN 05/29/2019  3:22 PM

## 2019-05-29 NOTE — Patient Instructions (Signed)

## 2019-06-01 ENCOUNTER — Ambulatory Visit (INDEPENDENT_AMBULATORY_CARE_PROVIDER_SITE_OTHER): Payer: Medicaid Other | Admitting: Obstetrics and Gynecology

## 2019-06-01 ENCOUNTER — Other Ambulatory Visit: Payer: Self-pay

## 2019-06-01 ENCOUNTER — Other Ambulatory Visit (HOSPITAL_COMMUNITY)
Admission: RE | Admit: 2019-06-01 | Discharge: 2019-06-01 | Disposition: A | Discharge status: Home / Self Care | Payer: Medicaid Other | Source: Ambulatory Visit | Attending: Obstetrics and Gynecology | Admitting: Obstetrics and Gynecology

## 2019-06-01 ENCOUNTER — Encounter: Payer: Self-pay | Admitting: Obstetrics and Gynecology

## 2019-06-01 DIAGNOSIS — Z3491 Encounter for supervision of normal pregnancy, unspecified, first trimester: Secondary | ICD-10-CM

## 2019-06-01 DIAGNOSIS — Z3A12 12 weeks gestation of pregnancy: Secondary | ICD-10-CM | POA: Insufficient documentation

## 2019-06-01 DIAGNOSIS — O09291 Supervision of pregnancy with other poor reproductive or obstetric history, first trimester: Secondary | ICD-10-CM | POA: Diagnosis not present

## 2019-06-01 DIAGNOSIS — Z3481 Encounter for supervision of other normal pregnancy, first trimester: Secondary | ICD-10-CM | POA: Diagnosis not present

## 2019-06-01 NOTE — Progress Notes (Signed)
Pt still waiting for Pregnancy Medicaid, if not rcv by next visit, please give BP Cuff.

## 2019-06-01 NOTE — Progress Notes (Signed)
Subjective:  Jessica Lutz is a 34 y.o. W4R1540 at [redacted]w[redacted]d being seen today for her first OB appt. EDD by certain EDD. Denies chronic medical problems or medications. H/O TSVD without problems. H/O first trimester SAB.  She is currently monitored for the following issues for this low-risk pregnancy and has Supervision of low-risk pregnancy and Obesity affecting pregnancy on their problem list.  Patient reports no complaints.  Contractions: Not present. Vag. Bleeding: None.  Movement: Absent. Denies leaking of fluid.   The following portions of the patient's history were reviewed and updated as appropriate: allergies, current medications, past family history, past medical history, past social history, past surgical history and problem list. Problem list updated.  Objective:   Vitals:   06/01/19 1035  BP: 109/75  Pulse: 90  Weight: 225 lb 4.8 oz (102.2 kg)    Fetal Status: Fetal Heart Rate (bpm): 164   Movement: Absent     General:  Alert, oriented and cooperative. Patient is in no acute distress.  Skin: Skin is warm and dry. No rash noted.   Cardiovascular: Normal heart rate noted  Respiratory: Normal respiratory effort, no problems with respiration noted  Abdomen: Soft, gravid, appropriate for gestational age. Pain/Pressure: Absent     Pelvic:  Cervical exam performed        Extremities: Normal range of motion.  Edema: None  Mental Status: Normal mood and affect. Normal behavior. Normal judgment and thought content.  Breast sym supple no nipple discharge, masses or adenopathy  Urinalysis:      Assessment and Plan:  Pregnancy: G4P1021 at [redacted]w[redacted]d  1. Encounter for supervision of low-risk pregnancy in first trimester Prenatal care and labs reviewed with pt Genetic testing discussed BP cuff and Baby Rx has been ordered BP monitoring reviewed with pt Anatomy scan has been ordered - Culture, OB Urine - Genetic Screening - Obstetric Panel, Including HIV - Hemoglobin A1c - Cytology  - PAP( [place])  Preterm labor symptoms and general obstetric precautions including but not limited to vaginal bleeding, contractions, leaking of fluid and fetal movement were reviewed in detail with the patient. Please refer to After Visit Summary for other counseling recommendations.  Return in about 4 weeks (around 06/29/2019) for OB visit, virtual, any provider.   Hermina Staggers, MD

## 2019-06-01 NOTE — Patient Instructions (Addendum)
First Trimester of Pregnancy The first trimester of pregnancy is from week 1 until the end of week 13 (months 1 through 3). A week after a sperm fertilizes an egg, the egg will implant on the wall of the uterus. This embryo will begin to develop into a baby. Genes from you and your partner will form the baby. The female genes will determine whether the baby will be a boy or a girl. At 6-8 weeks, the eyes and face will be formed, and the heartbeat can be seen on ultrasound. At the end of 12 weeks, all the baby's organs will be formed. Now that you are pregnant, you will want to do everything you can to have a healthy baby. Two of the most important things are to get good prenatal care and to follow your health care provider's instructions. Prenatal care is all the medical care you receive before the baby's birth. This care will help prevent, find, and treat any problems during the pregnancy and childbirth. Body changes during your first trimester Your body goes through many changes during pregnancy. The changes vary from woman to woman.  You may gain or lose a couple of pounds at first.  You may feel sick to your stomach (nauseous) and you may throw up (vomit). If the vomiting is uncontrollable, call your health care provider.  You may tire easily.  You may develop headaches that can be relieved by medicines. All medicines should be approved by your health care provider.  You may urinate more often. Painful urination may mean you have a bladder infection.  You may develop heartburn as a result of your pregnancy.  You may develop constipation because certain hormones are causing the muscles that push stool through your intestines to slow down.  You may develop hemorrhoids or swollen veins (varicose veins).  Your breasts may begin to grow larger and become tender. Your nipples may stick out more, and the tissue that surrounds them (areola) may become darker.  Your gums may bleed and may be  sensitive to brushing and flossing.  Dark spots or blotches (chloasma, mask of pregnancy) may develop on your face. This will likely fade after the baby is born.  Your menstrual periods will stop.  You may have a loss of appetite.  You may develop cravings for certain kinds of food.  You may have changes in your emotions from day to day, such as being excited to be pregnant or being concerned that something may go wrong with the pregnancy and baby.  You may have more vivid and strange dreams.  You may have changes in your hair. These can include thickening of your hair, rapid growth, and changes in texture. Some women also have hair loss during or after pregnancy, or hair that feels dry or thin. Your hair will most likely return to normal after your baby is born. What to expect at prenatal visits During a routine prenatal visit:  You will be weighed to make sure you and the baby are growing normally.  Your blood pressure will be taken.  Your abdomen will be measured to track your baby's growth.  The fetal heartbeat will be listened to between weeks 10 and 14 of your pregnancy.  Test results from any previous visits will be discussed. Your health care provider may ask you:  How you are feeling.  If you are feeling the baby move.  If you have had any abnormal symptoms, such as leaking fluid, bleeding, severe headaches, or abdominal   cramping.  If you are using any tobacco products, including cigarettes, chewing tobacco, and electronic cigarettes.  If you have any questions. Other tests that may be performed during your first trimester include:  Blood tests to find your blood type and to check for the presence of any previous infections. The tests will also be used to check for low iron levels (anemia) and protein on red blood cells (Rh antibodies). Depending on your risk factors, or if you previously had diabetes during pregnancy, you may have tests to check for high blood sugar  that affects pregnant women (gestational diabetes).  Urine tests to check for infections, diabetes, or protein in the urine.  An ultrasound to confirm the proper growth and development of the baby.  Fetal screens for spinal cord problems (spina bifida) and Down syndrome.  HIV (human immunodeficiency virus) testing. Routine prenatal testing includes screening for HIV, unless you choose not to have this test.  You may need other tests to make sure you and the baby are doing well. Follow these instructions at home: Medicines  Follow your health care provider's instructions regarding medicine use. Specific medicines may be either safe or unsafe to take during pregnancy.  Take a prenatal vitamin that contains at least 600 micrograms (mcg) of folic acid.  If you develop constipation, try taking a stool softener if your health care provider approves. Eating and drinking   Eat a balanced diet that includes fresh fruits and vegetables, whole grains, good sources of protein such as meat, eggs, or tofu, and low-fat dairy. Your health care provider will help you determine the amount of weight gain that is right for you.  Avoid raw meat and uncooked cheese. These carry germs that can cause birth defects in the baby.  Eating four or five small meals rather than three large meals a day may help relieve nausea and vomiting. If you start to feel nauseous, eating a few soda crackers can be helpful. Drinking liquids between meals, instead of during meals, also seems to help ease nausea and vomiting.  Limit foods that are high in fat and processed sugars, such as fried and sweet foods.  To prevent constipation: ? Eat foods that are high in fiber, such as fresh fruits and vegetables, whole grains, and beans. ? Drink enough fluid to keep your urine clear or pale yellow. Activity  Exercise only as directed by your health care provider. Most women can continue their usual exercise routine during  pregnancy. Try to exercise for 30 minutes at least 5 days a week. Exercising will help you: ? Control your weight. ? Stay in shape. ? Be prepared for labor and delivery.  Experiencing pain or cramping in the lower abdomen or lower back is a good sign that you should stop exercising. Check with your health care provider before continuing with normal exercises.  Try to avoid standing for long periods of time. Move your legs often if you must stand in one place for a long time.  Avoid heavy lifting.  Wear low-heeled shoes and practice good posture.  You may continue to have sex unless your health care provider tells you not to. Relieving pain and discomfort  Wear a good support bra to relieve breast tenderness.  Take warm sitz baths to soothe any pain or discomfort caused by hemorrhoids. Use hemorrhoid cream if your health care provider approves.  Rest with your legs elevated if you have leg cramps or low back pain.  If you develop varicose veins in   your legs, wear support hose. Elevate your feet for 15 minutes, 3-4 times a day. Limit salt in your diet. Prenatal care  Schedule your prenatal visits by the twelfth week of pregnancy. They are usually scheduled monthly at first, then more often in the last 2 months before delivery.  Write down your questions. Take them to your prenatal visits.  Keep all your prenatal visits as told by your health care provider. This is important. Safety  Wear your seat belt at all times when driving.  Make a list of emergency phone numbers, including numbers for family, friends, the hospital, and police and fire departments. General instructions  Ask your health care provider for a referral to a local prenatal education class. Begin classes no later than the beginning of month 6 of your pregnancy.  Ask for help if you have counseling or nutritional needs during pregnancy. Your health care provider can offer advice or refer you to specialists for help  with various needs.  Do not use hot tubs, steam rooms, or saunas.  Do not douche or use tampons or scented sanitary pads.  Do not cross your legs for long periods of time.  Avoid cat litter boxes and soil used by cats. These carry germs that can cause birth defects in the baby and possibly loss of the fetus by miscarriage or stillbirth.  Avoid all smoking, herbs, alcohol, and medicines not prescribed by your health care provider. Chemicals in these products affect the formation and growth of the baby.  Do not use any products that contain nicotine or tobacco, such as cigarettes and e-cigarettes. If you need help quitting, ask your health care provider. You may receive counseling support and other resources to help you quit.  Schedule a dentist appointment. At home, brush your teeth with a soft toothbrush and be gentle when you floss. Contact a health care provider if:  You have dizziness.  You have mild pelvic cramps, pelvic pressure, or nagging pain in the abdominal area.  You have persistent nausea, vomiting, or diarrhea.  You have a bad smelling vaginal discharge.  You have pain when you urinate.  You notice increased swelling in your face, hands, legs, or ankles.  You are exposed to fifth disease or chickenpox.  You are exposed to German measles (rubella) and have never had it. Get help right away if:  You have a fever.  You are leaking fluid from your vagina.  You have spotting or bleeding from your vagina.  You have severe abdominal cramping or pain.  You have rapid weight gain or loss.  You vomit blood or material that looks like coffee grounds.  You develop a severe headache.  You have shortness of breath.  You have any kind of trauma, such as from a fall or a car accident. Summary  The first trimester of pregnancy is from week 1 until the end of week 13 (months 1 through 3).  Your body goes through many changes during pregnancy. The changes vary from  woman to woman.  You will have routine prenatal visits. During those visits, your health care provider will examine you, discuss any test results you may have, and talk with you about how you are feeling. This information is not intended to replace advice given to you by your health care provider. Make sure you discuss any questions you have with your health care provider. Document Revised: 01/08/2017 Document Reviewed: 01/08/2016 Elsevier Patient Education  2020 Elsevier Inc.  

## 2019-06-01 NOTE — Progress Notes (Signed)
Medicaid Home Form Completed-06/01/19 

## 2019-06-02 LAB — OBSTETRIC PANEL, INCLUDING HIV
Antibody Screen: NEGATIVE
Basophils Absolute: 0 10*3/uL (ref 0.0–0.2)
Basos: 0 %
EOS (ABSOLUTE): 0.1 10*3/uL (ref 0.0–0.4)
Eos: 1 %
HIV Screen 4th Generation wRfx: NONREACTIVE
Hematocrit: 38.2 % (ref 34.0–46.6)
Hemoglobin: 12.6 g/dL (ref 11.1–15.9)
Hepatitis B Surface Ag: NEGATIVE
Immature Grans (Abs): 0 10*3/uL (ref 0.0–0.1)
Immature Granulocytes: 0 %
Lymphocytes Absolute: 2.6 10*3/uL (ref 0.7–3.1)
Lymphs: 31 %
MCH: 27.4 pg (ref 26.6–33.0)
MCHC: 33 g/dL (ref 31.5–35.7)
MCV: 83 fL (ref 79–97)
Monocytes Absolute: 0.6 10*3/uL (ref 0.1–0.9)
Monocytes: 8 %
Neutrophils Absolute: 5 10*3/uL (ref 1.4–7.0)
Neutrophils: 60 %
Platelets: 293 10*3/uL (ref 150–450)
RBC: 4.6 x10E6/uL (ref 3.77–5.28)
RDW: 13.4 % (ref 11.7–15.4)
RPR Ser Ql: NONREACTIVE
Rh Factor: POSITIVE
Rubella Antibodies, IGG: 2.19 index (ref >0.99)
WBC: 8.4 10*3/uL (ref 3.4–10.8)

## 2019-06-02 LAB — HEMOGLOBIN A1C
Est. average glucose Bld gHb Est-mCnc: 117 mg/dL
Hgb A1c MFr Bld: 5.7 % — ABNORMAL HIGH (ref 4.8–5.6)

## 2019-06-03 LAB — CULTURE, OB URINE

## 2019-06-03 LAB — URINE CULTURE, OB REFLEX

## 2019-06-05 LAB — CYTOLOGY - PAP
Chlamydia: NEGATIVE
Comment: NEGATIVE
Comment: NEGATIVE
Comment: NORMAL
Diagnosis: NEGATIVE
High risk HPV: NEGATIVE
Neisseria Gonorrhea: NEGATIVE

## 2019-06-06 ENCOUNTER — Encounter: Payer: Self-pay | Admitting: *Deleted

## 2019-06-13 ENCOUNTER — Encounter: Payer: Self-pay | Admitting: *Deleted

## 2019-06-19 ENCOUNTER — Encounter: Payer: Self-pay | Admitting: *Deleted

## 2019-06-30 ENCOUNTER — Other Ambulatory Visit: Payer: Self-pay

## 2019-06-30 ENCOUNTER — Telehealth (INDEPENDENT_AMBULATORY_CARE_PROVIDER_SITE_OTHER): Payer: Medicaid Other | Admitting: Medical

## 2019-06-30 DIAGNOSIS — Z3492 Encounter for supervision of normal pregnancy, unspecified, second trimester: Secondary | ICD-10-CM

## 2019-06-30 DIAGNOSIS — D563 Thalassemia minor: Secondary | ICD-10-CM

## 2019-06-30 DIAGNOSIS — O99210 Obesity complicating pregnancy, unspecified trimester: Secondary | ICD-10-CM

## 2019-06-30 MED ORDER — PRENATAL PLUS 27-1 MG PO TABS: 1.0000 | ORAL_TABLET | Freq: Every day | ORAL | 3 refills | Status: DC

## 2019-06-30 MED ORDER — BLOOD PRESSURE MONITORING DEVI: 1.0000 | 0 refills | Status: DC

## 2019-06-30 MED ORDER — ASPIRIN EC 81 MG PO TBEC: 81.0000 mg | DELAYED_RELEASE_TABLET | Freq: Every day | ORAL | 6 refills | Status: DC

## 2019-06-30 NOTE — Progress Notes (Signed)
I connected with Jessica Lutz on 06/30/19 at  8:35 AM EDT by: MyChart video and verified that I am speaking with the correct person using two identifiers.  Patient is located at home and provider is located at [facility].     The purpose of this virtual visit is to provide medical care while limiting exposure to the novel coronavirus. I discussed the limitations, risks, security and privacy concerns of performing an evaluation and management service by MyChart video and the availability of in person appointments. I also discussed with the patient that there may be a patient responsible charge related to this service. By engaging in this virtual visit, you consent to the provision of healthcare.  Additionally, you authorize for your insurance to be billed for the services provided during this visit.  The patient expressed understanding and agreed to proceed.  The following staff members participated in the virtual visit:  Corinda Gubler, CMA    PRENATAL VISIT NOTE  Subjective:  Jessica Lutz is a 34 y.o. [id number] at [redacted]w[redacted]d  for phone visit for ongoing prenatal care.  She is currently monitored for the following issues for this low-risk pregnancy and has Supervision of low-risk pregnancy and Obesity affecting pregnancy on their problem list.  Patient reports no complaints.  Contractions: Not present. Vag. Bleeding: None.  Movement: Absent. Denies leaking of fluid.   The following portions of the patient's history were reviewed and updated as appropriate: allergies, current medications, past family history, past medical history, past social history, past surgical history and problem list.   Objective:  There were no vitals filed for this visit. Self-Obtained  Fetal Status:     Movement: Absent     Assessment and Plan:  Pregnancy: G4P1021 at [redacted]w[redacted]d 1. Encounter for supervision of low-risk pregnancy in second trimester - prenatal vitamin w/FE, FA (PRENATAL 1 + 1) 27-1 MG TABS tablet; Take 1 tablet by  mouth daily at 12 noon.  Dispense: 90 tablet; Refill: 3 - Blood Pressure Monitoring DEVI; 1 Device by Does not apply route once a week.  Dispense: 1 Device; Refill: 0 - Considering OCPs for MOC - Unsure about MOF and Peds at this time, resources provided  - Requesting information about Horizon results - will send via MyChart today  - Anatomy US scheduled for 07/18/19 - Will have AFP at [place] visit, order entered   2. Obesity affecting pregnancy, antepartum - aspirin EC 81 MG tablet; Take 1 tablet (81 mg total) by mouth daily.  Dispense: 30 tablet; Refill: 6  Preterm labor/second trimester warning symptoms and general obstetric precautions including but not limited to vaginal bleeding, contractions, leaking of fluid and fetal movement were reviewed in detail with the patient.  Return in about 4 weeks (around 07/28/2019) for LOB, Virtual.  Future Appointments  Date Time Provider Department Center  07/18/2019  9:00 AM [facility] NURSE [facility]  07/18/2019  9:00 AM [facility] US1 [facility]     Time spent on virtual visit: 10 minutes  Vonzella Nipple, PA-C

## 2019-06-30 NOTE — Patient Instructions (Signed)

## 2019-06-30 NOTE — Progress Notes (Signed)
I connected with  [name] on 06/30/19 at  8:35 AM EDT by telephone and verified that I am speaking with the correct person using two identifiers.   I discussed the limitations, risks, security and privacy concerns of performing an evaluation and management service by telephone and the availability of in person appointments. I also discussed with the patient that there may be a patient responsible charge related to this service. The patient expressed understanding and agreed to proceed.  Henrietta Dine, CMA 06/30/2019  8:48 AM

## 2019-07-18 ENCOUNTER — Other Ambulatory Visit: Payer: Self-pay

## 2019-07-18 ENCOUNTER — Ambulatory Visit: Payer: Medicaid Other | Admitting: *Deleted

## 2019-07-18 ENCOUNTER — Other Ambulatory Visit: Payer: Self-pay | Admitting: *Deleted

## 2019-07-18 ENCOUNTER — Other Ambulatory Visit: Payer: Medicaid Other

## 2019-07-18 ENCOUNTER — Ambulatory Visit: Payer: Medicaid Other | Attending: Obstetrics and Gynecology

## 2019-07-18 DIAGNOSIS — O99212 Obesity complicating pregnancy, second trimester: Secondary | ICD-10-CM

## 2019-07-18 DIAGNOSIS — Z3490 Encounter for supervision of normal pregnancy, unspecified, unspecified trimester: Secondary | ICD-10-CM | POA: Insufficient documentation

## 2019-07-18 DIAGNOSIS — O99210 Obesity complicating pregnancy, unspecified trimester: Secondary | ICD-10-CM | POA: Diagnosis not present

## 2019-07-18 DIAGNOSIS — Z3492 Encounter for supervision of normal pregnancy, unspecified, second trimester: Secondary | ICD-10-CM

## 2019-07-18 DIAGNOSIS — D563 Thalassemia minor: Secondary | ICD-10-CM | POA: Insufficient documentation

## 2019-07-28 ENCOUNTER — Telehealth (INDEPENDENT_AMBULATORY_CARE_PROVIDER_SITE_OTHER): Payer: Medicaid Other | Admitting: Medical

## 2019-07-28 ENCOUNTER — Encounter: Payer: Self-pay | Admitting: Medical

## 2019-07-28 DIAGNOSIS — D563 Thalassemia minor: Secondary | ICD-10-CM

## 2019-07-28 DIAGNOSIS — O99212 Obesity complicating pregnancy, second trimester: Secondary | ICD-10-CM | POA: Diagnosis not present

## 2019-07-28 DIAGNOSIS — E669 Obesity, unspecified: Secondary | ICD-10-CM

## 2019-07-28 DIAGNOSIS — Z3A20 20 weeks gestation of pregnancy: Secondary | ICD-10-CM | POA: Diagnosis not present

## 2019-07-28 DIAGNOSIS — Z3492 Encounter for supervision of normal pregnancy, unspecified, second trimester: Secondary | ICD-10-CM

## 2019-07-28 NOTE — Patient Instructions (Addendum)
[email or web address] --> your pregnancy, online to do's before your due---> sign up for a childbirth class    Second Trimester of Pregnancy  The second trimester is from week 14 through week 27 (month 4 through 6). This is often the time in pregnancy that you feel your best. Often times, morning sickness has lessened or quit. You may have more energy, and you may get hungry more often. Your unborn baby is growing rapidly. At the end of the sixth month, he or she is about 9 inches long and weighs about 1 pounds. You will likely feel the baby move between 18 and 20 weeks of pregnancy. Follow these instructions at home: Medicines  Take over-the-counter and prescription medicines only as told by your doctor. Some medicines are safe and some medicines are not safe during pregnancy.  Take a prenatal vitamin that contains at least 600 micrograms (mcg) of folic acid.  If you have trouble pooping (constipation), take medicine that will make your stool soft (stool softener) if your doctor approves. Eating and drinking   Eat regular, healthy meals.  Avoid raw meat and uncooked cheese.  If you get low calcium from the food you eat, talk to your doctor about taking a daily calcium supplement.  Avoid foods that are high in fat and sugars, such as fried and sweet foods.  If you feel sick to your stomach (nauseous) or throw up (vomit): ? Eat 4 or 5 small meals a day instead of 3 large meals. ? Try eating a few soda crackers. ? Drink liquids between meals instead of during meals.  To prevent constipation: ? Eat foods that are high in fiber, like fresh fruits and vegetables, whole grains, and beans. ? Drink enough fluids to keep your pee (urine) clear or pale yellow. Activity  Exercise only as told by your doctor. Stop exercising if you start to have cramps.  Do not exercise if it is too hot, too humid, or if you are in a place of great height (high altitude).  Avoid heavy lifting.  Wear  low-heeled shoes. Sit and stand up straight.  You can continue to have sex unless your doctor tells you not to. Relieving pain and discomfort  Wear a good support bra if your breasts are tender.  Take warm water baths (sitz baths) to soothe pain or discomfort caused by hemorrhoids. Use hemorrhoid cream if your doctor approves.  Rest with your legs raised if you have leg cramps or low back pain.  If you develop puffy, bulging veins (varicose veins) in your legs: ? Wear support hose or compression stockings as told by your doctor. ? Raise (elevate) your feet for 15 minutes, 3-4 times a day. ? Limit salt in your food. Prenatal care  Write down your questions. Take them to your prenatal visits.  Keep all your prenatal visits as told by your doctor. This is important. Safety  Wear your seat belt when driving.  Make a list of emergency phone numbers, including numbers for family, friends, the hospital, and police and fire departments. General instructions  Ask your doctor about the right foods to eat or for help finding a counselor, if you need these services.  Ask your doctor about local prenatal classes. Begin classes before month 6 of your pregnancy.  Do not use hot tubs, steam rooms, or saunas.  Do not douche or use tampons or scented sanitary pads.  Do not cross your legs for long periods of time.  Visit your dentist if you  have not done so. Use a soft toothbrush to brush your teeth. Floss gently.  Avoid all smoking, herbs, and alcohol. Avoid drugs that are not approved by your doctor.  Do not use any products that contain nicotine or tobacco, such as cigarettes and e-cigarettes. If you need help quitting, ask your doctor.  Avoid cat litter boxes and soil used by cats. These carry germs that can cause birth defects in the baby and can cause a loss of your baby (miscarriage) or stillbirth. Contact a doctor if:  You have mild cramps or pressure in your lower belly.  You  have pain when you pee (urinate).  You have bad smelling fluid coming from your vagina.  You continue to feel sick to your stomach (nauseous), throw up (vomit), or have watery poop (diarrhea).  You have a nagging pain in your belly area.  You feel dizzy. Get help right away if:  You have a fever.  You are leaking fluid from your vagina.  You have spotting or bleeding from your vagina.  You have severe belly cramping or pain.  You lose or gain weight rapidly.  You have trouble catching your breath and have chest pain.  You notice sudden or extreme puffiness (swelling) of your face, hands, ankles, feet, or legs.  You have not felt the baby move in over an hour.  You have severe headaches that do not go away when you take medicine.  You have trouble seeing. Summary  The second trimester is from week 14 through week 27 (months 4 through 6). This is often the time in pregnancy that you feel your best.  To take care of yourself and your unborn baby, you will need to eat healthy meals, take medicines only if your doctor tells you to do so, and do activities that are safe for you and your baby.  Call your doctor if you get sick or if you notice anything unusual about your pregnancy. Also, call your doctor if you need help with the right food to eat, or if you want to know what activities are safe for you. This information is not intended to replace advice given to you by your health care provider. Make sure you discuss any questions you have with your health care provider. Document Revised: 05/20/2018 Document Reviewed: 03/03/2016 Elsevier Patient Education  2020 [employer].

## 2019-07-28 NOTE — Progress Notes (Signed)
I connected with Jessica Lutz on 07/28/19 at 10:15 AM EDT by: MyChart video and verified that I am speaking with the correct person using two identifiers.  Patient is located at home and provider is located at [facility].     The purpose of this virtual visit is to provide medical care while limiting exposure to the novel coronavirus. I discussed the limitations, risks, security and privacy concerns of performing an evaluation and management service by MyChart video and the availability of in person appointments. I also discussed with the patient that there may be a patient responsible charge related to this service. By engaging in this virtual visit, you consent to the provision of healthcare.  Additionally, you authorize for your insurance to be billed for the services provided during this visit.  The patient expressed understanding and agreed to proceed.  The following staff members participated in the virtual visit:  Marylynn Pearson, RN    PRENATAL VISIT NOTE  Subjective:  Jessica Lutz is a 34 y.o. [id number] at [redacted]w[redacted]d  for phone visit for ongoing prenatal care.  She is currently monitored for the following issues for this low-risk pregnancy and has Supervision of low-risk pregnancy; Obesity affecting pregnancy; and Alpha thalassemia silent carrier on their problem list.  Patient reports no complaints.  Contractions: Not present. Vag. Bleeding: None.  Movement: Present. Denies leaking of fluid.   The following portions of the patient's history were reviewed and updated as appropriate: allergies, current medications, past family history, past medical history, past social history, past surgical history and problem list.   Objective:   Vitals:   07/28/19 1016  BP: 125/77  Pulse: 100   Self-Obtained  Fetal Status:     Movement: Present     Assessment and Plan:  Pregnancy: G4P1021 at [redacted]w[redacted]d 1. Alpha thalassemia silent carrier  2. Encounter for supervision of low-risk pregnancy in second  trimester - Doing well, no complaints - Reviewed last [place], F/U scheduled 7/7 to complete anatomy   3. Obesity affecting pregnancy in second trimester - Taking BASA  Preterm labor symptoms and general obstetric precautions including but not limited to vaginal bleeding, contractions, leaking of fluid and fetal movement were reviewed in detail with the patient.  Return in about 4 weeks (around 08/25/2019) for LOB, Virtual.  Future Appointments  Date Time Provider Department Center  08/16/2019 12:45 PM [facility] NURSE [facility]  08/16/2019 12:45 PM [facility] US5 [facility]     Time spent on virtual visit: 10 minutes  Vonzella Nipple, PA-C

## 2019-08-04 ENCOUNTER — Encounter: Payer: Self-pay | Admitting: Medical

## 2019-08-10 ENCOUNTER — Encounter: Payer: Self-pay | Admitting: Medical

## 2019-08-16 ENCOUNTER — Ambulatory Visit: Payer: Medicaid Other | Attending: Obstetrics and Gynecology

## 2019-08-16 ENCOUNTER — Other Ambulatory Visit: Payer: Self-pay

## 2019-08-16 ENCOUNTER — Ambulatory Visit: Payer: Medicaid Other | Admitting: *Deleted

## 2019-08-16 DIAGNOSIS — O99212 Obesity complicating pregnancy, second trimester: Secondary | ICD-10-CM | POA: Diagnosis not present

## 2019-08-16 DIAGNOSIS — E669 Obesity, unspecified: Secondary | ICD-10-CM

## 2019-08-16 DIAGNOSIS — D563 Thalassemia minor: Secondary | ICD-10-CM | POA: Insufficient documentation

## 2019-08-16 DIAGNOSIS — Z3A23 23 weeks gestation of pregnancy: Secondary | ICD-10-CM

## 2019-08-16 DIAGNOSIS — Z362 Encounter for other antenatal screening follow-up: Secondary | ICD-10-CM

## 2019-08-16 DIAGNOSIS — Z148 Genetic carrier of other disease: Secondary | ICD-10-CM

## 2019-08-28 ENCOUNTER — Telehealth (INDEPENDENT_AMBULATORY_CARE_PROVIDER_SITE_OTHER): Payer: Medicaid Other | Admitting: Nurse Practitioner

## 2019-08-28 ENCOUNTER — Encounter: Payer: Self-pay | Admitting: Nurse Practitioner

## 2019-08-28 VITALS — BP 141/86 | HR 105

## 2019-08-28 DIAGNOSIS — R030 Elevated blood-pressure reading, without diagnosis of hypertension: Secondary | ICD-10-CM

## 2019-08-28 DIAGNOSIS — O99212 Obesity complicating pregnancy, second trimester: Secondary | ICD-10-CM

## 2019-08-28 DIAGNOSIS — E669 Obesity, unspecified: Secondary | ICD-10-CM

## 2019-08-28 DIAGNOSIS — Z3A24 24 weeks gestation of pregnancy: Secondary | ICD-10-CM

## 2019-08-28 DIAGNOSIS — D563 Thalassemia minor: Secondary | ICD-10-CM

## 2019-08-28 DIAGNOSIS — Z3492 Encounter for supervision of normal pregnancy, unspecified, second trimester: Secondary | ICD-10-CM

## 2019-08-28 NOTE — Progress Notes (Signed)
I connected with  [name] on 08/28/19 at  9:15 AM EDT by mychart video and verified that I am speaking with the correct person using two identifiers.   I discussed the limitations, risks, security and privacy concerns of performing an evaluation and management service by telephone and the availability of in person appointments. I also discussed with the patient that there may be a patient responsible charge related to this service. The patient expressed understanding and agreed to proceed.  Marylynn Pearson, RN 08/28/2019  9:41 AM

## 2019-08-28 NOTE — Progress Notes (Signed)
OBSTETRICS PRENATAL VIRTUAL VISIT ENCOUNTER NOTE  Provider location: Center for [facility]   I connected with Jessica Lutz on 08/28/19 at  9:15 AM EDT by MyChart Video Encounter at home and verified that I am speaking with the correct person using two identifiers.   I discussed the limitations, risks, security and privacy concerns of performing an evaluation and management service virtually and the availability of in person appointments. I also discussed with the patient that there may be a patient responsible charge related to this service. The patient expressed understanding and agreed to proceed. Subjective:  Jessica Lutz is a 34 y.o. Z6X0960 at [redacted]w[redacted]d being seen today for ongoing prenatal care.  She is currently monitored for the following issues for this low-risk pregnancy and has Supervision of low-risk pregnancy; Obesity affecting pregnancy; and Alpha thalassemia silent carrier on their problem list.  Patient reports no complaints.  Contractions: Not present. Vag. Bleeding: None.  Movement: Present. Denies any leaking of fluid.   The following portions of the patient's history were reviewed and updated as appropriate: allergies, current medications, past family history, past medical history, past social history, past surgical history and problem list.   Objective:   Vitals:   08/28/19 0941 08/28/19 1003  BP: (!) 152/91 (!) 141/86  Pulse: (!) 105     Fetal Status:     Movement: Present     General:  Alert, oriented and cooperative. Patient is in no acute distress.  Respiratory: Normal respiratory effort, no problems with respiration noted  Mental Status: Normal mood and affect. Normal behavior. Normal judgment and thought content.  Rest of physical exam deferred due to type of encounter  Imaging: [place] MFM OB FOLLOW UP  Result Date: 08/16/2019 ----------------------------------------------------------------------  OBSTETRICS REPORT                        (Signed Final 08/16/2019 01:55 pm) ---------------------------------------------------------------------- Patient Info  ID #:       [id number]                          D.O.B.:  1985/05/30 (33 yrs)  Name:       Jessica Lutz                Visit Date: 08/16/2019 01:03 pm ---------------------------------------------------------------------- Performed By  Attending:        Ma Rings MD         Ref. Address:     [address]                                                             [place], [place]  [id number]  Performed By:     Percell BostonHeather Waken          [facility]  Referred By:      Hermina StaggersMICHAEL L ERVIN                    MD ---------------------------------------------------------------------- Orders  #  Description                           Code        Ordered By  1  US MFM OB FOLLOW UP                   [id number]    Noralee SpaceAVI SHANKAR ----------------------------------------------------------------------  #  Order #                     Accession #                Episode #  1  [id number]                   [phone]                 [id number] ---------------------------------------------------------------------- Indications  Obesity complicating pregnancy, second         O99.212  trimester  [redacted] weeks gestation of pregnancy                Z3A.23  Encounter for other antenatal screening        Z36.2  follow-up  Genetic carrier (silent carrier alpha thal)    Z14.8  Marijuana Use  Low Risk NIPS ---------------------------------------------------------------------- Fetal Evaluation  Num Of Fetuses:         1  Cardiac Activity:       Observed  Presentation:           Cephalic  Placenta:               Anterior  P. Cord Insertion:      Previously Visualized  Amniotic Fluid  AFI FV:      Within normal limits                               Largest Pocket(cm)                              6 ---------------------------------------------------------------------- Biometry  BPD:      57.4  mm     G. Age:  23w 4d         61  %    CI:        80.62   %    70 - 86  FL/HC:      19.9   %    19.2 - 20.8  HC:      201.9  mm     G. Age:  22w 2d         10  %    HC/AC:      1.10        1.05 - 1.21  AC:      183.6  mm     G. Age:  23w 1d         42  %    FL/BPD:     70.0   %    71 - 87  FL:       40.2  mm     G. Age:  23w 0d         33  %    FL/AC:      21.9   %    20 - 24  Est. FW:     557  gm      1 lb 4 oz     37  % ---------------------------------------------------------------------- OB History  Gravidity:    4         Term:   1         SAB:   2  Living:       1 ---------------------------------------------------------------------- Gestational Age  LMP:           23w 1d        Date:  03/07/19                 EDD:   12/12/19  U/S Today:     23w 0d                                        EDD:   12/13/19  Best:          23w 1d     Det. By:  LMP  (03/07/19)          EDD:   12/12/19 ---------------------------------------------------------------------- Anatomy  Cranium:               Appears normal         LVOT:                   Previously seen  Cavum:                 Previously seen        Aortic Arch:            Previously seen  Ventricles:            Appears normal         Ductal Arch:            Appears normal  Choroid Plexus:        Previously seen        Diaphragm:              Appears normal  Cerebellum:            Previously seen        Stomach:                Appears normal, left  sided  Posterior Fossa:       Previously seen        Abdomen:                Previously seen  Nuchal Fold:           Previously seen        Abdominal Wall:         Previously seen  Face:                  Orbits nl; profile     Cord  Vessels:           Previously seen                         prev visualized  Lips:                  Not well visualized    Kidneys:                Appear normal  Palate:                Not well visualized    Bladder:                Appears normal  Thoracic:              Appears normal         Spine:                  Limited views                                                                        previously seen  Heart:                 Appears normal         Upper Extremities:      Previously seen                         (4CH, axis, and                         situs)  RVOT:                  Previously seen        Lower Extremities:      Previously seen  Other:  Female fetus. Heels visualized prev. Hands not well visualized.          Technically difficult due to maternal habitus and fetal position. ---------------------------------------------------------------------- Cervix Uterus Adnexa  Cervix  Length:            3.4  cm.  Normal appearance by transabdominal scan.  Uterus  No abnormality visualized.  Right Ovary  No adnexal mass visualized.  Left Ovary  No adnexal mass visualized.  Cul De Sac  No free fluid seen.  Adnexa  No abnormality visualized. ---------------------------------------------------------------------- Comments  This patient was seen for a follow up exam as the views of  the fetal anatomy were unable to be fully visualized during  her last exam.  She denies any problems since her last  exam.  She was informed that the fetal growth and amniotic fluid  level appears appropriate for her gestational age.  The views of the fetal anatomy were visualized today.  There  were no obvious anomalies noted.  The limitations of ultrasound in the detection of all anomalies  was discussed.  Follow-up as indicated. ----------------------------------------------------------------------                   Ma Rings, MD Electronically Signed Final Report   08/16/2019 01:55 pm  ----------------------------------------------------------------------   Assessment and Plan:  Pregnancy: [id number] at [redacted]w[redacted]d 1. Encounter for supervision of low-risk pregnancy in second trimester Doing well.  Was working from home and very busy on the phone at work.  Advised to bring BP cuff with her and have it checked alongside our cuff.  Advised to have baseline labs done.  Reviewed worsening BP signs of headache, edema, visual changes and RUQ pain - she does not have these today.  2. Alpha thalassemia silent carrier   3. Obesity affecting pregnancy in second trimester   4. Elevated BP without diagnosis of hypertension Two PBs today elevated.  Needs baseline labs for preeclampsia - will get those this week.  Preterm labor symptoms and general obstetric precautions including but not limited to vaginal bleeding, contractions, leaking of fluid and fetal movement were reviewed in detail with the patient. I discussed the assessment and treatment plan with the patient. The patient was provided an opportunity to ask questions and all were answered. The patient agreed with the plan and demonstrated an understanding of the instructions. The patient was advised to call back or seek an in-person office evaluation/go to [facility]U at [facility] for any urgent or concerning symptoms. Please refer to After Visit Summary for other counseling recommendations.   I provided 8 minutes of face-to-face time during this encounter.  Return in about 1 day (around 08/29/2019) for ASAP for nurse visit with BP check and labs - Tues or Wed is OK and ROB in 2 weeks for glucola.  No future appointments. at time of close of chart.  Currie Paris, NP Center for [employer], [facility]

## 2019-08-30 ENCOUNTER — Ambulatory Visit (INDEPENDENT_AMBULATORY_CARE_PROVIDER_SITE_OTHER): Payer: Medicaid Other | Admitting: General Practice

## 2019-08-30 ENCOUNTER — Encounter: Payer: Self-pay | Admitting: Family Medicine

## 2019-08-30 ENCOUNTER — Other Ambulatory Visit: Payer: Self-pay

## 2019-08-30 VITALS — BP 111/68 | HR 82 | Ht 66.0 in | Wt 236.0 lb

## 2019-08-30 DIAGNOSIS — Z0130 Encounter for examination of blood pressure without abnormal findings: Secondary | ICD-10-CM

## 2019-08-30 DIAGNOSIS — R030 Elevated blood-pressure reading, without diagnosis of hypertension: Secondary | ICD-10-CM | POA: Diagnosis not present

## 2019-08-30 NOTE — Progress Notes (Signed)
Patient presents to office today for BP check following up from elevated pressures during 7/19 OB visit. Patient denies headaches, dizziness or blurry vision. She did bring BP cuff from home for comparison. BP in office 111/68 and home cuff reads 122/77. Baseline labs drawn today per Nolene Bernheim. Patient will follow up at next scheduled OB visit on 8/4.  Chase Caller RN BSN 08/31/19

## 2019-08-31 LAB — COMPREHENSIVE METABOLIC PANEL
ALT: 11 IU/L (ref 0–32)
AST: 10 IU/L (ref 0–40)
Albumin/Globulin Ratio: 1.2 (ref 1.2–2.2)
Albumin: 3.5 g/dL — ABNORMAL LOW (ref 3.8–4.8)
Alkaline Phosphatase: 78 IU/L (ref 48–121)
BUN/Creatinine Ratio: 11 (ref 9–23)
BUN: 8 mg/dL (ref 6–20)
Bilirubin Total: <0.2 mg/dL (ref 0.0–1.2)
CO2: 21 mmol/L (ref 20–29)
Calcium: 9.6 mg/dL (ref 8.7–10.2)
Chloride: 105 mmol/L (ref 96–106)
Creatinine, Ser: 0.7 mg/dL (ref 0.57–1.00)
GFR calc Af Amer: 133 mL/min/{1.73_m2} (ref >59)
GFR calc non Af Amer: 115 mL/min/{1.73_m2} (ref >59)
Globulin, Total: 2.9 g/dL (ref 1.5–4.5)
Glucose: 78 mg/dL (ref 65–99)
Potassium: 4.2 mmol/L (ref 3.5–5.2)
Sodium: 136 mmol/L (ref 134–144)
Total Protein: 6.4 g/dL (ref 6.0–8.5)

## 2019-08-31 LAB — PROTEIN / CREATININE RATIO, URINE
Creatinine, Urine: 224.4 mg/dL
Protein, Ur: 39.9 mg/dL
Protein/Creat Ratio: 178 mg/g creat (ref 0–200)

## 2019-08-31 LAB — CBC
Hematocrit: 33.1 % — ABNORMAL LOW (ref 34.0–46.6)
Hemoglobin: 11.1 g/dL (ref 11.1–15.9)
MCH: 27.3 pg (ref 26.6–33.0)
MCHC: 33.5 g/dL (ref 31.5–35.7)
MCV: 82 fL (ref 79–97)
Platelets: 254 10*3/uL (ref 150–450)
RBC: 4.06 x10E6/uL (ref 3.77–5.28)
RDW: 13.1 % (ref 11.7–15.4)
WBC: 9.5 10*3/uL (ref 3.4–10.8)

## 2019-08-31 NOTE — Progress Notes (Signed)
Patient was assessed and managed by nursing staff during this encounter. I have reviewed the chart and agree with the documentation and plan. I have also made any necessary editorial changes.  Deago Burruss, MD 08/31/2019 11:41 AM 

## 2019-09-01 IMAGING — CT CT ABD-PELV W/ CM
2 of 4 series · 16 of 46 positions shown, 18 images · IV contrast (omnipaque)
Comparison: None.

CLINICAL DATA: Abdominal pain and nausea for 5 days. Intermittent
vaginal bleeding.

EXAM:
CT ABDOMEN AND PELVIS WITH CONTRAST
TECHNIQUE: Multidetector CT imaging of the abdomen and pelvis was performed
using the standard protocol following bolus administration of
intravenous contrast.
CONTRAST:  100mL OMNIPAQUE IOHEXOL 300 MG/ML  SOLN

[Series 3: abd/ pelvis 5.0 i30f 2 · axial · 0.98mm/px · z∈[+1130,+1520]mm · 13 of 86 slices shown, 15 images]
[im 4/86  soft-tissue]
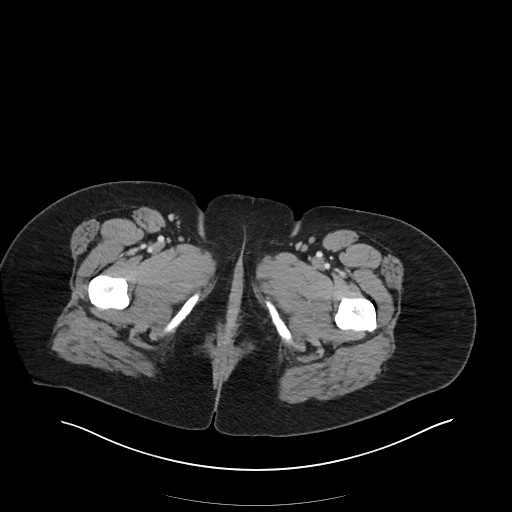
[im 4/86  bone]
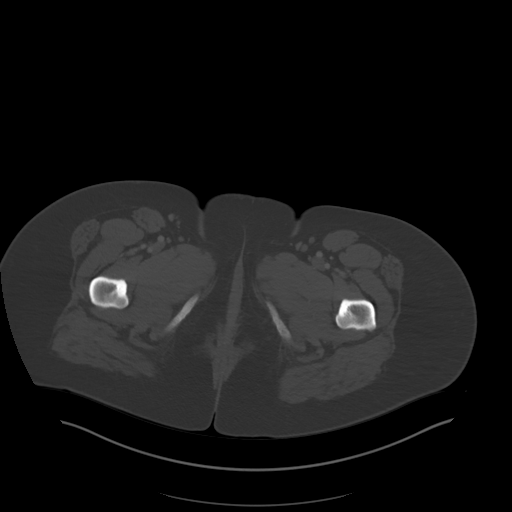
[im 11/86  soft-tissue]
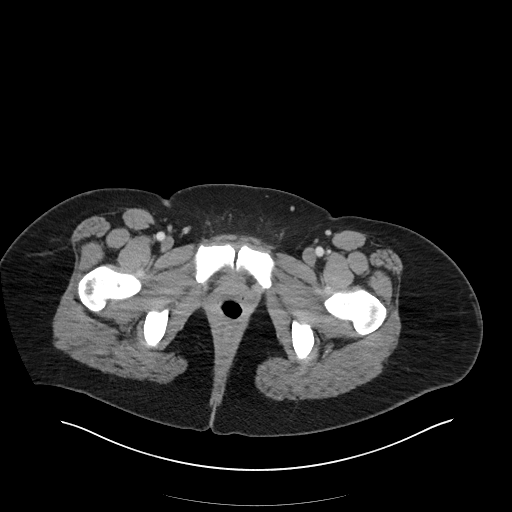
[im 18/86  soft-tissue]
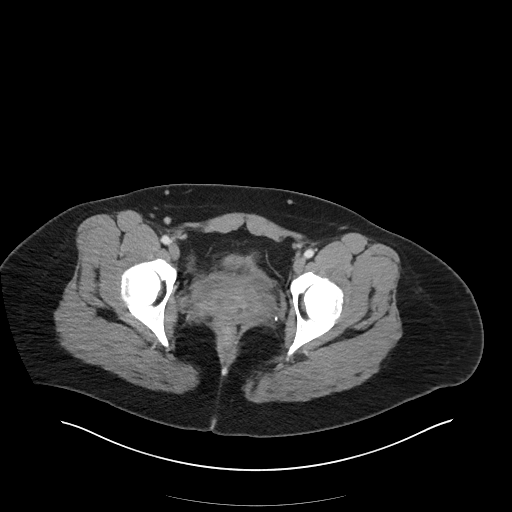
[im 24/86  soft-tissue]
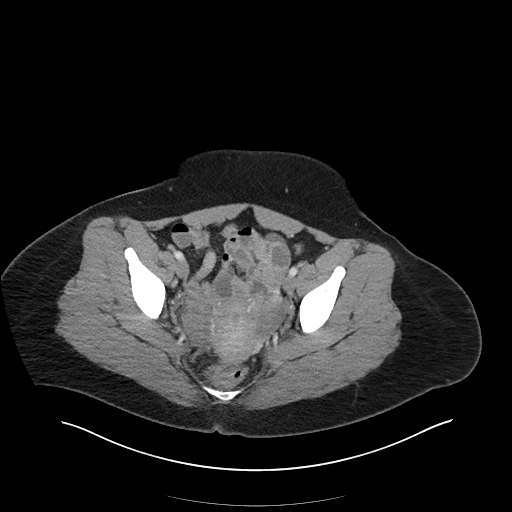
[im 31/86  soft-tissue]
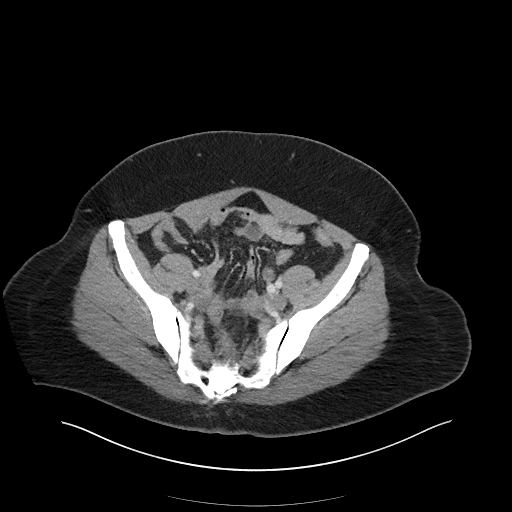
[im 38/86  soft-tissue]
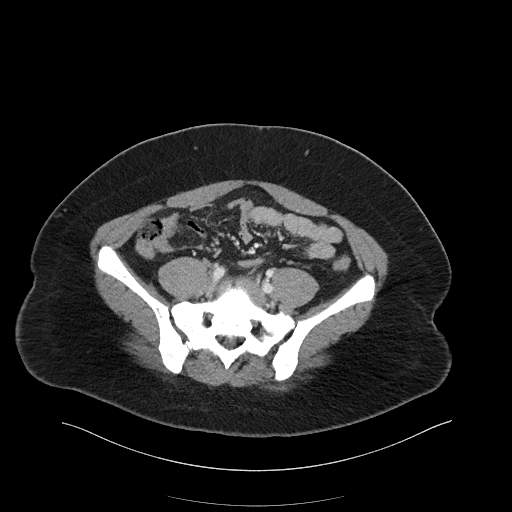
[im 45/86  soft-tissue]
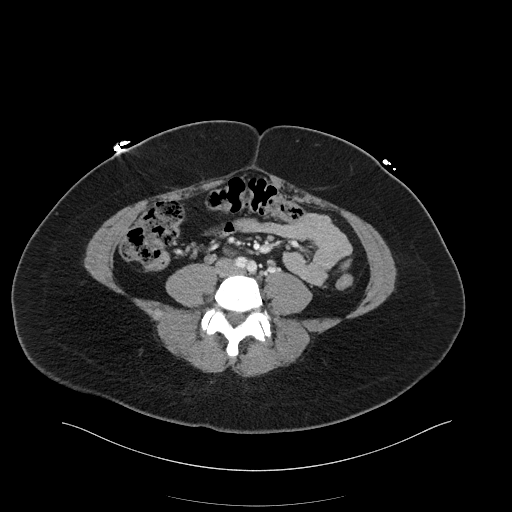
[im 48/86  soft-tissue]
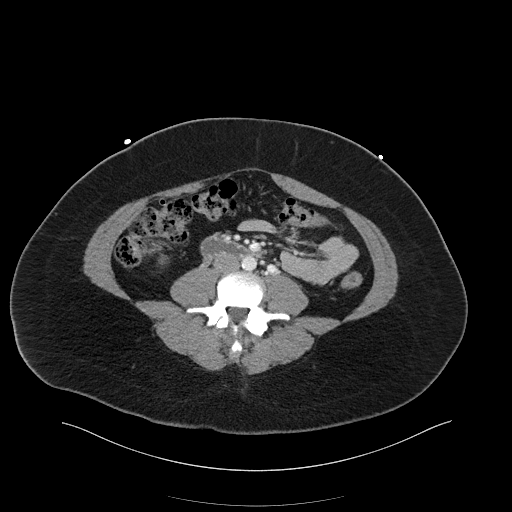
[im 55/86  soft-tissue]
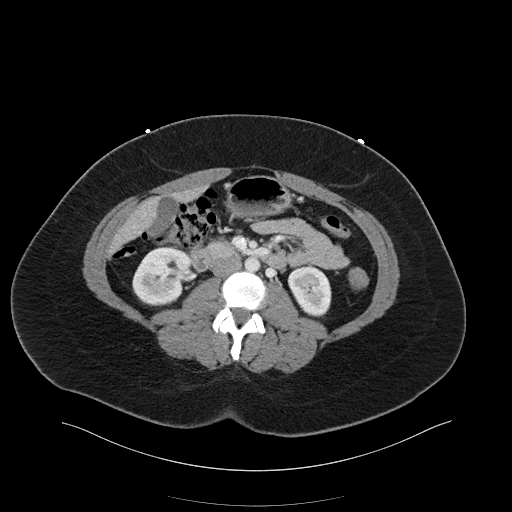
[im 55/86  bone]
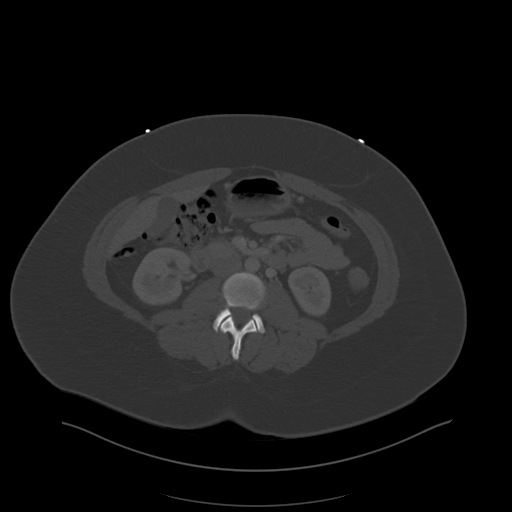
[im 62/86  soft-tissue]
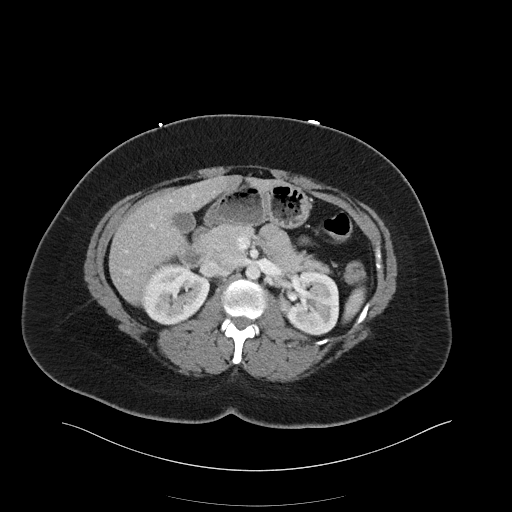
[im 69/86  soft-tissue]
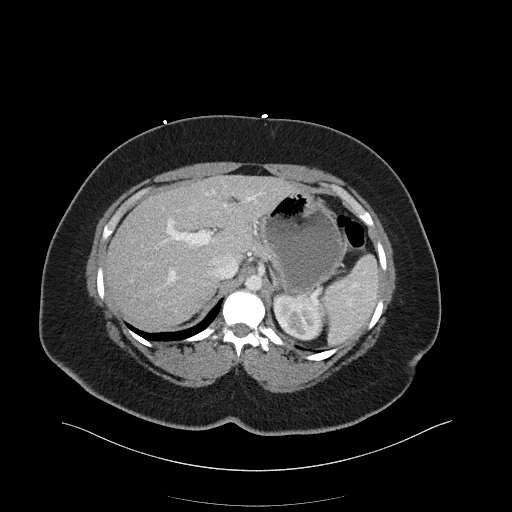
[im 75/86  soft-tissue]
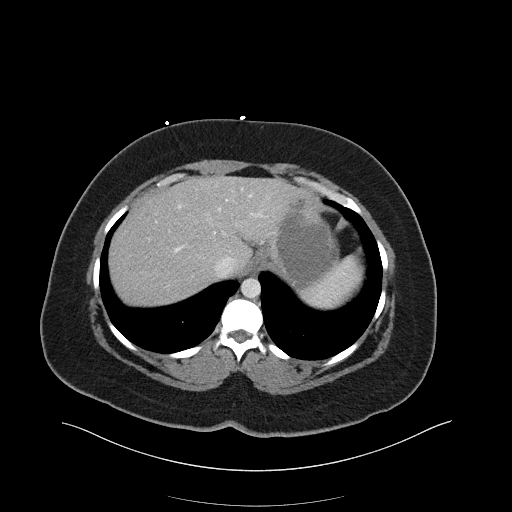
[im 82/86  soft-tissue]
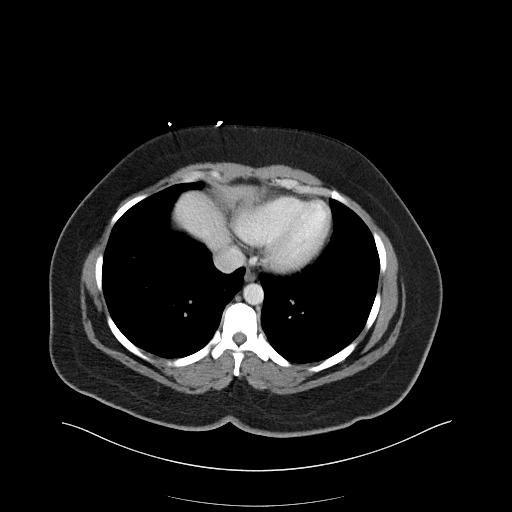

[Series 6: coronal soft tissue · coronal · 0.87mm/px · 3 of 110 slices shown]
[im 37/110  soft-tissue]
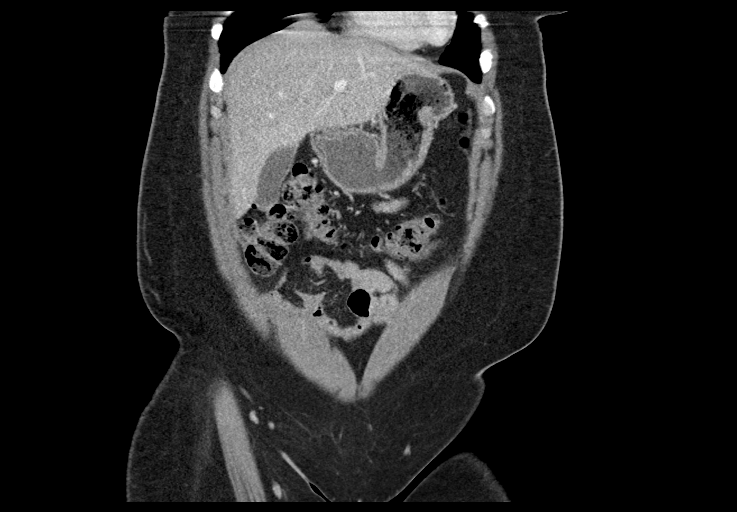
[im 49/110  soft-tissue]
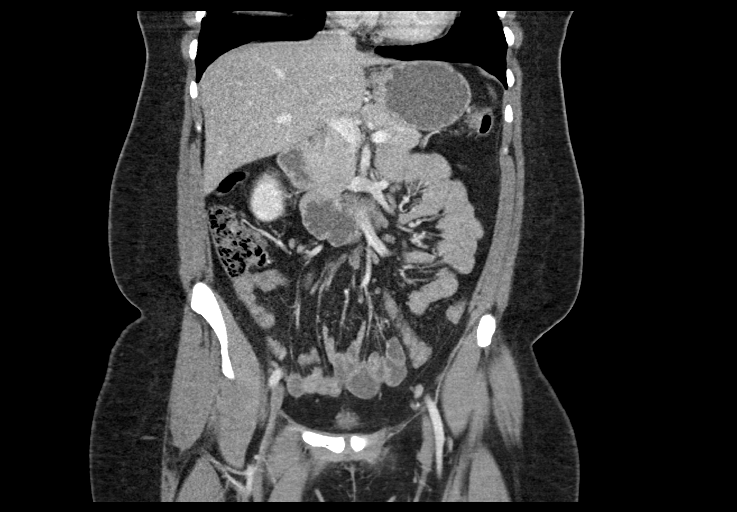
[im 61/110  soft-tissue]
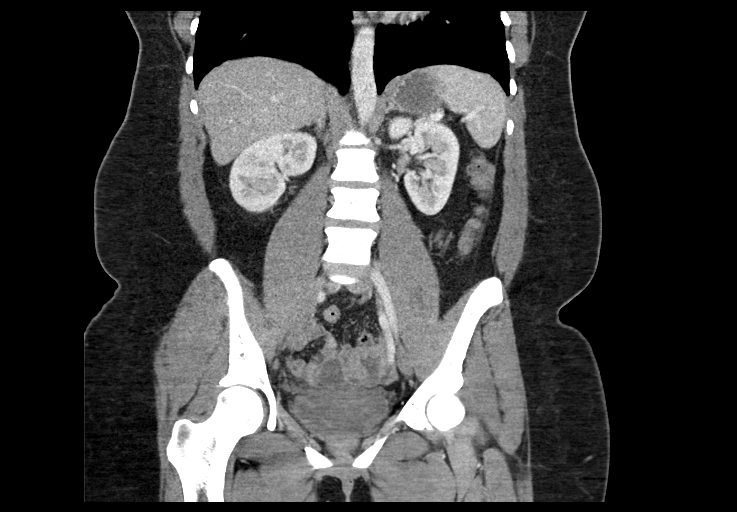

[16 of 46 positions shown; findings below may reference images not displayed]

FINDINGS: Lower chest: Clear lung bases. Normal heart size without pericardial
or pleural effusion.

Hepatobiliary: Minimal motion degradation in the upper abdomen.
Normal liver. Normal gallbladder, without biliary ductal dilatation.

Pancreas: Normal, without mass or ductal dilatation.

Spleen: Normal in size, without focal abnormality.

Adrenals/Urinary Tract: Normal adrenal glands. Normal kidneys,
without hydronephrosis. Decompressed urinary bladder.

Stomach/Bowel: Normal stomach, without wall thickening. Normal colon
and terminal ileum. Appendix positioned anteriorly, including on
image 35/3. Normal small bowel.

Vascular/Lymphatic: Aortic atherosclerosis. No abdominopelvic
adenopathy.

Reproductive: Retroverted uterus. Tampon in place. No adnexal mass.

Other: No significant free fluid.  No free intraperitoneal air.

Musculoskeletal: Transitional lumbosacral anatomy.
IMPRESSION: 1.  No acute process in the abdomen or pelvis.
2. Minimal motion degradation in the upper abdomen.

## 2019-09-13 ENCOUNTER — Other Ambulatory Visit: Payer: Medicaid Other

## 2019-09-13 ENCOUNTER — Other Ambulatory Visit: Payer: Self-pay | Admitting: General Practice

## 2019-09-13 ENCOUNTER — Ambulatory Visit (INDEPENDENT_AMBULATORY_CARE_PROVIDER_SITE_OTHER): Payer: Medicaid Other | Admitting: Nurse Practitioner

## 2019-09-13 ENCOUNTER — Other Ambulatory Visit: Payer: Self-pay

## 2019-09-13 VITALS — BP 112/74 | HR 102 | Wt 237.2 lb

## 2019-09-13 DIAGNOSIS — Z23 Encounter for immunization: Secondary | ICD-10-CM | POA: Diagnosis not present

## 2019-09-13 DIAGNOSIS — Z3493 Encounter for supervision of normal pregnancy, unspecified, third trimester: Secondary | ICD-10-CM

## 2019-09-13 DIAGNOSIS — Z3A27 27 weeks gestation of pregnancy: Secondary | ICD-10-CM

## 2019-09-13 DIAGNOSIS — Z3492 Encounter for supervision of normal pregnancy, unspecified, second trimester: Secondary | ICD-10-CM

## 2019-09-13 NOTE — Progress Notes (Signed)
    Subjective:  Jessica Lutz is a 34 y.o. [id number] at [redacted]w[redacted]d being seen today for ongoing prenatal care.  She is currently monitored for the following issues for this low-risk pregnancy and has Supervision of low-risk pregnancy; Obesity affecting pregnancy; and Alpha thalassemia silent carrier on their problem list.  Patient reports no complaints.  Contractions: Not present. Vag. Bleeding: None.  Movement: Present. Denies leaking of fluid.   The following portions of the patient's history were reviewed and updated as appropriate: allergies, current medications, past family history, past medical history, past social history, past surgical history and problem list. Problem list updated.  Objective:   Vitals:   09/13/19 0907  BP: 112/74  Pulse: (!) 102  Weight: 237 lb 3.2 oz (107.6 kg)    Fetal Status: Fetal Heart Rate (bpm): 163 Fundal Height: 27 cm Movement: Present     General:  Alert, oriented and cooperative. Patient is in no acute distress.  Skin: Skin is warm and dry. No rash noted.   Cardiovascular: Normal heart rate noted  Respiratory: Normal respiratory effort, no problems with respiration noted  Abdomen: Soft, gravid, appropriate for gestational age. Pain/Pressure: Absent     Pelvic:  Cervical exam deferred        Extremities: Normal range of motion.  Edema: Trace  Mental Status: Normal mood and affect. Normal behavior. Normal judgment and thought content.   Urinalysis:      Assessment and Plan:  Pregnancy: G4P1021 at [redacted]w[redacted]d  1. Encounter for supervision of low-risk pregnancy in second trimester Does not yet have Babyscripts app active.  Advised to check for email and work to activate Babyscripts. Advised to check BP weekly Taking low dose aspirin.  2. [redacted] weeks gestation of pregnancy   Preterm labor symptoms and general obstetric precautions including but not limited to vaginal bleeding, contractions, leaking of fluid and fetal movement were reviewed in detail with  the patient. Please refer to After Visit Summary for other counseling recommendations.  Return in about 2 weeks (around 09/27/2019) for virtual ROB.  Nolene Bernheim, RN, MSN, NP-BC Nurse Practitioner, [facility] for [employer], [facility] 09/13/2019 9:26 AM

## 2019-09-14 LAB — CBC
Hematocrit: 33 % — ABNORMAL LOW (ref 34.0–46.6)
Hemoglobin: 11.4 g/dL (ref 11.1–15.9)
MCH: 27.3 pg (ref 26.6–33.0)
MCHC: 34.5 g/dL (ref 31.5–35.7)
MCV: 79 fL (ref 79–97)
Platelets: 266 10*3/uL (ref 150–450)
RBC: 4.17 x10E6/uL (ref 3.77–5.28)
RDW: 12.5 % (ref 11.7–15.4)
WBC: 9.7 10*3/uL (ref 3.4–10.8)

## 2019-09-14 LAB — HIV ANTIBODY (ROUTINE TESTING W REFLEX): HIV Screen 4th Generation wRfx: NONREACTIVE

## 2019-09-14 LAB — RPR: RPR Ser Ql: NONREACTIVE

## 2019-09-14 LAB — GLUCOSE TOLERANCE, 2 HOURS W/ 1HR
Glucose, 1 hour: 166 mg/dL (ref 65–179)
Glucose, 2 hour: 124 mg/dL (ref 65–152)
Glucose, Fasting: 88 mg/dL (ref 65–91)

## 2019-10-02 DIAGNOSIS — O285 Abnormal chromosomal and genetic finding on antenatal screening of mother: Secondary | ICD-10-CM | POA: Insufficient documentation

## 2019-10-02 HISTORY — DX: Abnormal chromosomal and genetic finding on antenatal screening of mother: O28.5

## 2019-10-02 NOTE — Progress Notes (Signed)
I connected with Jessica Lutz 10/03/19 at 10:20 AM EDT by: MyChart video and verified that I am speaking with the correct person using two identifiers.  Patient is located at home and provider is located at [facility].     The purpose of this virtual visit is to provide medical care while limiting exposure to the novel coronavirus. I discussed the limitations, risks, security and privacy concerns of performing an evaluation and management service by MyChart video and the availability of in person appointments. I also discussed with the patient that there may be a patient responsible charge related to this service. By engaging in this virtual visit, you consent to the provision of healthcare.  Additionally, you authorize for your insurance to be billed for the services provided during this visit.  The patient expressed understanding and agreed to proceed.  The following staff members participated in the virtual visit: Vernice Jefferson    PRENATAL VISIT NOTE  Subjective:  Jessica Lutz is a 34 y.o. [id number] at [redacted]w[redacted]d  for phone visit for ongoing prenatal care.  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  Patient reports no complaints.  Contractions: Not present. Vag. Bleeding: None.  Movement: Present. Denies leaking of fluid.   The following portions of the patient's history were reviewed and updated as appropriate: allergies, current medications, past family history, past medical history, past social history, past surgical history and problem list.   Objective:   Vitals:   10/03/19 1013  BP: 130/81  Pulse: (!) 104   Self-Obtained  Fetal Status:     Movement: Present     Assessment and Plan:  Pregnancy: G4P1021 at [redacted]w[redacted]d  1. Encounter for supervision of low-risk pregnancy in third trimester -discussed  breast vs. bottle feeding, pt elects both -discussed contraception, pt unsure, ?OCPs, info given  2. Obesity affecting pregnancy in third trimester -taking low dose ASA -Reviewed warning blood pressure values (systolic = / > 500 and/or diastolic =/> 90). Explained that, if blood pressure is elevated, she should sit down, rest, and eat/drink something. If still elevated 15 minutes later, and she is greater than 20 weeks, she should call clinic or come to MAU. She should come to MAU if she has elevated pressures and any of the following:  - headache not relieved with tylenol, rest, hydration -blurry vision, floating spots in her vision - sudden full-body edema or facial edema -RUQ pain that is constant. These symptoms may indicate that her blood pressure is worsening and she may be developing gestational hypertension or pre-eclampsia, which is an emergency.   3. Alpha thalassemia silent carrier -pt reports she already met with [facility] for counseling and was satisfied with the conversation  4. Tay-Sachs carrier -pt reports she already met with Johnsie Cancel for counseling and was satisfied with the conversation   Preterm labor symptoms and general obstetric precautions including but not limited to vaginal bleeding, contractions, leaking of fluid and fetal movement were reviewed in detail with the patient. I discussed the assessment and treatment plan with the patient. The patient was provided an opportunity to ask questions and all were answered. The patient agreed with the plan and demonstrated an understanding of the instructions. The patient was advised to call back or seek an in-person office evaluation/go to MAU at [facility] for any urgent or concerning symptoms.  Return  in about 2 weeks (around 10/17/2019) for virtual LOB/APP OK; nurse only visit this week to check on fuction of BP cuff.  No future appointments.   Time spent on virtual visit: 14 minutes  Clarisa Fling, NP

## 2019-10-03 ENCOUNTER — Telehealth (INDEPENDENT_AMBULATORY_CARE_PROVIDER_SITE_OTHER): Payer: Medicaid Other | Admitting: Women's Health

## 2019-10-03 VITALS — BP 130/81 | HR 104

## 2019-10-03 DIAGNOSIS — Z3493 Encounter for supervision of normal pregnancy, unspecified, third trimester: Secondary | ICD-10-CM

## 2019-10-03 DIAGNOSIS — D563 Thalassemia minor: Secondary | ICD-10-CM | POA: Diagnosis not present

## 2019-10-03 DIAGNOSIS — O99013 Anemia complicating pregnancy, third trimester: Secondary | ICD-10-CM | POA: Diagnosis not present

## 2019-10-03 DIAGNOSIS — Z3A30 30 weeks gestation of pregnancy: Secondary | ICD-10-CM

## 2019-10-03 DIAGNOSIS — Z148 Genetic carrier of other disease: Secondary | ICD-10-CM | POA: Diagnosis not present

## 2019-10-03 DIAGNOSIS — O99213 Obesity complicating pregnancy, third trimester: Secondary | ICD-10-CM

## 2019-10-03 DIAGNOSIS — E669 Obesity, unspecified: Secondary | ICD-10-CM

## 2019-10-03 NOTE — Progress Notes (Signed)
I connected with  Jessica Lutz on 10/03/19 at 10:20 AM EDT by telephone and verified that I am speaking with the correct person using two identifiers.   I discussed the limitations, risks, security and privacy concerns of performing an evaluation and management service by telephone and the availability of in person appointments. I also discussed with the patient that there may be a patient responsible charge related to this service. The patient expressed understanding and agreed to proceed.  Janene Madeira Brynda Heick, CMA 10/03/2019  10:11 AM

## 2019-10-03 NOTE — Patient Instructions (Addendum)
Maternity Assessment Unit (MAU)  The Maternity Assessment Unit (MAU) is located at the [facility] [place] [facility]. The address is: [address] [place], [place], [place] [address]. Please see map below for additional directions.    The Maternity Assessment Unit is designed to help you during your pregnancy, and for up to 6 weeks after delivery, with any pregnancy- or postpartum-related emergencies, if you think you are in labor, or if your water has broken. For example, if you experience nausea and vomiting, vaginal bleeding, severe abdominal or pelvic pain, elevated blood pressure or other problems related to your pregnancy or postpartum time, please come to the Maternity Assessment Unit for assistance.        Preterm Labor and Birth Information  The normal length of a pregnancy is 39-41 weeks. Preterm labor is when labor starts before 37 completed weeks of pregnancy. What are the risk factors for preterm labor? Preterm labor is more likely to occur in women who:  Have certain infections during pregnancy such as a bladder infection, sexually transmitted infection, or infection inside the uterus (chorioamnionitis).  Have a shorter-than-normal cervix.  Have gone into preterm labor before.  Have had surgery on their cervix.  Are younger than age 64 or older than age 26.  Are African American.  Are pregnant with twins or multiple babies (multiple gestation).  Take street drugs or smoke while pregnant.  Do not gain enough weight while pregnant.  Became pregnant shortly after having been pregnant. What are the symptoms of preterm labor? Symptoms of preterm labor include:  Cramps similar to those that can happen during a menstrual period. The cramps may happen with diarrhea.  Pain in the abdomen or lower back.  Regular uterine contractions that may feel like tightening of the abdomen.  A feeling of increased pressure in the  pelvis.  Increased watery or bloody mucus discharge from the vagina.  Water breaking (ruptured amniotic sac). Why is it important to recognize signs of preterm labor? It is important to recognize signs of preterm labor because babies who are born prematurely may not be fully developed. This can put them at an increased risk for:  Long-term (chronic) heart and lung problems.  Difficulty immediately after birth with regulating body systems, including blood sugar, body temperature, heart rate, and breathing rate.  Bleeding in the brain.  Cerebral palsy.  Learning difficulties.  Death. These risks are highest for babies who are born before 38 weeks of pregnancy. How is preterm labor treated? Treatment depends on the length of your pregnancy, your condition, and the health of your baby. It may involve:  Having a stitch (suture) placed in your cervix to prevent your cervix from opening too early (cerclage).  Taking or being given medicines, such as: ? Hormone medicines. These may be given early in pregnancy to help support the pregnancy. ? Medicine to stop contractions. ? Medicines to help mature the baby's lungs. These may be prescribed if the risk of delivery is high. ? Medicines to prevent your baby from developing cerebral palsy. If the labor happens before 34 weeks of pregnancy, you may need to stay in the hospital. What should I do if I think I am in preterm labor? If you think that you are going into preterm labor, call your health care provider right away. How can I prevent preterm labor in future pregnancies? To increase your chance of having a full-term pregnancy:  Do not use any tobacco products, such as  cigarettes, chewing tobacco, and e-cigarettes. If you need help quitting, ask your health care provider.  Do not use street drugs or medicines that have not been prescribed to you during your pregnancy.  Talk with your health care provider before taking any herbal  supplements, even if you have been taking them regularly.  Make sure you gain a healthy amount of weight during your pregnancy.  Watch for infection. If you think that you might have an infection, get it checked right away.  Make sure to tell your health care provider if you have gone into preterm labor before. This information is not intended to replace advice given to you by your health care provider. Make sure you discuss any questions you have with your health care provider. Document Revised: 05/20/2018 Document Reviewed: 06/19/2015 Elsevier Patient Education  2020 Elsevier Inc.        Preeclampsia and Eclampsia Preeclampsia is a serious condition that may develop during pregnancy. This condition causes high blood pressure and increased protein in your urine along with other symptoms, such as headaches and vision changes. These symptoms may develop as the condition gets worse. Preeclampsia may occur at 20 weeks of pregnancy or later. Diagnosing and treating preeclampsia early is very important. If not treated early, it can cause serious problems for you and your baby. One problem it can lead to is eclampsia. Eclampsia is a condition that causes muscle jerking or shaking (convulsions or seizures) and other serious problems for the mother. During pregnancy, delivering your baby may be the best treatment for preeclampsia or eclampsia. For most women, preeclampsia and eclampsia symptoms go away after giving birth. In rare cases, a woman may develop preeclampsia after giving birth (postpartum preeclampsia). This usually occurs within 48 hours after childbirth but may occur up to 6 weeks after giving birth. What are the causes? The cause of preeclampsia is not known. What increases the risk? The following risk factors make you more likely to develop preeclampsia:  Being pregnant for the first time.  Having had preeclampsia during a past pregnancy.  Having a family history of  preeclampsia.  Having high blood pressure.  Being pregnant with more than one baby.  Being 39 or older.  Being African-American.  Having kidney disease or diabetes.  Having medical conditions such as lupus or blood diseases.  Being very overweight (obese). What are the signs or symptoms? The most common symptoms are:  Severe headaches.  Vision problems, such as blurred or double vision.  Abdominal pain, especially upper abdominal pain. Other symptoms that may develop as the condition gets worse include:  Sudden weight gain.  Sudden swelling of the hands, face, legs, and feet.  Severe nausea and vomiting.  Numbness in the face, arms, legs, and feet.  Dizziness.  Urinating less than usual.  Slurred speech.  Convulsions or seizures. How is this diagnosed? There are no screening tests for preeclampsia. Your health care provider will ask you about symptoms and check for signs of preeclampsia during your prenatal visits. You may also have tests that include:  Checking your blood pressure.  Urine tests to check for protein. Your health care provider will check for this at every prenatal visit.  Blood tests.  Monitoring your baby's heart rate.  Ultrasound. How is this treated? You and your health care provider will determine the treatment approach that is best for you. Treatment may include:  Having more frequent prenatal exams to check for signs of preeclampsia, if you have an increased risk for  preeclampsia.  Medicine to lower your blood pressure.  Staying in the hospital, if your condition is severe. There, treatment will focus on controlling your blood pressure and the amount of fluids in your body (fluid retention).  Taking medicine (magnesium sulfate) to prevent seizures. This may be given as an injection or through an IV.  Taking a low-dose aspirin during your pregnancy.  Delivering your baby early. You may have your labor started with medicine  (induced), or you may have a cesarean delivery. Follow these instructions at home: Eating and drinking   Drink enough fluid to keep your urine pale yellow.  Avoid caffeine. Lifestyle  Do not use any products that contain nicotine or tobacco, such as cigarettes and e-cigarettes. If you need help quitting, ask your health care provider.  Do not use alcohol or drugs.  Avoid stress as much as possible. Rest and get plenty of sleep. General instructions  Take over-the-counter and prescription medicines only as told by your health care provider.  When lying down, lie on your left side. This keeps pressure off your major blood vessels.  When sitting or lying down, raise (elevate) your feet. Try putting some pillows underneath your lower legs.  Exercise regularly. Ask your health care provider what kinds of exercise are best for you.  Keep all follow-up and prenatal visits as told by your health care provider. This is important. How is this prevented? There is no known way of preventing preeclampsia or eclampsia from developing. However, to lower your risk of complications and detect problems early:  Get regular prenatal care. Your health care provider may be able to diagnose and treat the condition early.  Maintain a healthy weight. Ask your health care provider for help managing weight gain during pregnancy.  Work with your health care provider to manage any long-term (chronic) health conditions you have, such as diabetes or kidney problems.  You may have tests of your blood pressure and kidney function after giving birth.  Your health care provider may have you take low-dose aspirin during your next pregnancy. Contact a health care provider if:  You have symptoms that your health care provider told you may require more treatment or monitoring, such as: ? Headaches. ? Nausea or vomiting. ? Abdominal pain. ? Dizziness. ? Light-headedness. Get help right away if:  You have  severe: ? Abdominal pain. ? Headaches that do not get better. ? Dizziness. ? Vision problems. ? Confusion. ? Nausea or vomiting.  You have any of the following: ? A seizure. ? Sudden, rapid weight gain. ? Sudden swelling in your hands, ankles, or face. ? Trouble moving any part of your body. ? Numbness in any part of your body. ? Trouble speaking. ? Abnormal bleeding.  You faint. Summary  Preeclampsia is a serious condition that may develop during pregnancy.  This condition causes high blood pressure and increased protein in your urine along with other symptoms, such as headaches and vision changes.  Diagnosing and treating preeclampsia early is very important. If not treated early, it can cause serious problems for you and your baby.  Get help right away if you have symptoms that your health care provider told you to watch for. This information is not intended to replace advice given to you by your health care provider. Make sure you discuss any questions you have with your health care provider. Document Revised: 09/28/2017 Document Reviewed: 09/02/2015 Elsevier Patient Education  2020 [employer].        Contraception Choices -  [email or web address] Contraception, also called birth control, refers to methods or devices that prevent pregnancy. Hormonal methods Contraceptive implant  A contraceptive implant is a thin, plastic tube that contains a hormone. It is inserted into the upper part of the arm. It can remain in place for up to 3 years. Progestin-only injections Progestin-only injections are injections of progestin, a synthetic form of the hormone progesterone. They are given every 3 months by a health care provider. Birth control pills  Birth control pills are pills that contain hormones that prevent pregnancy. They must be taken once a day, preferably at the same time each day. Birth control patch  The birth control patch contains hormones that prevent  pregnancy. It is placed on the skin and must be changed once a week for three weeks and removed on the fourth week. A prescription is needed to use this method of contraception. Vaginal ring  A vaginal ring contains hormones that prevent pregnancy. It is placed in the vagina for three weeks and removed on the fourth week. After that, the process is repeated with a new ring. A prescription is needed to use this method of contraception. Emergency contraceptive Emergency contraceptives prevent pregnancy after unprotected sex. They come in pill form and can be taken up to 5 days after sex. They work best the sooner they are taken after having sex. Most emergency contraceptives are available without a prescription. This method should not be used as your only form of birth control. Barrier methods Female condom  A female condom is a thin sheath that is worn over the penis during sex. Condoms keep sperm from going inside a woman's body. They can be used with a spermicide to increase their effectiveness. They should be disposed after a single use. Female condom  A female condom is a soft, loose-fitting sheath that is put into the vagina before sex. The condom keeps sperm from going inside a woman's body. They should be disposed after a single use. Diaphragm  A diaphragm is a soft, dome-shaped barrier. It is inserted into the vagina before sex, along with a spermicide. The diaphragm blocks sperm from entering the uterus, and the spermicide kills sperm. A diaphragm should be left in the vagina for 6-8 hours after sex and removed within 24 hours. A diaphragm is prescribed and fitted by a health care provider. A diaphragm should be replaced every 1-2 years, after giving birth, after gaining more than 15 lb (6.8 kg), and after pelvic surgery. Cervical cap  A cervical cap is a round, soft latex or plastic cup that fits over the cervix. It is inserted into the vagina before sex, along with spermicide. It blocks  sperm from entering the uterus. The cap should be left in place for 6-8 hours after sex and removed within 48 hours. A cervical cap must be prescribed and fitted by a health care provider. It should be replaced every 2 years. Sponge  A sponge is a soft, circular piece of polyurethane foam with spermicide on it. The sponge helps block sperm from entering the uterus, and the spermicide kills sperm. To use it, you make it wet and then insert it into the vagina. It should be inserted before sex, left in for at least 6 hours after sex, and removed and thrown away within 30 hours. Spermicides Spermicides are chemicals that kill or block sperm from entering the cervix and uterus. They can come as a cream, jelly, suppository, foam, or tablet. A spermicide should be inserted into the  vagina with an applicator at least 10-15 minutes before sex to allow time for it to work. The process must be repeated every time you have sex. Spermicides do not require a prescription. Intrauterine contraception Intrauterine device (IUD) An IUD is a T-shaped device that is put in a woman's uterus. There are two types:  Hormone IUD.This type contains progestin, a synthetic form of the hormone progesterone. This type can stay in place for 3-5 years.  Copper IUD.This type is wrapped in copper wire. It can stay in place for 10 years.  Permanent methods of contraception Female tubal ligation In this method, a woman's fallopian tubes are sealed, tied, or blocked during surgery to prevent eggs from traveling to the uterus. Hysteroscopic sterilization In this method, a small, flexible insert is placed into each fallopian tube. The inserts cause scar tissue to form in the fallopian tubes and block them, so sperm cannot reach an egg. The procedure takes about 3 months to be effective. Another form of birth control must be used during those 3 months. Female sterilization This is a procedure to tie off the tubes that carry sperm  (vasectomy). After the procedure, the man can still ejaculate fluid (semen). Natural planning methods Natural family planning In this method, a couple does not have sex on days when the woman could become pregnant. Calendar method This means keeping track of the length of each menstrual cycle, identifying the days when pregnancy can happen, and not having sex on those days. Ovulation method In this method, a couple avoids sex during ovulation. Symptothermal method This method involves not having sex during ovulation. The woman typically checks for ovulation by watching changes in her temperature and in the consistency of cervical mucus. Post-ovulation method In this method, a couple waits to have sex until after ovulation. Summary  Contraception, also called birth control, means methods or devices that prevent pregnancy.  Hormonal methods of contraception include implants, injections, pills, patches, vaginal rings, and emergency contraceptives.  Barrier methods of contraception can include female condoms, female condoms, diaphragms, cervical caps, sponges, and spermicides.  There are two types of IUDs (intrauterine devices). An IUD can be put in a woman's uterus to prevent pregnancy for 3-5 years.  Permanent sterilization can be done through a procedure for males, females, or both.  Natural family planning methods involve not having sex on days when the woman could become pregnant. This information is not intended to replace advice given to you by your health care provider. Make sure you discuss any questions you have with your health care provider. Document Revised: 01/28/2017 Document Reviewed: 02/29/2016 Elsevier Patient Education  2020 [employer].

## 2019-10-19 ENCOUNTER — Other Ambulatory Visit: Payer: Self-pay

## 2019-10-19 ENCOUNTER — Ambulatory Visit (INDEPENDENT_AMBULATORY_CARE_PROVIDER_SITE_OTHER): Payer: Medicaid Other | Admitting: *Deleted

## 2019-10-19 VITALS — BP 131/87 | HR 112

## 2019-10-19 DIAGNOSIS — Z0130 Encounter for examination of blood pressure without abnormal findings: Secondary | ICD-10-CM

## 2019-10-19 NOTE — Progress Notes (Addendum)
Here to get bp cuff checked to see if working correctly.  There was a difference between her cuff reading and ours.  Discussed with Nolene Bernheim and advised to recheck with pt cuff in lower arm. If lower , similar to office reading then have pt do at home in lower arm.  If higher then must do all bp at office, not at home and have in office visits , not virtual.  Rechecked bp and was higher, instructed to not check at home and do all in person visits. She voices understanding. Benuel Ly,RN   Chart reviewed for nurse visit. Agree with plan of care.   Currie Paris, NP 10/19/2019 9:59 AM

## 2019-10-31 ENCOUNTER — Other Ambulatory Visit: Payer: Self-pay

## 2019-10-31 ENCOUNTER — Ambulatory Visit (INDEPENDENT_AMBULATORY_CARE_PROVIDER_SITE_OTHER): Payer: Medicaid Other | Admitting: Women's Health

## 2019-10-31 VITALS — BP 126/82 | HR 103 | Wt 243.0 lb

## 2019-10-31 DIAGNOSIS — O99213 Obesity complicating pregnancy, third trimester: Secondary | ICD-10-CM

## 2019-10-31 DIAGNOSIS — Z3493 Encounter for supervision of normal pregnancy, unspecified, third trimester: Secondary | ICD-10-CM

## 2019-10-31 DIAGNOSIS — D563 Thalassemia minor: Secondary | ICD-10-CM

## 2019-10-31 DIAGNOSIS — Z148 Genetic carrier of other disease: Secondary | ICD-10-CM

## 2019-10-31 NOTE — Patient Instructions (Addendum)
Maternity Assessment Unit (MAU)  The Maternity Assessment Unit (MAU) is located at the [facility]. The address is: [address], [address], [place], [place] [address]. Please see map below for additional directions.    The Maternity Assessment Unit is designed to help you during your pregnancy, and for up to 6 weeks after delivery, with any pregnancy- or postpartum-related emergencies, if you think you are in labor, or if your water has broken. For example, if you experience nausea and vomiting, vaginal bleeding, severe abdominal or pelvic pain, elevated blood pressure or other problems related to your pregnancy or postpartum time, please come to the Maternity Assessment Unit for assistance.        Preterm Labor and Birth Information  The normal length of a pregnancy is 39-41 weeks. Preterm labor is when labor starts before 37 completed weeks of pregnancy. What are the risk factors for preterm labor? Preterm labor is more likely to occur in women who:  Have certain infections during pregnancy such as a bladder infection, sexually transmitted infection, or infection inside the uterus (chorioamnionitis).  Have a shorter-than-normal cervix.  Have gone into preterm labor before.  Have had surgery on their cervix.  Are younger than age 17 or older than age 35.  Are African American.  Are pregnant with twins or multiple babies (multiple gestation).  Take street drugs or smoke while pregnant.  Do not gain enough weight while pregnant.  Became pregnant shortly after having been pregnant. What are the symptoms of preterm labor? Symptoms of preterm labor include:  Cramps similar to those that can happen during a menstrual period. The cramps may happen with diarrhea.  Pain in the abdomen or lower back.  Regular uterine contractions that may feel like tightening of the abdomen.  A feeling of increased pressure in the  pelvis.  Increased watery or bloody mucus discharge from the vagina.  Water breaking (ruptured amniotic sac). Why is it important to recognize signs of preterm labor? It is important to recognize signs of preterm labor because babies who are born prematurely may not be fully developed. This can put them at an increased risk for:  Long-term (chronic) heart and lung problems.  Difficulty immediately after birth with regulating body systems, including blood sugar, body temperature, heart rate, and breathing rate.  Bleeding in the brain.  Cerebral palsy.  Learning difficulties.  Death. These risks are highest for babies who are born before 34 weeks of pregnancy. How is preterm labor treated? Treatment depends on the length of your pregnancy, your condition, and the health of your baby. It may involve:  Having a stitch (suture) placed in your cervix to prevent your cervix from opening too early (cerclage).  Taking or being given medicines, such as: ? Hormone medicines. These may be given early in pregnancy to help support the pregnancy. ? Medicine to stop contractions. ? Medicines to help mature the baby's lungs. These may be prescribed if the risk of delivery is high. ? Medicines to prevent your baby from developing cerebral palsy. If the labor happens before 34 weeks of pregnancy, you may need to stay in the hospital. What should I do if I think I am in preterm labor? If you think that you are going into preterm labor, call your health care provider right away. How can I prevent preterm labor in future pregnancies? To increase your chance of having a full-term pregnancy:  Do not use any tobacco products, such as   cigarettes, chewing tobacco, and e-cigarettes. If you need help quitting, ask your health care provider.  Do not use street drugs or medicines that have not been prescribed to you during your pregnancy.  Talk with your health care provider before taking any herbal  supplements, even if you have been taking them regularly.  Make sure you gain a healthy amount of weight during your pregnancy.  Watch for infection. If you think that you might have an infection, get it checked right away.  Make sure to tell your health care provider if you have gone into preterm labor before. This information is not intended to replace advice given to you by your health care provider. Make sure you discuss any questions you have with your health care provider. Document Revised: 05/20/2018 Document Reviewed: 06/19/2015 Elsevier Patient Education  2020 Elsevier Inc.         Group B Streptococcus Test During Pregnancy Why am I having this test? Routine testing, also called screening, for group B streptococcus (GBS) is recommended for all pregnant women between the 36th and 37th week of pregnancy. GBS is a type of bacteria that can be passed from mother to baby during childbirth. Screening will help guide whether or not you will need treatment during labor and delivery to prevent complications such as:  An infection in your uterus during labor.  An infection in your uterus after delivery.  A serious infection in your baby after delivery, such as pneumonia, meningitis, or sepsis. GBS screening is not often done before 36 weeks of pregnancy unless you go into labor prematurely. What happens if I have group B streptococcus? If testing shows that you have GBS, your health care provider will recommend treatment with IV antibiotics during labor and delivery. This treatment significantly decreases the risk of complications for you and your baby. If you have a planned C-section and you have GBS, you may not need to be treated with antibiotics because GBS is usually passed to babies after labor starts and your water breaks. If you are in labor or your water breaks before your C-section, it is possible for GBS to get into your uterus and be passed to your baby, so you might need  treatment. Is there a chance I may not need to be tested? You may not need to be tested for GBS if:  You have a urine test that shows GBS before 36 to 37 weeks.  You had a baby with GBS infection after a previous delivery. In these cases, you will automatically be treated for GBS during labor and delivery. What is being tested? This test is done to check if you have group B streptococcus in your vagina or rectum. What kind of sample is taken? To collect samples for this test, your health care provider will swab your vagina and rectum with a cotton swab. The sample is then sent to the lab to see if GBS is present. What happens during the test?   You will remove your clothing from the waist down.  You will lie down on an exam table in the same position as you would for a pelvic exam.  Your health care provider will swab your vagina and rectum to collect samples for a culture test.  You will be able to go home after the test and do all your usual activities. How are the results reported? The test results are reported as positive or negative. What do the results mean?  A positive test means you are   at risk for passing GBS to your baby during labor and delivery. Your health care provider will recommend that you are treated with an IV antibiotic during labor and delivery.  A negative test means you are at very low risk of passing GBS to your baby. There is still a low risk of passing GBS to your baby because sometimes test results may report that you do not have a condition when you do (false-negative result) or there is a chance that you may become infected with GBS after the test is done. You most likely will not need to be treated with an antibiotic during labor and delivery. Talk with your health care provider about what your results mean. Questions to ask your health care provider Ask your health care provider, or the department that is doing the test:  When will my results be  ready?  How will I get my results?  What are my treatment options? Summary  Routine testing (screening) for group B streptococcus (GBS) is recommended for all pregnant women between the 36th and 37th week of pregnancy.  GBS is a type of bacteria that can be passed from mother to baby during childbirth.  If testing shows that you have GBS, your health care provider will recommend that you are treated with IV antibiotics during labor and delivery. This treatment almost always prevents infection in newborns. This information is not intended to replace advice given to you by your health care provider. Make sure you discuss any questions you have with your health care provider. Document Revised: 05/19/2018 Document Reviewed: 02/23/2018 Elsevier Patient Education  2020 Elsevier Inc.  

## 2019-10-31 NOTE — Progress Notes (Addendum)
Subjective:  Jessica Lutz is a 34 y.o. [id number] at [redacted]w[redacted]d being seen today for ongoing prenatal care.  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  Patient reports no complaints.  Contractions: Not present. Vag. Bleeding: None.  Movement: Present. Denies leaking of fluid.   The following portions of the patient's history were reviewed and updated as appropriate: allergies, current medications, past family history, past medical history, past social history, past surgical history and problem list. Problem list updated.  Objective:   Vitals:   10/31/19 1525  BP: 126/82  Pulse: (!) 103  Weight: 243 lb (110.2 kg)    Fetal Status: Fetal Heart Rate (bpm): 154 Fundal Height: 35 cm Movement: Present     General:  Alert, oriented and cooperative. Patient is in no acute distress.  Skin: Skin is warm and dry. No rash noted.   Cardiovascular: Normal heart rate noted  Respiratory: Normal respiratory effort, no problems with respiration noted  Abdomen: Soft, gravid, appropriate for gestational age. Pain/Pressure: Present     Pelvic: Vag. Bleeding: None     Cervical exam deferred        Extremities: Normal range of motion.  Edema: Trace  Mental Status: Normal mood and affect. Normal behavior. Normal judgment and thought content.   Urinalysis:      Assessment and Plan:  Pregnancy: G4P1021 at [redacted]w[redacted]d  1. Encounter for supervision of low-risk pregnancy in third trimester -anticipatory guidance given on upcoming visits -no concerns  2. 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 Alpha thalassemia silent carrier -GC on 07/20/2019  4. Obesity affecting pregnancy in third trimester -on low dose ASA  Preterm labor symptoms and general obstetric precautions including but not limited to  vaginal bleeding, contractions, leaking of fluid and fetal movement were reviewed in detail with the patient. I discussed the assessment and treatment plan with the patient. The patient was provided an opportunity to ask questions and all were answered. The patient agreed with the plan and demonstrated an understanding of the instructions. The patient was advised to call back or seek an in-person office evaluation/go to MAU at [facility] for any urgent or concerning symptoms. Please refer to After Visit Summary for other counseling recommendations.  Return in about 2 weeks (around 11/14/2019) for in-person/LOB/APP OK/GBS/cultures.   Zabella Wease, Odie Sera, NP

## 2019-11-08 ENCOUNTER — Encounter (HOSPITAL_COMMUNITY): Payer: Self-pay | Admitting: Family Medicine

## 2019-11-08 ENCOUNTER — Other Ambulatory Visit: Payer: Self-pay

## 2019-11-08 ENCOUNTER — Observation Stay (HOSPITAL_COMMUNITY)
Admission: AD | Admit: 2019-11-08 | Discharge: 2019-11-09 | Disposition: A | Discharge status: Home / Self Care | Payer: Medicaid Other | Attending: Family Medicine | Admitting: Family Medicine

## 2019-11-08 DIAGNOSIS — D560 Alpha thalassemia: Secondary | ICD-10-CM | POA: Insufficient documentation

## 2019-11-08 DIAGNOSIS — Z3493 Encounter for supervision of normal pregnancy, unspecified, third trimester: Secondary | ICD-10-CM

## 2019-11-08 DIAGNOSIS — O285 Abnormal chromosomal and genetic finding on antenatal screening of mother: Secondary | ICD-10-CM

## 2019-11-08 DIAGNOSIS — Z3A35 35 weeks gestation of pregnancy: Secondary | ICD-10-CM | POA: Insufficient documentation

## 2019-11-08 DIAGNOSIS — B951 Streptococcus, group B, as the cause of diseases classified elsewhere: Secondary | ICD-10-CM

## 2019-11-08 DIAGNOSIS — O99213 Obesity complicating pregnancy, third trimester: Secondary | ICD-10-CM

## 2019-11-08 DIAGNOSIS — Z20822 Contact with and (suspected) exposure to covid-19: Secondary | ICD-10-CM | POA: Diagnosis not present

## 2019-11-08 DIAGNOSIS — D563 Thalassemia minor: Secondary | ICD-10-CM | POA: Diagnosis present

## 2019-11-08 DIAGNOSIS — Z8751 Personal history of pre-term labor: Secondary | ICD-10-CM | POA: Diagnosis present

## 2019-11-08 DIAGNOSIS — O99013 Anemia complicating pregnancy, third trimester: Secondary | ICD-10-CM | POA: Diagnosis not present

## 2019-11-08 DIAGNOSIS — Z148 Genetic carrier of other disease: Secondary | ICD-10-CM

## 2019-11-08 LAB — RESPIRATORY PANEL BY RT PCR (FLU A&B, COVID)
Influenza A by PCR: NEGATIVE
Influenza B by PCR: NEGATIVE
SARS Coronavirus 2 by RT PCR: NEGATIVE

## 2019-11-08 LAB — CBC
HCT: 36.3 % (ref 36.0–46.0)
Hemoglobin: 11.7 g/dL — ABNORMAL LOW (ref 12.0–15.0)
MCH: 26.8 pg (ref 26.0–34.0)
MCHC: 32.2 g/dL (ref 30.0–36.0)
MCV: 83.3 fL (ref 80.0–100.0)
Platelets: 237 10*3/uL (ref 150–400)
RBC: 4.36 MIL/uL (ref 3.87–5.11)
RDW: 14.1 % (ref 11.5–15.5)
WBC: 9.2 10*3/uL (ref 4.0–10.5)
nRBC: 0 % (ref 0.0–0.2)

## 2019-11-08 LAB — URINALYSIS, ROUTINE W REFLEX MICROSCOPIC
Bilirubin Urine: NEGATIVE
Glucose, UA: NEGATIVE mg/dL
Ketones, ur: NEGATIVE mg/dL
Nitrite: NEGATIVE
Protein, ur: NEGATIVE mg/dL
Specific Gravity, Urine: 1.006 (ref 1.005–1.030)
pH: 7 (ref 5.0–8.0)

## 2019-11-08 LAB — RPR: RPR Ser Ql: NONREACTIVE

## 2019-11-08 LAB — GROUP B STREP BY PCR: Group B strep by PCR: POSITIVE — AB

## 2019-11-08 MED ORDER — PHENYLEPHRINE 40 MCG/ML (10ML) SYRINGE FOR IV PUSH (FOR BLOOD PRESSURE SUPPORT): 80.0000 ug | PREFILLED_SYRINGE | INTRAVENOUS | Status: DC | PRN

## 2019-11-08 MED ORDER — OXYCODONE-ACETAMINOPHEN 5-325 MG PO TABS: 2.0000 | ORAL_TABLET | ORAL | Status: DC | PRN

## 2019-11-08 MED ORDER — LACTATED RINGERS IV SOLN: 500.0000 mL | Freq: Once | INTRAVENOUS | Status: DC

## 2019-11-08 MED ORDER — SOD CITRATE-CITRIC ACID 500-334 MG/5ML PO SOLN
30.0000 mL | ORAL | Status: DC | PRN
  Administered 2019-11-08: 30 mL via ORAL
  Filled 2019-11-08: qty 15

## 2019-11-08 MED ORDER — DIPHENHYDRAMINE HCL 50 MG/ML IJ SOLN: 12.5000 mg | INTRAMUSCULAR | Status: DC | PRN

## 2019-11-08 MED ORDER — SODIUM CHLORIDE 0.9 % IV SOLN
2.0000 g | Freq: Four times a day (QID) | INTRAVENOUS | Status: DC
  Administered 2019-11-08 – 2019-11-09 (×4): 2 g via INTRAVENOUS
  Filled 2019-11-08 (×4): qty 2000

## 2019-11-08 MED ORDER — OXYTOCIN-SODIUM CHLORIDE 30-0.9 UT/500ML-% IV SOLN: 2.5000 [IU]/h | INTRAVENOUS | Status: DC

## 2019-11-08 MED ORDER — BETAMETHASONE SOD PHOS & ACET 6 (3-3) MG/ML IJ SUSP
12.0000 mg | Freq: Once | INTRAMUSCULAR | Status: AC
  Administered 2019-11-08: 12 mg via INTRAMUSCULAR
  Filled 2019-11-08: qty 5

## 2019-11-08 MED ORDER — ONDANSETRON HCL 4 MG/2ML IJ SOLN: 4.0000 mg | Freq: Four times a day (QID) | INTRAMUSCULAR | Status: DC | PRN

## 2019-11-08 MED ORDER — FENTANYL-BUPIVACAINE-NACL 0.5-0.125-0.9 MG/250ML-% EP SOLN: 12.0000 mL/h | EPIDURAL | Status: DC | PRN

## 2019-11-08 MED ORDER — EPHEDRINE 5 MG/ML INJ: 10.0000 mg | INTRAVENOUS | Status: DC | PRN

## 2019-11-08 MED ORDER — FENTANYL CITRATE (PF) 100 MCG/2ML IJ SOLN: 100.0000 ug | INTRAMUSCULAR | Status: DC | PRN

## 2019-11-08 MED ORDER — ACETAMINOPHEN 325 MG PO TABS: 650.0000 mg | ORAL_TABLET | ORAL | Status: DC | PRN

## 2019-11-08 MED ORDER — LACTATED RINGERS IV SOLN: INTRAVENOUS | Status: DC

## 2019-11-08 MED ORDER — OXYTOCIN BOLUS FROM INFUSION: 333.0000 mL | Freq: Once | INTRAVENOUS | Status: DC

## 2019-11-08 MED ORDER — LACTATED RINGERS IV SOLN: 500.0000 mL | INTRAVENOUS | Status: DC | PRN

## 2019-11-08 MED ORDER — OXYCODONE-ACETAMINOPHEN 5-325 MG PO TABS: 1.0000 | ORAL_TABLET | ORAL | Status: DC | PRN

## 2019-11-08 MED ORDER — LIDOCAINE HCL (PF) 1 % IJ SOLN: 30.0000 mL | INTRAMUSCULAR | Status: DC | PRN

## 2019-11-08 MED ORDER — OXYTOCIN 10 UNIT/ML IJ SOLN: 10.0000 [IU] | Freq: Once | INTRAMUSCULAR | Status: DC | PRN

## 2019-11-08 NOTE — MAU Note (Signed)
PT SAYS UC STRONG  SINCE 0320.[facility] WITH CLINIC. Marland Kitchen

## 2019-11-08 NOTE — Progress Notes (Signed)
Patient ID: [name], female   DOB: 1985/05/05, 34 y.o.   MRN: [id number] Labor Progress Note Jessica Lutz is a 34 y.o. [id number] at [redacted]w[redacted]d presented for lat preterm labor.  S: Doing well. Feeling occasional contractions. Doe not feel that contractions have changed since this morning.   O:  BP 131/82    Pulse (!) 106    Temp 98.6 F (37 C) (Oral)    Resp 16    Ht 5\' 6"  (1.676 m)    Wt 110.6 kg    LMP 03/07/2019 (Exact Date)    BMI 39.35 kg/m   FHT: baseline 145, moderate variability, accelerations present, no decelerations present UC: every 8-10 min   CVE: Dilation: 5 Effacement (%): 70 Cervical Position: Posterior Station: -2 Presentation: Vertex Exam by:: Dr [email or web address]   A&P: 34 y.o. 34 [redacted]w[redacted]d late pre term labor.   #Labor: Same progression. Contractions have been at similar interval since last check. Uncomfortable with contractions. Will continue to monitor with expectant management.   #Pain: planning epidural   #FWB: cat 1 strip    [redacted]w[redacted]d, Medical Student 6:12 PM

## 2019-11-08 NOTE — H&P (Signed)
OBSTETRIC ADMISSION HISTORY AND PHYSICAL  Jessica Lutz is a 34 y.o. female [phone] with IUP at [redacted]w[redacted]d by LMP presenting for preterm labor. She reports +FMs, No LOF, no VB, no blurry vision, headaches or peripheral edema, and RUQ pain.  She plans on breast and formula feeding. She is undecided for birth control, considering pills. She received her prenatal care at [facility]   Dating: By LMP --->  Estimated Date of Delivery: 12/12/19  Sono:    @[redacted]w[redacted]d , CWD, normal anatomy, cephalic presentation, 557g, 37% EFW   Prenatal History/Complications:  --silent carrier for alpha-thal --Pseudodeficiency variant for Tay-Sachs  --Obesity in pregnancy (current BMI 39) on ASA    Past Medical History: Past Medical History:  Diagnosis Date  . Anemia   . Anemia 03/22/2018  . No pertinent past medical history     Past Surgical History: Past Surgical History:  Procedure Laterality Date  . NO PAST SURGERIES      Obstetrical History: OB History    Gravida  4   Para  1   Term  1   Preterm      AB  2   Living  1     SAB  2   TAB      Ectopic      Multiple      Live Births  1           Social History Social History   Socioeconomic History  . Marital status: Single    Spouse name: Not on file  . Number of children: Not on file  . Years of education: Not on file  . Highest education level: Not on file  Occupational History  . Not on file  Tobacco Use  . Smoking status: Never Smoker  . Smokeless tobacco: Never Used  Vaping Use  . Vaping Use: Never used  Substance and Sexual Activity  . Alcohol use: Not Currently    Comment: occ  . Drug use: Not Currently    Types: Marijuana    Comment: last used 3 wks ago from 07/18/19  . Sexual activity: Yes    Birth control/protection: None  Other Topics Concern  . Not on file  Social History Narrative  . Not on file   Social Determinants of Health   Financial Resource Strain:   . Difficulty of Paying Living Expenses: Not on  file  Food Insecurity: No Food Insecurity  . Worried About 09/17/19 in the Last Year: Never true  . Ran Out of Food in the Last Year: Never true  Transportation Needs: No Transportation Needs  . Lack of Transportation (Medical): No  . Lack of Transportation (Non-Medical): No  Physical Activity:   . Days of Exercise per Week: Not on file  . Minutes of Exercise per Session: Not on file  Stress:   . Feeling of Stress : Not on file  Social Connections:   . Frequency of Communication with Friends and Family: Not on file  . Frequency of Social Gatherings with Friends and Family: Not on file  . Attends Religious Services: Not on file  . Active Member of Clubs or Organizations: Not on file  . Attends Programme researcher, broadcasting/film/video Meetings: Not on file  . Marital Status: Not on file    Family History: Family History  Problem Relation Age of Onset  . Thyroid disease Mother   . Alcohol abuse Neg Hx     Allergies: No Known Allergies  Medications Prior to Admission  Medication Sig Dispense Refill Last Dose  . aspirin EC 81 MG tablet Take 1 tablet (81 mg total) by mouth daily. 30 tablet 6 Past Month at Unknown time  . prenatal vitamin w/FE, FA (PRENATAL 1 + 1) 27-1 MG TABS tablet Take 1 tablet by mouth daily at 12 noon. 90 tablet 3 Past Week at Unknown time  . amoxicillin (AMOXIL) 500 MG tablet Take 500 mg by mouth 3 (three) times daily. (Patient not taking: Reported on 10/19/2019)   NEVER TOOK  . Blood Pressure Monitoring DEVI 1 Device by Does not apply route once a week. 1 Device 0      Review of Systems   All systems reviewed and negative except as stated in HPI  Blood pressure 129/84, pulse (!) 108, temperature 98.8 F (37.1 C), temperature source Oral, resp. rate 18, height 5\' 6"  (1.676 m), weight 110.6 kg, last menstrual period 03/07/2019. General appearance: alert, cooperative and no distress Lungs: No increased WOB  Heart: regular rate and rhythm Abdomen: soft, non-tender;  bowel sounds normal Extremities: Homans sign is negative, no sign of DVT Presentation: cephalic Fetal monitoringBaseline: 140 bpm, Variability: Good {> 6 bpm), Accelerations: Reactive and Decelerations: Absent Uterine activity every 6-7 minutes  Dilation: 4 Effacement (%): 70 Station: -2 Exam by:: dawson cnm   Prenatal labs: ABO, Rh: --/--/A POS (09/29 02-05-1996) Antibody: NEG (09/29 0810) Rubella: 2.19 (04/22 1217) RPR: Non Reactive (08/04 0920)  HBsAg: Negative (04/22 1217)  HIV: Non Reactive (08/04 0920)  GBS:   Pending  2 hr Glucola normal  Genetic screening low risk NIPS  Anatomy 05-01-2004  Normal female   Prenatal Transfer Tool  Maternal Diabetes: No Genetic Screening: Normal Maternal Ultrasounds/Referrals: Normal Fetal Ultrasounds or other Referrals:  None Maternal Substance Abuse:  No Significant Maternal Medications:  None Significant Maternal Lab Results: None  Results for orders placed or performed during the hospital encounter of 11/08/19 (from the past 24 hour(s))  Urinalysis, Routine w reflex microscopic Urine, Clean Catch   Collection Time: 11/08/19  6:14 AM  Result Value Ref Range   Color, Urine YELLOW YELLOW   APPearance CLEAR CLEAR   Specific Gravity, Urine 1.006 1.005 - 1.030   pH 7.0 5.0 - 8.0   Glucose, UA NEGATIVE NEGATIVE mg/dL   Hgb urine dipstick SMALL (A) NEGATIVE   Bilirubin Urine NEGATIVE NEGATIVE   Ketones, ur NEGATIVE NEGATIVE mg/dL   Protein, ur NEGATIVE NEGATIVE mg/dL   Nitrite NEGATIVE NEGATIVE   Leukocytes,Ua LARGE (A) NEGATIVE   RBC / HPF 0-5 0 - 5 RBC/hpf   WBC, UA 6-10 0 - 5 WBC/hpf   Bacteria, UA MANY (A) NONE SEEN   Squamous Epithelial / LPF 6-10 0 - 5  Type and screen [facility]   Collection Time: 11/08/19  8:10 AM  Result Value Ref Range   ABO/RH(D) A POS    Antibody Screen NEG    Sample Expiration      11/11/2019,2359 Performed at [facility], [address], [place], [place]   Respiratory  Panel by RT PCR (Flu A&B, Covid) - Nasopharyngeal Swab   Collection Time: 11/08/19  8:21 AM   Specimen: Nasopharyngeal Swab  Result Value Ref Range   SARS Coronavirus 2 by RT PCR NEGATIVE NEGATIVE   Influenza A by PCR NEGATIVE NEGATIVE   Influenza B by PCR NEGATIVE NEGATIVE  CBC   Collection Time: 11/08/19  8:21 AM  Result Value Ref Range   WBC 9.2 4.0 -  10.5 K/uL   RBC 4.36 3.87 - 5.11 MIL/uL   Hemoglobin 11.7 (L) 12.0 - 15.0 g/dL   HCT 24.4 36 - 46 %   MCV 83.3 80.0 - 100.0 fL   MCH 26.8 26.0 - 34.0 pg   MCHC 32.2 30.0 - 36.0 g/dL   RDW 01.0 27.2 - 53.6 %   Platelets 237 150 - 400 K/uL   nRBC 0.0 0.0 - 0.2 %    Patient Active Problem List   Diagnosis Date Noted  . Preterm labor in third trimester 11/08/2019  . 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 Alpha thalassemia silent carrier 06/30/2019  . Supervision of low-risk pregnancy 05/29/2019  . Obesity affecting pregnancy 05/29/2019    Assessment/Plan:  Jessica Lutz is a 34 y.o. [id number] at [redacted]w[redacted]d here for preterm labor. Pregnancy complicated by alpha thalassemia carrier, pseudodeficiency variant of Tay-Sachs, and elevated BMI on ASA.   # Late Pre-term Labor: Made cervical change while in the MAU and appears uncomfortable with more frequent contractions. Expectant management. BMZ at 0831 today, if still in labor in 24 hours will give second dose.  #Pain: Plans for epidural  #FWB:  Cat 1 strip  #ID: GBS unknown, Amp with PCR pending  #MOF: Formula and Breast #MOC: Undecided, will continue discussing options  #Circ:N/A  Allayne Stack, DO  11/08/2019, 10:20 AM

## 2019-11-08 NOTE — MAU Provider Note (Signed)
History     CSN: [id number]  Arrival date and time: 11/08/19 6160   First Provider Initiated Contact with Patient 11/08/19 0554      Chief Complaint  Patient presents with  . Abdominal Pain   Ms. Jessica Lutz is a 34 y.o. year old G2P1021 female at [redacted]w[redacted]d weeks gestation who presents to MAU reporting strong contractions since 0320 this morning. She reports not being able to get comfortable anyway she positions herself. She has only tried position changes to help relieve the pain. She denies any VB or LOF. She reports good (+) FM today. She receives [facility] at [facility].   OB History    Gravida  4   Para  1   Term  1   Preterm      AB  2   Living  1     SAB  2   TAB      Ectopic      Multiple      Live Births  1           Past Medical History:  Diagnosis Date  . Anemia   . Anemia 03/22/2018  . No pertinent past medical history     Past Surgical History:  Procedure Laterality Date  . NO PAST SURGERIES      Family History  Problem Relation Age of Onset  . Thyroid disease Mother   . Alcohol abuse Neg Hx     Social History   Tobacco Use  . Smoking status: Never Smoker  . Smokeless tobacco: Never Used  Vaping Use  . Vaping Use: Never used  Substance Use Topics  . Alcohol use: Not Currently    Comment: occ  . Drug use: Not Currently    Types: Marijuana    Comment: last used 3 wks ago from 07/18/19    Allergies: No Known Allergies  Medications Prior to Admission  Medication Sig Dispense Refill Last Dose  . aspirin EC 81 MG tablet Take 1 tablet (81 mg total) by mouth daily. 30 tablet 6 Past Month at Unknown time  . prenatal vitamin w/FE, FA (PRENATAL 1 + 1) 27-1 MG TABS tablet Take 1 tablet by mouth daily at 12 noon. 90 tablet 3 Past Week at Unknown time  . amoxicillin (AMOXIL) 500 MG tablet Take 500 mg by mouth 3 (three) times daily. (Patient not taking: Reported on 10/19/2019)   NEVER TOOK  . Blood Pressure Monitoring DEVI 1 Device by Does not  apply route once a week. 1 Device 0     Review of Systems  Constitutional: Negative.   HENT: Negative.   Eyes: Negative.   Respiratory: Negative.   Cardiovascular: Negative.   Gastrointestinal: Negative.   Endocrine: Negative.   Genitourinary: Positive for pelvic pain (and pressure).  Musculoskeletal: Negative.   Skin: Negative.   Allergic/Immunologic: Negative.   Neurological: Negative.   Hematological: Negative.   Psychiatric/Behavioral: Negative.    Physical Exam   Blood pressure 113/71, pulse (!) 108, temperature 98 F (36.7 C), temperature source Oral, resp. rate 20, height 5\' 6"  (1.676 m), weight 110.6 kg, last menstrual period 03/07/2019.  Physical Exam Vitals and nursing note reviewed. Exam conducted with a chaperone present.  Constitutional:      Appearance: She is well-developed. She is obese.  HENT:     Head: Normocephalic and atraumatic.  Eyes:     Extraocular Movements: Extraocular movements intact.  Cardiovascular:     Rate and Rhythm: Tachycardia present.  Genitourinary:  Cervix: Dilated.     Comments: Dilation: 3 Effacement (%): 70 Cervical Position: Posterior Station: -3 Presentation: Vertex Exam by: Carloyn Jaeger, [name] Skin:    General: Skin is warm and dry.  Neurological:     Mental Status: She is alert and oriented to person, place, and time.  Psychiatric:        Mood and Affect: Mood normal.        Behavior: Behavior normal.   Dilation: 4 Effacement (%): 70 Cervical Position: Middle Station: -2 Presentation: Vertex Exam by:: Kinga Cassar cnm   REACTIVE NST - FHR: 145 bpm / moderate variability / accels present / decels absent / TOCO: regular every ? 3-5 mins (not tracing well)  MAU Course  Procedures  MDM CCUA Cervical Exam  Results for orders placed or performed during the hospital encounter of 11/08/19 (from the past 24 hour(s))  Urinalysis, Routine w reflex microscopic Urine, Clean Catch     Status: Abnormal   Collection Time:  11/08/19  6:14 AM  Result Value Ref Range   Color, Urine YELLOW YELLOW   APPearance CLEAR CLEAR   Specific Gravity, Urine 1.006 1.005 - 1.030   pH 7.0 5.0 - 8.0   Glucose, UA NEGATIVE NEGATIVE mg/dL   Hgb urine dipstick SMALL (A) NEGATIVE   Bilirubin Urine NEGATIVE NEGATIVE   Ketones, ur NEGATIVE NEGATIVE mg/dL   Protein, ur NEGATIVE NEGATIVE mg/dL   Nitrite NEGATIVE NEGATIVE   Leukocytes,Ua LARGE (A) NEGATIVE   RBC / HPF 0-5 0 - 5 RBC/hpf   WBC, UA 6-10 0 - 5 WBC/hpf   Bacteria, UA MANY (A) NONE SEEN   Squamous Epithelial / LPF 6-10 0 - 5   *Consult with Dr. Adrian Blackwater @ 0700 & 0720 - notified of patient's complaints, assessments & NST results, recommended tx plan admit to L&D, BMZ, GBS, Ampicillin 2 gm every 6 hrs   Assessment and Plan  Preterm labor in third trimester without delivery - Admit to L&D  - Routine orders - Ampicillin 2 gm every 6 hours - Rapid GBS swab - BMZ 12 mg IM injection - Start LR @ 125 ml/hr - Labor team notified of admission and orders  Labor team will assume care of patient at the time of admission    Raelyn Mora, MSN, [name] 11/08/2019, 5:54 AM

## 2019-11-08 NOTE — Progress Notes (Signed)
LABOR PROGRESS NOTE  Jessica Lutz is a 34 y.o. [id number] at [redacted]w[redacted]d admitted for late pre-term labor.   Subjective: Doing well, breathing through contractions well.   Objective: BP 127/76   Pulse 99   Temp 97.8 F (36.6 C) (Oral)   Resp 16   Ht 5\' 6"  (1.676 m)   Wt 110.6 kg   LMP 03/07/2019 (Exact Date)   BMI 39.35 kg/m  or  Vitals:   11/08/19 1107 11/08/19 1239 11/08/19 1326 11/08/19 1415  BP: 127/76     Pulse: 99     Resp: 18 16 16 16   Temp: 97.8 F (36.6 C)     TempSrc: Oral     Weight:      Height:        Dilation: 5 Effacement (%): 70 Cervical Position: Posterior Station: -2 Presentation: Vertex Exam by:: Dr FHT: baseline rate 135, moderate varibility, +acel, -decel Toco: every 10   Labs: Lab Results  Component Value Date   WBC 9.2 11/08/2019   HGB 11.7 (L) 11/08/2019   HCT 36.3 11/08/2019   MCV 83.3 11/08/2019   PLT 237 11/08/2019    Patient Active Problem List   Diagnosis Date Noted  . Preterm labor in third trimester 11/08/2019  . 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 Alpha thalassemia silent carrier 06/30/2019  . Supervision of low-risk pregnancy 05/29/2019  . Obesity affecting pregnancy 05/29/2019    Assessment / Plan: 34 y.o. G4P1021 at [redacted]w[redacted]d here for late pre-term labor.   Labor: Some progression since last check and uncomfortable with contractions, however appear to be spacing a little. Will continue to monitor with expectant management.  Fetal Wellbeing:  Cat 1 strip  Pain Control:  Desires epidural when active labor  Anticipated MOD:  SVD  34, DO Family Medicine PGY-3 11/08/2019, 2:33 PM

## 2019-11-09 DIAGNOSIS — B951 Streptococcus, group B, as the cause of diseases classified elsewhere: Secondary | ICD-10-CM

## 2019-11-09 HISTORY — DX: Streptococcus, group b, as the cause of diseases classified elsewhere: B95.1

## 2019-11-09 LAB — TYPE AND SCREEN
ABO/RH(D): A POS
Antibody Screen: NEGATIVE

## 2019-11-09 MED ORDER — BETAMETHASONE SOD PHOS & ACET 6 (3-3) MG/ML IJ SUSP
12.0000 mg | Freq: Once | INTRAMUSCULAR | Status: AC
  Administered 2019-11-09: 12 mg via INTRAMUSCULAR

## 2019-11-09 MED ORDER — ACETAMINOPHEN 325 MG PO TABS: 650.0000 mg | ORAL_TABLET | Freq: Four times a day (QID) | ORAL | Status: DC | PRN

## 2019-11-09 NOTE — Discharge Summary (Signed)
Physician Discharge Summary  Patient ID: Jessica Lutz MRN: [id number] DOB/AGE: [birth year] 34 y.o.  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 On arrival to MAU she was noted to have regular contractions on toco with cervical change, and was admitted to L&D for further observation. Her first dose of betamethasone was administered on 9/29 and pt was started on ampicillin given GBS unknown in preterm labor. GBS PCR was also collected on admission and noted to be positive prior to discharge. Pt received second dose of betamethasone on 11/09/19 prior to discharge. Given stable with decreased contractions and reassuring fetal monitoring (Category 1 strip, Reactive), pt was discharged s/p 24 hours without additional cervical change (last exam unchanged at 5/50/high on 9/30 at 0700 prior to discharge). Plan for follow-up in clinic on 11/15/19 as previously scheduled. Provided strict return precautions for preterm labor, vaginal bleeding and decreased fetal movement.   Consults: None  Significant Diagnostic Studies:  COVID screen negative GBS PCR positive RPR non reactive Blood type A+ UA: notable for large LE, many bacteria (Urine Culture pending on discharge)  Treatments: BMZ x2, ampicillin, IVF  Discharge Exam: Blood pressure 137/81, pulse (!) 111, temperature 98.7 F (37.1 C), temperature source Axillary, resp. rate 16, height 5\' 6"  (1.676 m), weight 110.6 kg, last menstrual period 03/07/2019. General appearance: alert, cooperative and appears stated age Head: Normocephalic, without obvious abnormality,  atraumatic Eyes: conjunctivae/corneas clear. EOM intact. Resp: normal WOB Cardio: regular rate and rhythm GI: gravid, nontender Skin: Skin color, texture, turgor normal. No rashes or lesions  Disposition:  Discharge disposition: 01-Home or Self Care       Discharge Instructions    Call MD for:   Complete by: As directed    Vaginal bleeding, decreased fetal movement, loss of fluid, and contractions every 5 minutes or more for several hours   Call MD for:  difficulty breathing, headache or visual disturbances   Complete by: As directed    Call MD for:  extreme fatigue   Complete by: As directed    Call MD for:  persistant dizziness or light-headedness   Complete by: As directed    Call MD for:  severe uncontrolled pain   Complete by: As directed    Call MD for:  temperature >100.4   Complete by: As directed    Diet general   Complete by: As directed    Increase activity slowly   Complete by: As directed      Allergies as of 11/09/2019   No Known Allergies     Medication List    STOP taking these medications   amoxicillin 500 MG tablet Commonly known as: AMOXIL     TAKE these medications   acetaminophen 325 MG tablet Commonly known as: TYLENOL Take 2 tablets (650 mg total) by mouth every 6 (six) hours as needed for mild pain or moderate pain (mild discomfortORtemperature>/= 100.4 F).   aspirin EC 81 MG tablet Take 1 tablet (81 mg total) by mouth daily.   Blood Pressure Monitoring Devi 1 Device by Does not apply route once a week.   prenatal vitamin w/FE, FA 27-1 MG Tabs tablet  Take 1 tablet by mouth daily at 12 noon.        Signed: Sheila Oats 11/09/2019, 8:52 AM

## 2019-11-09 NOTE — Discharge Instructions (Signed)
Signs and Symptoms of Labor Labor is your body's natural process of moving your baby, placenta, and umbilical cord out of your uterus. The process of labor usually starts when your baby is full-term, between 66 and 40 weeks of pregnancy. How will I know when I am close to going into labor? As your body prepares for labor and the birth of your baby, you may notice the following symptoms in the weeks and days before true labor starts:  Having a strong desire to get your home ready to receive your new baby. This is called nesting. Nesting may be a sign that labor is approaching, and it may occur several weeks before birth. Nesting may involve cleaning and organizing your home.  Passing a small amount of thick, bloody mucus out of your vagina (normal bloody show or losing your mucus plug). This may happen more than a week before labor begins, or it might occur right before labor begins as the opening of the cervix starts to widen (dilate). For some women, the entire mucus plug passes at once. For others, smaller portions of the mucus plug may gradually pass over several days.  Your baby moving (dropping) lower in your pelvis to get into position for birth (lightening). When this happens, you may feel more pressure on your bladder and pelvic bone and less pressure on your ribs. This may make it easier to breathe. It may also cause you to need to urinate more often and have problems with bowel movements.  Having "practice contractions" (Braxton Hicks contractions) that occur at irregular (unevenly spaced) intervals that are more than 10 minutes apart. This is also called false labor. False labor contractions are common after exercise or sexual activity, and they will stop if you change position, rest, or drink fluids. These contractions are usually mild and do not get stronger over time. They may feel like: ? A backache or back pain. ? Mild cramps, similar to menstrual cramps. ? Tightening or pressure in  your abdomen. Other early symptoms that labor may be starting soon include:  Nausea or loss of appetite.  Diarrhea.  Having a sudden burst of energy, or feeling very tired.  Mood changes.  Having trouble sleeping. How will I know when labor has begun? Signs that true labor has begun may include:  Having contractions that come at regular (evenly spaced) intervals and increase in intensity. This may feel like more intense tightening or pressure in your abdomen that moves to your back. ? Contractions may also feel like rhythmic pain in your upper thighs or back that comes and goes at regular intervals. ? For first-time mothers, this change in intensity of contractions often occurs at a more gradual pace. ? Women who have given birth before may notice a more rapid progression of contraction changes.  Having a feeling of pressure in the vaginal area.  Your water breaking (rupture of membranes). This is when the sac of fluid that surrounds your baby breaks. When this happens, you will notice fluid leaking from your vagina. This may be clear or blood-tinged. Labor usually starts within 24 hours of your water breaking, but it may take longer to begin. ? Some women notice this as a gush of fluid. ? Others notice that their underwear repeatedly becomes damp. Follow these instructions at home:   When labor starts, or if your water breaks, call your health care provider or nurse care line. Based on your situation, they will determine when you should go in for  an exam.  When you are in early labor, you may be able to rest and manage symptoms at home. Some strategies to try at home include: ? Breathing and relaxation techniques. ? Taking a warm bath or shower. ? Listening to music. ? Using a heating pad on the lower back for pain. If you are directed to use heat:  Place a towel between your skin and the heat source.  Leave the heat on for 20-30 minutes.  Remove the heat if your skin turns  bright red. This is especially important if you are unable to feel pain, heat, or cold. You may have a greater risk of getting burned. Get help right away if:  You have painful, regular contractions that are 5 minutes apart or less.  Labor starts before you are [redacted] weeks along in your pregnancy.  You have a fever.  You have a headache that does not go away.  You have bright red blood coming from your vagina.  You do not feel your baby moving.  You have a sudden onset of: ? Severe headache with vision problems. ? Nausea, vomiting, or diarrhea. ? Chest pain or shortness of breath. These symptoms may be an emergency. If your health care provider recommends that you go to the hospital or birth center where you plan to deliver, do not drive yourself. Have someone else drive you, or call emergency services (911 in the [place] Summary  Labor is your body's natural process of moving your baby, placenta, and umbilical cord out of your uterus.  The process of labor usually starts when your baby is full-term, between 92 and 40 weeks of pregnancy.  When labor starts, or if your water breaks, call your health care provider or nurse care line. Based on your situation, they will determine when you should go in for an exam. This information is not intended to replace advice given to you by your health care provider. Make sure you discuss any questions you have with your health care provider. Document Revised: 10/26/2016 Document Reviewed: 07/03/2016 Elsevier Patient Education  2020 [employer] Fetal Movement Counts  What is a fetal movement count?  A fetal movement count is the number of times that you feel your baby move during a certain amount of time. This may also be called a fetal kick count. A fetal movement count is recommended for every pregnant woman. You may be asked to start counting fetal movements as early as week 28 of your pregnancy. Pay attention to when your baby is most active.  You may notice your baby's sleep and wake cycles. You may also notice things that make your baby move more. You should do a fetal movement count:  When your baby is normally most active.  At the same time each day. A good time to count movements is while you are resting, after having something to eat and drink. How do I count fetal movements? 1. Find a quiet, comfortable area. Sit, or lie down on your side. 2. Write down the date, the start time and stop time, and the number of movements that you felt between those two times. Take this information with you to your health care visits. 3. Write down your start time when you feel the first movement. 4. Count kicks, flutters, swishes, rolls, and jabs. You should feel at least 10 movements. 5. You may stop counting after you have felt 10 movements, or if you have been counting for 2 hours. Write down the stop  time. 6. If you do not feel 10 movements in 2 hours, contact your health care provider for further instructions. Your health care provider may want to do additional tests to assess your baby's well-being. Contact a health care provider if:  You feel fewer than 10 movements in 2 hours.  Your baby is not moving like he or she usually does. Date: ____________ Start time: ____________ Stop time: ____________ Movements: ____________ Date: ____________ Start time: ____________ Stop time: ____________ Movements: ____________ Date: ____________ Start time: ____________ Stop time: ____________ Movements: ____________ Date: ____________ Start time: ____________ Stop time: ____________ Movements: ____________ Date: ____________ Start time: ____________ Stop time: ____________ Movements: ____________ Date: ____________ Start time: ____________ Stop time: ____________ Movements: ____________ Date: ____________ Start time: ____________ Stop time: ____________ Movements: ____________ Date: ____________ Start time: ____________ Stop time: ____________  Movements: ____________ Date: ____________ Start time: ____________ Stop time: ____________ Movements: ____________ This information is not intended to replace advice given to you by your health care provider. Make sure you discuss any questions you have with your health care provider. Document Revised: 09/15/2018 Document Reviewed: 09/15/2018 Elsevier Patient Education  2020 [employer].

## 2019-11-10 ENCOUNTER — Telehealth: Payer: Self-pay | Admitting: *Deleted

## 2019-11-10 LAB — CULTURE, OB URINE: Culture: <10000 — AB

## 2019-11-10 NOTE — Telephone Encounter (Signed)
Transition Care Management Unsuccessful Follow-up Telephone Call  Date of discharge and from where:  11/09/19 [facility]  Attempts:  2nd Attempt  Reason for unsuccessful TCM follow-up call:  Unable to reach patient

## 2019-11-10 NOTE — Telephone Encounter (Signed)
Transition Care Management Unsuccessful Follow-up Telephone Call  Date of discharge and from where:  11/09/19 [facility]  Attempts:  1st Attempt  Reason for unsuccessful TCM follow-up call:  Unable to reach patient

## 2019-11-13 ENCOUNTER — Other Ambulatory Visit: Payer: Self-pay | Admitting: Lactation Services

## 2019-11-13 MED ORDER — TERCONAZOLE 0.4 % VA CREA: 1.0000 | TOPICAL_CREAM | Freq: Every day | VAGINAL | 0 refills | Status: DC

## 2019-11-13 NOTE — Telephone Encounter (Signed)
Transition Care Management Unsuccessful Follow-up Telephone Call  Date of discharge and from where:  11/09/19 [facility]  Attempts:  3rd Attempt  Reason for unsuccessful TCM follow-up call:  Unable to reach patient

## 2019-11-15 ENCOUNTER — Ambulatory Visit (INDEPENDENT_AMBULATORY_CARE_PROVIDER_SITE_OTHER): Payer: Medicaid Other | Admitting: Obstetrics and Gynecology

## 2019-11-15 ENCOUNTER — Other Ambulatory Visit: Payer: Self-pay

## 2019-11-15 ENCOUNTER — Encounter: Payer: Self-pay | Admitting: Obstetrics and Gynecology

## 2019-11-15 VITALS — BP 138/83 | HR 111 | Wt 244.0 lb

## 2019-11-15 DIAGNOSIS — O99213 Obesity complicating pregnancy, third trimester: Secondary | ICD-10-CM

## 2019-11-15 DIAGNOSIS — N898 Other specified noninflammatory disorders of vagina: Secondary | ICD-10-CM

## 2019-11-15 DIAGNOSIS — Z3493 Encounter for supervision of normal pregnancy, unspecified, third trimester: Secondary | ICD-10-CM

## 2019-11-15 DIAGNOSIS — Z3A36 36 weeks gestation of pregnancy: Secondary | ICD-10-CM

## 2019-11-15 MED ORDER — NYSTATIN-TRIAMCINOLONE 100000-0.1 UNIT/GM-% EX OINT: 1.0000 "application " | TOPICAL_OINTMENT | Freq: Two times a day (BID) | CUTANEOUS | 0 refills | Status: AC

## 2019-11-15 NOTE — Progress Notes (Signed)
   LOW-RISK PREGNANCY OFFICE VISIT Patient name: Jessica Lutz MRN [id number]  Date of birth: 12/23/1985 Chief Complaint:   Routine Prenatal Visit  History of Present Illness:   Jessica Lutz is a 34 y.o. G26P1021 female at [redacted]w[redacted]d with an Estimated Date of Delivery: 12/12/19 being seen today for ongoing management of a low-risk pregnancy.  Today she reports occasional contractions and "ready to be done". She also complains of still having vaginal irritation and itching. She started using  Contractions: Not present. Vag. Bleeding: None.  Movement: Present. denies leaking of fluid. Review of Systems:   Pertinent items are noted in HPI Denies abnormal vaginal discharge w/ itching/odor/irritation, headaches, visual changes, shortness of breath, chest pain, abdominal pain, severe nausea/vomiting, or problems with urination or bowel movements unless otherwise stated above. Pertinent History Reviewed:  Reviewed past medical,surgical, social, obstetrical and family history.  Reviewed problem list, medications and allergies. Physical Assessment:   Vitals:   11/15/19 1342  BP: 138/83  Pulse: (!) 111  Weight: 244 lb (110.7 kg)  Body mass index is 39.38 kg/m.        Physical Examination:   General appearance: Well appearing, and in no distress  Mental status: Alert, oriented to person, place, and time  Skin: Warm & dry  Cardiovascular: Normal heart rate noted  Respiratory: Normal respiratory effort, no distress  Abdomen: Soft, gravid, nontender  Pelvic: Cervical exam performed  Dilation: 4 Effacement (%): 70 Station: -2 cervix more posterior than prior exams  Extremities: Edema: Trace  Fetal Status: Fetal Heart Rate (bpm): 155 Fundal Height: 38 cm Movement: Present Presentation: Vertex  No results found for this or any previous visit (from the past 24 hour(s)).  Assessment & Plan:  1) Low-risk pregnancy G4P1021 at [redacted]w[redacted]d with an Estimated Date of Delivery: 12/12/19   2) Encounter for  supervision of low-risk pregnancy in third trimester - S/P admission for PTL and cervical dilation  3) Vaginal irritation  - Rx for nystatin-triamcinolone ointment (MYCOLOG)  4) Obesity affecting pregnancy in third trimester  5) [redacted] weeks gestation of pregnancy    Meds:  Meds ordered this encounter  Medications  . nystatin-triamcinolone ointment (MYCOLOG)    Sig: Apply 1 application topically 2 (two) times daily for 7 days.    Dispense:  14 g    Refill:  0    Order Specific Question:   Supervising Provider    Answer:   Reva Bores [[id number]]   Labs/procedures today: cervical exam  Plan:  Continue routine obstetrical care   Reviewed: Preterm labor symptoms and general obstetric precautions including but not limited to vaginal bleeding, contractions, leaking of fluid and fetal movement were reviewed in detail with the patient.  All questions were answered. Ha home bp cuff. Check bp weekly, let us know if >140/90.   Follow-up: Return in about 1 week (around 11/22/2019) for Return OB visit.  No orders of the defined types were placed in this encounter.  Raelyn Mora MSN, [name] 11/15/2019

## 2019-11-15 NOTE — Patient Instructions (Signed)

## 2019-11-22 ENCOUNTER — Other Ambulatory Visit (HOSPITAL_COMMUNITY)
Admission: RE | Admit: 2019-11-22 | Discharge: 2019-11-22 | Disposition: A | Discharge status: Home / Self Care | Payer: Medicaid Other | Source: Ambulatory Visit | Attending: Family Medicine | Admitting: Family Medicine

## 2019-11-22 ENCOUNTER — Other Ambulatory Visit: Payer: Self-pay

## 2019-11-22 ENCOUNTER — Ambulatory Visit (INDEPENDENT_AMBULATORY_CARE_PROVIDER_SITE_OTHER): Payer: Medicaid Other | Admitting: Family Medicine

## 2019-11-22 VITALS — BP 123/76 | HR 116 | Wt 246.2 lb

## 2019-11-22 DIAGNOSIS — Z3493 Encounter for supervision of normal pregnancy, unspecified, third trimester: Secondary | ICD-10-CM | POA: Insufficient documentation

## 2019-11-22 DIAGNOSIS — O99213 Obesity complicating pregnancy, third trimester: Secondary | ICD-10-CM

## 2019-11-22 DIAGNOSIS — B951 Streptococcus, group B, as the cause of diseases classified elsewhere: Secondary | ICD-10-CM

## 2019-11-22 DIAGNOSIS — Z148 Genetic carrier of other disease: Secondary | ICD-10-CM

## 2019-11-22 DIAGNOSIS — D563 Thalassemia minor: Secondary | ICD-10-CM

## 2019-11-22 NOTE — Progress Notes (Signed)
   PRENATAL VISIT NOTE  Subjective:  Jessica Lutz is a 34 y.o. [id number] at 11w1dbeing seen today for ongoing prenatal care.  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  Patient reports no complaints.  Contractions: Irritability. Vag. Bleeding: None.  Movement: Present. Denies leaking of fluid.   The following portions of the patient's history were reviewed and updated as appropriate: allergies, current medications, past family history, past medical history, past social history, past surgical history and problem list.   Objective:   Vitals:   11/22/19 1057  BP: 123/76  Pulse: (!) 116  Weight: 246 lb 3.2 oz (111.7 kg)    Fetal Status: Fetal Heart Rate (bpm): 156   Movement: Present     General:  Alert, oriented and cooperative. Patient is in no acute distress.  Skin: Skin is warm and dry. No rash noted.   Cardiovascular: Normal heart rate noted  Respiratory: Normal respiratory effort, no problems with respiration noted  Abdomen: Soft, gravid, appropriate for gestational age.  Pain/Pressure: Present     Pelvic: Cervical exam deferred        Extremities: Normal range of motion.  Edema: Trace  Mental Status: Normal mood and affect. Normal behavior. Normal judgment and thought content.   Assessment and Plan:  Pregnancy: G4P1021 at 319w1d. Encounter for supervision of low-risk pregnancy in third trimester -doing well without complaints  2. Alpha thalassemia silent carrier 3. 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  4. Preterm labor in third trimester without delivery -Admitted to L&D 9/29-9/30, received BMZx2 and amp at that time. -Was previously having regular contractions with cervical change, was discharged after 24 hours without cervical  change. -Denies regular contractions or pain/pressure currently. Cervical exam deferred today.  5. Positive GBS test Positive PCR on recent admission, will require prophylaxis in labor.  6. Obesity affecting pregnancy in third trimester Taking bASA. BMI 40  Term labor symptoms and general obstetric precautions including but not limited to vaginal bleeding, contractions, leaking of fluid and fetal movement were reviewed in detail with the patient. Please refer to After Visit Summary for other counseling recommendations.   No follow-ups on file.  Future Appointments  Date Time Provider DeIssaquah10/21/2021  9:15 AM KoBrown HumanMV[facility]10/27/2021  3:35 PM BuVirginia RochesterNP WMAbilene [facility]11/04/2019  3:35 PM HoTresea MallCNM WMPa[facility] LPMO[facility]  AlArrie SenateMD

## 2019-11-22 NOTE — Addendum Note (Signed)
Addended by: Faythe Casa on: 11/22/2019 03:04 PM   Modules accepted: Orders

## 2019-11-23 LAB — GC/CHLAMYDIA PROBE AMP (~~LOC~~) NOT AT ARMC
Chlamydia: NEGATIVE
Comment: NEGATIVE
Comment: NORMAL
Neisseria Gonorrhea: NEGATIVE

## 2019-11-24 ENCOUNTER — Other Ambulatory Visit: Payer: Self-pay

## 2019-11-24 ENCOUNTER — Encounter (HOSPITAL_COMMUNITY): Payer: Self-pay | Admitting: Obstetrics & Gynecology

## 2019-11-24 ENCOUNTER — Inpatient Hospital Stay (HOSPITAL_COMMUNITY)
Admission: AD | Admit: 2019-11-24 | Discharge: 2019-11-26 | DRG: 805 | Disposition: A | Discharge status: Home / Self Care | Payer: Medicaid Other | Attending: Obstetrics & Gynecology | Admitting: Obstetrics & Gynecology

## 2019-11-24 DIAGNOSIS — O285 Abnormal chromosomal and genetic finding on antenatal screening of mother: Secondary | ICD-10-CM

## 2019-11-24 DIAGNOSIS — B951 Streptococcus, group B, as the cause of diseases classified elsewhere: Secondary | ICD-10-CM | POA: Diagnosis not present

## 2019-11-24 DIAGNOSIS — D563 Thalassemia minor: Secondary | ICD-10-CM

## 2019-11-24 DIAGNOSIS — U071 COVID-19: Secondary | ICD-10-CM

## 2019-11-24 DIAGNOSIS — Z148 Genetic carrier of other disease: Secondary | ICD-10-CM

## 2019-11-24 DIAGNOSIS — O99824 Streptococcus B carrier state complicating childbirth: Secondary | ICD-10-CM | POA: Diagnosis present

## 2019-11-24 DIAGNOSIS — O1493 Unspecified pre-eclampsia, third trimester: Secondary | ICD-10-CM | POA: Diagnosis not present

## 2019-11-24 DIAGNOSIS — O9852 Other viral diseases complicating childbirth: Secondary | ICD-10-CM | POA: Diagnosis not present

## 2019-11-24 DIAGNOSIS — Z3A37 37 weeks gestation of pregnancy: Secondary | ICD-10-CM

## 2019-11-24 DIAGNOSIS — O1404 Mild to moderate pre-eclampsia, complicating childbirth: Secondary | ICD-10-CM | POA: Diagnosis not present

## 2019-11-24 DIAGNOSIS — O99820 Streptococcus B carrier state complicating pregnancy: Secondary | ICD-10-CM | POA: Diagnosis not present

## 2019-11-24 DIAGNOSIS — O368130 Decreased fetal movements, third trimester, not applicable or unspecified: Principal | ICD-10-CM | POA: Diagnosis present

## 2019-11-24 DIAGNOSIS — O479 False labor, unspecified: Secondary | ICD-10-CM

## 2019-11-24 DIAGNOSIS — O98513 Other viral diseases complicating pregnancy, third trimester: Secondary | ICD-10-CM | POA: Diagnosis not present

## 2019-11-24 DIAGNOSIS — O4703 False labor before 37 completed weeks of gestation, third trimester: Secondary | ICD-10-CM | POA: Diagnosis not present

## 2019-11-24 DIAGNOSIS — O1400 Mild to moderate pre-eclampsia, unspecified trimester: Secondary | ICD-10-CM | POA: Diagnosis present

## 2019-11-24 DIAGNOSIS — O0990 Supervision of high risk pregnancy, unspecified, unspecified trimester: Secondary | ICD-10-CM | POA: Insufficient documentation

## 2019-11-24 LAB — RESPIRATORY PANEL BY RT PCR (FLU A&B, COVID)
Influenza A by PCR: NEGATIVE
Influenza B by PCR: NEGATIVE
SARS Coronavirus 2 by RT PCR: POSITIVE — AB

## 2019-11-24 LAB — CBC
HCT: 36.6 % (ref 36.0–46.0)
HCT: 35.3 % — ABNORMAL LOW (ref 36.0–46.0)
Hemoglobin: 11.5 g/dL — ABNORMAL LOW (ref 12.0–15.0)
Hemoglobin: 11.5 g/dL — ABNORMAL LOW (ref 12.0–15.0)
MCH: 26 pg (ref 26.0–34.0)
MCH: 26.7 pg (ref 26.0–34.0)
MCHC: 31.4 g/dL (ref 30.0–36.0)
MCHC: 32.6 g/dL (ref 30.0–36.0)
MCV: 82.8 fL (ref 80.0–100.0)
MCV: 81.9 fL (ref 80.0–100.0)
Platelets: 233 10*3/uL (ref 150–400)
Platelets: 234 10*3/uL (ref 150–400)
RBC: 4.42 MIL/uL (ref 3.87–5.11)
RBC: 4.31 MIL/uL (ref 3.87–5.11)
RDW: 14.3 % (ref 11.5–15.5)
RDW: 14.3 % (ref 11.5–15.5)
WBC: 7.6 10*3/uL (ref 4.0–10.5)
WBC: 9.7 10*3/uL (ref 4.0–10.5)
nRBC: 0 % (ref 0.0–0.2)
nRBC: 0 % (ref 0.0–0.2)

## 2019-11-24 LAB — COMPREHENSIVE METABOLIC PANEL
ALT: 14 U/L (ref 0–44)
AST: 17 U/L (ref 15–41)
Albumin: 2.8 g/dL — ABNORMAL LOW (ref 3.5–5.0)
Alkaline Phosphatase: 121 U/L (ref 38–126)
Anion gap: 10 (ref 5–15)
BUN: 7 mg/dL (ref 6–20)
CO2: 18 mmol/L — ABNORMAL LOW (ref 22–32)
Calcium: 10.3 mg/dL (ref 8.9–10.3)
Chloride: 107 mmol/L (ref 98–111)
Creatinine, Ser: 0.75 mg/dL (ref 0.44–1.00)
GFR, Estimated: >60 mL/min (ref >60)
Glucose, Bld: 110 mg/dL — ABNORMAL HIGH (ref 70–99)
Potassium: 3.7 mmol/L (ref 3.5–5.1)
Sodium: 135 mmol/L (ref 135–145)
Total Bilirubin: 0.2 mg/dL — ABNORMAL LOW (ref 0.3–1.2)
Total Protein: 6.4 g/dL — ABNORMAL LOW (ref 6.5–8.1)

## 2019-11-24 LAB — TYPE AND SCREEN
ABO/RH(D): A POS
Antibody Screen: NEGATIVE

## 2019-11-24 LAB — PROTEIN / CREATININE RATIO, URINE
Creatinine, Urine: 60.84 mg/dL
Protein Creatinine Ratio: 0.48 mg/mg{Cre} — ABNORMAL HIGH (ref 0.00–0.15)
Total Protein, Urine: 29 mg/dL

## 2019-11-24 MED ORDER — ONDANSETRON HCL 4 MG/2ML IJ SOLN: 4.0000 mg | Freq: Four times a day (QID) | INTRAMUSCULAR | Status: DC | PRN

## 2019-11-24 MED ORDER — FENTANYL CITRATE (PF) 100 MCG/2ML IJ SOLN
100.0000 ug | INTRAMUSCULAR | Status: DC | PRN
  Administered 2019-11-24 (×2): 100 ug via INTRAVENOUS
  Filled 2019-11-24 (×2): qty 2

## 2019-11-24 MED ORDER — ACETAMINOPHEN 325 MG PO TABS: 650.0000 mg | ORAL_TABLET | ORAL | Status: DC | PRN

## 2019-11-24 MED ORDER — LACTATED RINGERS IV BOLUS
1000.0000 mL | Freq: Once | INTRAVENOUS | Status: AC
  Administered 2019-11-24: 1000 mL via INTRAVENOUS

## 2019-11-24 MED ORDER — LABETALOL HCL 5 MG/ML IV SOLN: 20.0000 mg | INTRAVENOUS | Status: DC | PRN

## 2019-11-24 MED ORDER — LACTATED RINGERS IV SOLN
500.0000 mL | INTRAVENOUS | Status: DC | PRN
  Administered 2019-11-25: 500 mL via INTRAVENOUS

## 2019-11-24 MED ORDER — OXYTOCIN-SODIUM CHLORIDE 30-0.9 UT/500ML-% IV SOLN
1.0000 m[IU]/min | INTRAVENOUS | Status: DC
  Administered 2019-11-24: 2 m[IU]/min via INTRAVENOUS
  Filled 2019-11-24: qty 500

## 2019-11-24 MED ORDER — PHENYLEPHRINE 40 MCG/ML (10ML) SYRINGE FOR IV PUSH (FOR BLOOD PRESSURE SUPPORT)
80.0000 ug | PREFILLED_SYRINGE | INTRAVENOUS | Status: DC | PRN
  Filled 2019-11-24: qty 10

## 2019-11-24 MED ORDER — HYDRALAZINE HCL 20 MG/ML IJ SOLN: 10.0000 mg | INTRAMUSCULAR | Status: DC | PRN

## 2019-11-24 MED ORDER — LIDOCAINE HCL (PF) 1 % IJ SOLN: 30.0000 mL | INTRAMUSCULAR | Status: DC | PRN

## 2019-11-24 MED ORDER — OXYCODONE-ACETAMINOPHEN 5-325 MG PO TABS: 2.0000 | ORAL_TABLET | ORAL | 0 refills | Status: DC | PRN

## 2019-11-24 MED ORDER — LACTATED RINGERS IV SOLN: INTRAVENOUS | Status: DC

## 2019-11-24 MED ORDER — LABETALOL HCL 5 MG/ML IV SOLN: 80.0000 mg | INTRAVENOUS | Status: DC | PRN

## 2019-11-24 MED ORDER — OXYTOCIN BOLUS FROM INFUSION
333.0000 mL | Freq: Once | INTRAVENOUS | Status: AC
  Administered 2019-11-25: 333 mL via INTRAVENOUS

## 2019-11-24 MED ORDER — LABETALOL HCL 5 MG/ML IV SOLN: 40.0000 mg | INTRAVENOUS | Status: DC | PRN

## 2019-11-24 MED ORDER — LACTATED RINGERS IV SOLN
500.0000 mL | Freq: Once | INTRAVENOUS | Status: AC
  Administered 2019-11-25: 500 mL via INTRAVENOUS

## 2019-11-24 MED ORDER — ONDANSETRON HCL 4 MG/2ML IJ SOLN
4.0000 mg | Freq: Once | INTRAMUSCULAR | Status: AC
  Administered 2019-11-24: 4 mg via INTRAVENOUS
  Filled 2019-11-24: qty 2

## 2019-11-24 MED ORDER — EPHEDRINE 5 MG/ML INJ
10.0000 mg | INTRAVENOUS | Status: DC | PRN
  Filled 2019-11-24: qty 2

## 2019-11-24 MED ORDER — SOD CITRATE-CITRIC ACID 500-334 MG/5ML PO SOLN: 30.0000 mL | ORAL | Status: DC | PRN

## 2019-11-24 MED ORDER — OXYTOCIN-SODIUM CHLORIDE 30-0.9 UT/500ML-% IV SOLN
2.5000 [IU]/h | INTRAVENOUS | Status: DC
  Administered 2019-11-25: 2.5 [IU]/h via INTRAVENOUS

## 2019-11-24 MED ORDER — TERBUTALINE SULFATE 1 MG/ML IJ SOLN: 0.2500 mg | Freq: Once | INTRAMUSCULAR | Status: DC | PRN

## 2019-11-24 MED ORDER — ONDANSETRON HCL 4 MG/2ML IJ SOLN: 4.0000 mg | Freq: Once | INTRAMUSCULAR | Status: DC

## 2019-11-24 MED ORDER — DIPHENHYDRAMINE HCL 50 MG/ML IJ SOLN: 12.5000 mg | INTRAMUSCULAR | Status: DC | PRN

## 2019-11-24 MED ORDER — OXYCODONE-ACETAMINOPHEN 5-325 MG PO TABS: 1.0000 | ORAL_TABLET | ORAL | Status: DC | PRN

## 2019-11-24 MED ORDER — FENTANYL-BUPIVACAINE-NACL 0.5-0.125-0.9 MG/250ML-% EP SOLN
12.0000 mL/h | EPIDURAL | Status: DC | PRN
  Filled 2019-11-24: qty 250

## 2019-11-24 MED ORDER — SODIUM CHLORIDE 0.9 % IV SOLN
5.0000 10*6.[IU] | Freq: Once | INTRAVENOUS | Status: AC
  Administered 2019-11-24: 5 10*6.[IU] via INTRAVENOUS
  Filled 2019-11-24: qty 5

## 2019-11-24 MED ORDER — OXYCODONE-ACETAMINOPHEN 5-325 MG PO TABS: 2.0000 | ORAL_TABLET | ORAL | Status: DC | PRN

## 2019-11-24 MED ORDER — PENICILLIN G POT IN DEXTROSE 60000 UNIT/ML IV SOLN
3.0000 10*6.[IU] | INTRAVENOUS | Status: DC
  Administered 2019-11-24 – 2019-11-25 (×4): 3 10*6.[IU] via INTRAVENOUS
  Filled 2019-11-24 (×9): qty 50

## 2019-11-24 NOTE — H&P (Signed)
Chief Complaint:  Contractions   None     HPI: Jessica Lutz is a 34 y.o. [id number] at [redacted]w[redacted]d by LMP who presents to maternity admissions for labor evaluation with painful contractions and decreased fetal movement.   Pt with constant abdominal pain on right and left lower sides of abdomen, plus intermittent cramping pain with contractions.  She reports she has not felt much movement since the contractions started today.  Cervix 4.5 cm with tx 11/08/19 for PTL with BMZ.   Location: low abdomen, low back, radiates into vagina Quality: cramping Severity: 9/10 on pain scale Duration: 1 day Timing: every 2 minutes Modifying factors: none Associated signs and symptoms: decreased fetal movement  HTN at 24 week visit, no h/a, epigastric pain, or visual disturbances today   HPI  Past Medical History: Past Medical History:  Diagnosis Date  . Anemia 03/22/2018    Past obstetric history: OB History  Gravida Para Term Preterm AB Living  4 1 1   2 1   SAB TAB Ectopic Multiple Live Births  2       1    # Outcome Date GA Lbr Len/2nd Weight Sex Delivery Anes PTL Lv  4 Current           3 SAB 03/30/13    U         Birth Comments: System Generated. Please review and update pregnancy details.  2 SAB 01/09/11    U      1 Term 04/13/10 [redacted]w[redacted]d  3714 g F Vag-Vacuum   LIV     Birth Comments: System Generated. Please review and update pregnancy details. wnl    Past Surgical History: Past Surgical History:  Procedure Laterality Date  . NO PAST SURGERIES      Family History: Family History  Problem Relation Age of Onset  . Thyroid disease Mother   . Alcohol abuse Neg Hx     Social History: Social History   Tobacco Use  . Smoking status: Never Smoker  . Smokeless tobacco: Never Used  Vaping Use  . Vaping Use: Never used  Substance Use Topics  . Alcohol use: Not Currently    Comment: occ  . Drug use: Not Currently    Types: Marijuana    Comment: last used 3 wks ago from 07/18/19     Allergies: No Known Allergies  Meds:  Medications Prior to Admission  Medication Sig Dispense Refill Last Dose  . prenatal vitamin w/FE, FA (PRENATAL 1 + 1) 27-1 MG TABS tablet Take 1 tablet by mouth daily at 12 noon. 90 tablet 3 Past Month at Unknown time  . acetaminophen (TYLENOL) 325 MG tablet Take 2 tablets (650 mg total) by mouth every 6 (six) hours as needed for mild pain or moderate pain (mild discomfortORtemperature>/= 100.4 F).     09/17/19 aspirin EC 81 MG tablet Take 1 tablet (81 mg total) by mouth daily. 30 tablet 6 More than a month at Unknown time  . Blood Pressure Monitoring DEVI 1 Device by Does not apply route once a week. 1 Device 0   . terconazole (TERAZOL 7) 0.4 % vaginal cream Place 1 applicator vaginally at bedtime. (Patient not taking: Reported on 11/22/2019) 45 g 0     ROS:  Review of Systems  Constitutional: Negative for chills, fatigue and fever.  Eyes: Negative for visual disturbance.  Respiratory: Negative for shortness of breath.   Cardiovascular: Negative for chest pain.  Gastrointestinal: Positive for abdominal pain. Negative for nausea  and vomiting.  Genitourinary: Positive for pelvic pain and vaginal pain. Negative for difficulty urinating, dysuria, flank pain, vaginal bleeding and vaginal discharge.  Musculoskeletal: Positive for back pain.  Neurological: Negative for dizziness and headaches.  Psychiatric/Behavioral: Negative.      I have reviewed patient's Past Medical Hx, Surgical Hx, Family Hx, Social Hx, medications and allergies.   Physical Exam   Patient Vitals for the past 24 hrs:  BP Temp Temp src Pulse Resp SpO2  11/24/19 0745 126/85 -- -- 98 18 97 %  11/24/19 0300 124/74 -- -- (!) 103 -- --  11/24/19 0200 126/83 -- -- (!) 111 -- --  11/24/19 0145 -- -- -- -- -- 99 %  11/24/19 0140 -- -- -- -- -- 100 %  11/24/19 0135 -- -- -- -- -- 100 %  11/24/19 0131 117/84 -- -- (!) 106 -- --  11/24/19 0130 -- -- -- -- -- 97 %  11/24/19 0101  127/86 -- -- (!) 109 -- --  11/24/19 0045 131/82 -- -- (!) 107 -- --  11/24/19 0039 (!) 146/93 97.9 F (36.6 C) Oral (!) 109 20 100 %   Constitutional: Well-developed, well-nourished female in no acute distress.  HEART: normal rate, heart sounds, regular rhythm RESP: normal effort, lung sounds clear and equal bilaterally GI: Abd soft, non-tender, gravid appropriate for gestational age.  MS: Extremities nontender, +1 BLE edema, normal ROM Neurologic: Alert and oriented x 4.  GU: Neg CVAT.   Dilation: 5 Effacement (%): 70 Cervical Position: Posterior Station: -2 Presentation: Vertex Exam by:: Sharen Counter, [name]  FHT:  Baseline 150 , moderate variability, accelerations present, no decelerations Contractions: q 2-5 mins, mild to moderate to palpation   Labs: Results for orders placed or performed during the hospital encounter of 11/24/19 (from the past 24 hour(s))  CBC     Status: Abnormal   Collection Time: 11/24/19  2:20 AM  Result Value Ref Range   WBC 7.6 4.0 - 10.5 K/uL   RBC 4.42 3.87 - 5.11 MIL/uL   Hemoglobin 11.5 (L) 12.0 - 15.0 g/dL   HCT 11.9 36 - 46 %   MCV 82.8 80.0 - 100.0 fL   MCH 26.0 26.0 - 34.0 pg   MCHC 31.4 30.0 - 36.0 g/dL   RDW 41.7 40.8 - 14.4 %   Platelets 233 150 - 400 K/uL   nRBC 0.0 0.0 - 0.2 %  Comprehensive metabolic panel     Status: Abnormal   Collection Time: 11/24/19  2:20 AM  Result Value Ref Range   Sodium 135 135 - 145 mmol/L   Potassium 3.7 3.5 - 5.1 mmol/L   Chloride 107 98 - 111 mmol/L   CO2 18 (L) 22 - 32 mmol/L   Glucose, Bld 110 (H) 70 - 99 mg/dL   BUN 7 6 - 20 mg/dL   Creatinine, Ser 8.18 0.44 - 1.00 mg/dL   Calcium 56.3 8.9 - 14.9 mg/dL   Total Protein 6.4 (L) 6.5 - 8.1 g/dL   Albumin 2.8 (L) 3.5 - 5.0 g/dL   AST 17 15 - 41 U/L   ALT 14 0 - 44 U/L   Alkaline Phosphatase 121 38 - 126 U/L   Total Bilirubin 0.2 (L) 0.3 - 1.2 mg/dL   GFR, Estimated >70 >26 mL/min   Anion gap 10 5 - 15  Protein / creatinine ratio,  urine     Status: Abnormal   Collection Time: 11/24/19  3:31 AM  Result Value Ref Range  Creatinine, Urine 60.84 mg/dL   Total Protein, Urine 29 mg/dL   Protein Creatinine Ratio 0.48 (H) 0.00 - 0.15 mg/mg[Cre]  Respiratory Panel by RT PCR (Flu A&B, Covid) - Nasopharyngeal Swab     Status: Abnormal   Collection Time: 11/24/19  5:02 AM   Specimen: Nasopharyngeal Swab  Result Value Ref Range   SARS Coronavirus 2 by RT PCR POSITIVE (A) NEGATIVE   Influenza A by PCR NEGATIVE NEGATIVE   Influenza B by PCR NEGATIVE NEGATIVE   --/--/A POS (09/29 0810)  Imaging:  No results found.  MAU Course/MDM: Orders Placed This Encounter  Procedures  . Respiratory Panel by RT PCR (Flu A&B, Covid) - Nasopharyngeal Swab  . CBC  . Comprehensive metabolic panel  . Protein / creatinine ratio, urine  . Discharge patient    Meds ordered this encounter  Medications  . lactated ringers bolus 1,000 mL  . fentaNYL (SUBLIMAZE) injection 100 mcg  . ondansetron (ZOFRAN) injection 4 mg  . ondansetron (ZOFRAN) injection 4 mg  . DISCONTD: ondansetron (ZOFRAN) injection 4 mg  . oxyCODONE-acetaminophen (PERCOCET/ROXICET) 5-325 MG tablet    Sig: Take 2 tablets by mouth every 4 (four) hours as needed.    Dispense:  4 tablet    Refill:  0    Order Specific Question:   Supervising Provider    Answer:   Jaynie Collins A [[id number]]     NST reviewed and reactive Initial BP elevated, no s/sx of PEC Cervix 4.5/80/-2, unchanged in 1 hour following exam from RN but pt breathing with frequent contractions at this time IV started and IV Fentanyl given for pain management PEC labs ordered and sent  P/C ratio 0.48 Cervix remains 4-5 cm/70%/-2, vertex  Consult Dr Lovett Sox with presentation, exam findings and test results.  Given elevated BP earlier in pregnancy and HTN with elevated P/C ratio today, will admit for IOL for preeclampsia today GBS positive  Assessment: 1. Threatened labor at term   2. Lab test positive  for detection of COVID-19 virus   3. [redacted] weeks gestation of pregnancy   4. Pre-eclampsia in third trimester     Plan: Admit to L&D PCN for GBS positive Pitocin IV for IOL Anticipate NSVD   Follow-up Information    [facility] Follow up.   Specialty: Obstetrics and Gynecology Why: Return to MAU as needed for emergencies. Contact information: [address] [place] [address] [phone]

## 2019-11-24 NOTE — MAU Note (Signed)
Positive covid result called to dr Selena Batten

## 2019-11-24 NOTE — MAU Note (Signed)
Food tray ordered for patient.

## 2019-11-24 NOTE — MAU Note (Signed)
Pt reports constant cramping.   Denies LOF, Denies vaginal bleeding    Pt reports decreased fetal movement today

## 2019-11-24 NOTE — Progress Notes (Signed)
Patient ID: Jessica Lutz, female   DOB: 18-Feb-1985, 34 y.o.   MRN: [id number]  Feeling her ctx as a little stronger than before; has had Pitocin going since 1630  BP 138/84, 125/87, 137/88 FHR 140s, +accels, no decels, occ mi variables, Cat 1 Ctx 2-4 mins with Pit at 51mu/min Cx deferred (was 5+/60/post/vtx -2 at start of Pit)  IUP@37 .3wks Pre-e without SF Asymptomatic Covid+ IOL process  Continue titrating Pitocin up to achieve active labor Plan for AROM when she begins to be uncomfortable Anticipate vag del  [name] [name] 11/24/2019 9:51 PM

## 2019-11-25 ENCOUNTER — Inpatient Hospital Stay (HOSPITAL_COMMUNITY): Payer: Medicaid Other | Admitting: Anesthesiology

## 2019-11-25 ENCOUNTER — Encounter (HOSPITAL_COMMUNITY): Payer: Self-pay | Admitting: Family Medicine

## 2019-11-25 DIAGNOSIS — O1400 Mild to moderate pre-eclampsia, unspecified trimester: Secondary | ICD-10-CM | POA: Diagnosis present

## 2019-11-25 DIAGNOSIS — O9852 Other viral diseases complicating childbirth: Secondary | ICD-10-CM | POA: Diagnosis not present

## 2019-11-25 DIAGNOSIS — O164 Unspecified maternal hypertension, complicating childbirth: Secondary | ICD-10-CM | POA: Diagnosis not present

## 2019-11-25 DIAGNOSIS — O1404 Mild to moderate pre-eclampsia, complicating childbirth: Secondary | ICD-10-CM | POA: Diagnosis not present

## 2019-11-25 DIAGNOSIS — Z3A37 37 weeks gestation of pregnancy: Secondary | ICD-10-CM | POA: Diagnosis not present

## 2019-11-25 DIAGNOSIS — U071 COVID-19: Secondary | ICD-10-CM | POA: Diagnosis present

## 2019-11-25 DIAGNOSIS — O99824 Streptococcus B carrier state complicating childbirth: Secondary | ICD-10-CM | POA: Diagnosis not present

## 2019-11-25 HISTORY — DX: Mild to moderate pre-eclampsia, unspecified trimester: O14.00

## 2019-11-25 LAB — CBC WITH DIFFERENTIAL/PLATELET
Abs Immature Granulocytes: 0.04 10*3/uL (ref 0.00–0.07)
Basophils Absolute: 0 10*3/uL (ref 0.0–0.1)
Basophils Relative: 0 %
Eosinophils Absolute: 0 10*3/uL (ref 0.0–0.5)
Eosinophils Relative: 0 %
HCT: 34.8 % — ABNORMAL LOW (ref 36.0–46.0)
Hemoglobin: 11.1 g/dL — ABNORMAL LOW (ref 12.0–15.0)
Immature Granulocytes: 1 %
Lymphocytes Relative: 23 %
Lymphs Abs: 1.9 10*3/uL (ref 0.7–4.0)
MCH: 26.5 pg (ref 26.0–34.0)
MCHC: 31.9 g/dL (ref 30.0–36.0)
MCV: 83.1 fL (ref 80.0–100.0)
Monocytes Absolute: 0.7 10*3/uL (ref 0.1–1.0)
Monocytes Relative: 9 %
Neutro Abs: 5.4 10*3/uL (ref 1.7–7.7)
Neutrophils Relative %: 67 %
Platelets: 214 10*3/uL (ref 150–400)
RBC: 4.19 MIL/uL (ref 3.87–5.11)
RDW: 14.4 % (ref 11.5–15.5)
WBC: 8 10*3/uL (ref 4.0–10.5)
nRBC: 0 % (ref 0.0–0.2)

## 2019-11-25 LAB — RPR: RPR Ser Ql: NONREACTIVE

## 2019-11-25 MED ORDER — IBUPROFEN 600 MG PO TABS
600.0000 mg | ORAL_TABLET | Freq: Four times a day (QID) | ORAL | Status: DC
  Administered 2019-11-25 – 2019-11-26 (×4): 600 mg via ORAL
  Filled 2019-11-25 (×4): qty 1

## 2019-11-25 MED ORDER — WITCH HAZEL-GLYCERIN EX PADS: 1.0000 "application " | MEDICATED_PAD | CUTANEOUS | Status: DC | PRN

## 2019-11-25 MED ORDER — COCONUT OIL OIL: 1.0000 "application " | TOPICAL_OIL | Status: DC | PRN

## 2019-11-25 MED ORDER — BENZOCAINE-MENTHOL 20-0.5 % EX AERO: 1.0000 "application " | INHALATION_SPRAY | CUTANEOUS | Status: DC | PRN

## 2019-11-25 MED ORDER — ACETAMINOPHEN 325 MG PO TABS: 650.0000 mg | ORAL_TABLET | Freq: Four times a day (QID) | ORAL | Status: DC | PRN

## 2019-11-25 MED ORDER — LIDOCAINE HCL (PF) 1 % IJ SOLN
INTRAMUSCULAR | Status: DC | PRN
  Administered 2019-11-25: 5 mL via EPIDURAL

## 2019-11-25 MED ORDER — DIPHENHYDRAMINE HCL 25 MG PO CAPS: 25.0000 mg | ORAL_CAPSULE | Freq: Four times a day (QID) | ORAL | Status: DC | PRN

## 2019-11-25 MED ORDER — ONDANSETRON HCL 4 MG PO TABS: 4.0000 mg | ORAL_TABLET | ORAL | Status: DC | PRN

## 2019-11-25 MED ORDER — TETANUS-DIPHTH-ACELL PERTUSSIS 5-2.5-18.5 LF-MCG/0.5 IM SUSP: 0.5000 mL | Freq: Once | INTRAMUSCULAR | Status: DC

## 2019-11-25 MED ORDER — SODIUM CHLORIDE (PF) 0.9 % IJ SOLN
INTRAMUSCULAR | Status: DC | PRN
  Administered 2019-11-25: 12 mL/h via EPIDURAL

## 2019-11-25 MED ORDER — ONDANSETRON HCL 4 MG/2ML IJ SOLN: 4.0000 mg | INTRAMUSCULAR | Status: DC | PRN

## 2019-11-25 MED ORDER — PRENATAL MULTIVITAMIN CH
1.0000 | ORAL_TABLET | Freq: Every day | ORAL | Status: DC
  Administered 2019-11-26: 1 via ORAL
  Filled 2019-11-25: qty 1

## 2019-11-25 MED ORDER — ZOLPIDEM TARTRATE 5 MG PO TABS: 5.0000 mg | ORAL_TABLET | Freq: Every evening | ORAL | Status: DC | PRN

## 2019-11-25 MED ORDER — SENNOSIDES-DOCUSATE SODIUM 8.6-50 MG PO TABS
2.0000 | ORAL_TABLET | ORAL | Status: DC
  Administered 2019-11-26: 2 via ORAL
  Filled 2019-11-25: qty 2

## 2019-11-25 MED ORDER — SIMETHICONE 80 MG PO CHEW: 80.0000 mg | CHEWABLE_TABLET | ORAL | Status: DC | PRN

## 2019-11-25 MED ORDER — DIBUCAINE (PERIANAL) 1 % EX OINT: 1.0000 "application " | TOPICAL_OINTMENT | CUTANEOUS | Status: DC | PRN

## 2019-11-25 NOTE — Progress Notes (Signed)
Patient ID: Jessica Lutz, female   DOB: 29-Dec-1985, 34 y.o.   MRN: [id number]  Mostly comfortable w epidural; no s/s pre-e; said foley cath was too uncomfortable earlier  BP 128/78, 129/72, 121/71 FHR 140s, +accels, no decels Ctx q 2-3 mins with Pit at 16mu/min Cx 7/80/vtx -1; AROM for copious clear fluid  IUP@37 .4wks Pre-e w/o SF +Covid Active labor  Hopeful that labor will increase with ROM Will try placing foley again now that epidural has set up more Anticipate vag del  [name] [name] 11/25/2019 5:40 AM

## 2019-11-25 NOTE — Progress Notes (Signed)
Labor Progress Note  Subjective:  Doing well, no complaints. Awaiting epidural   Objective:  BP 123/85   Pulse 91   Temp 97.7 F (36.5 C) (Oral)   Resp 18   Ht 5\' 6"  (1.676 m)   Wt 110.3 kg   LMP 03/07/2019 (Exact Date)   SpO2 97%   BMI 39.24 kg/m  Cervical: 8.5/90/0, vertex  Toco: q2-3 minutes FH: BL 145, moderate var, + a, none decels.   Assessment and Plan:  CAILYN HOUDEK is a 34 y.o. 32 at [redacted]w[redacted]d admitted for IOL for pre-e without severe features.   Labor:  Continue pitocin titration (now 10 mu/min, started at 1630). Anticipate AROM once epidural placed.  . Pain control: plans for epidural . Anticipated MOD: NSVD  Fetal Wellbeing: Category I . GBS POSITIVE/-- (09/29 02-05-1996), PCN  . Continuous fetal monitoring  Pre-eclampsia without severe features  BP ranges in last 24 hours: (106-140)/(62-96) 123/85 (10/16 0231). Denies any headaches, visual changes. PEC labs wnl. UP:C 0.48.   Continue to monitor   08-10-2002, M.D.  FM PGY 3

## 2019-11-25 NOTE — Anesthesia Postprocedure Evaluation (Signed)
Anesthesia Post Note  Patient: Jessica Lutz  Procedure(s) Performed: AN AD HOC LABOR EPIDURAL     Patient location during evaluation: Mother Baby Anesthesia Type: Epidural Level of consciousness: awake and alert, oriented and patient cooperative Pain management: pain level controlled Vital Signs Assessment: post-procedure vital signs reviewed and stable Respiratory status: spontaneous breathing Cardiovascular status: stable Postop Assessment: no headache, epidural receding, patient able to bend at knees and no signs of nausea or vomiting Anesthetic complications: no Comments: Pt. States she is walking.  Pain score 0.     No complications documented.  Last Vitals:  Vitals:   11/25/19 1125 11/25/19 1417  BP: 121/83 (!) 141/88  Pulse: (!) 105 88  Resp: 18 18  Temp:    SpO2:      Last Pain:  Vitals:   11/25/19 1417  TempSrc:   PainSc: 0-No pain   Pain Goal:                Epidural/Spinal Function Patient able to flex knees: Yes (11/25/19 1417), Patient able to lift hips off bed: Yes (11/25/19 1417), Back pain beyond tenderness at insertion site: No (11/25/19 1417), Progressively worsening motor and/or sensory loss: No (11/25/19 1417), Bowel and/or bladder incontinence post epidural: No (11/25/19 1417)  Merrilyn Puma

## 2019-11-25 NOTE — Anesthesia Procedure Notes (Signed)
Epidural Patient location during procedure: OB Start time: 11/25/2019 3:04 AM End time: 11/25/2019 3:24 AM  Staffing Anesthesiologist: Trevor Iha, MD Performed: anesthesiologist   Preanesthetic Checklist Completed: patient identified, IV checked, site marked, risks and benefits discussed, surgical consent, monitors and equipment checked, pre-op evaluation and timeout performed  Epidural Patient position: sitting Prep: DuraPrep and site prepped and draped Patient monitoring: continuous pulse ox and blood pressure Approach: midline Location: L3-L4 Injection technique: LOR air  Needle:  Needle type: Tuohy  Needle gauge: 17 G Needle length: 9 cm and 9 Needle insertion depth: 8 cm Catheter type: closed end flexible Catheter size: 19 Gauge Catheter at skin depth: 14 cm Test dose: negative  Assessment Events: blood not aspirated, injection not painful, no injection resistance, no paresthesia and negative IV test  Additional Notes Patient identified. Risks/Benefits/Options discussed with patient including but not limited to bleeding, infection, nerve damage, paralysis, failed block, incomplete pain control, headache, blood pressure changes, nausea, vomiting, reactions to medication both or allergic, itching and postpartum back pain. Confirmed with bedside nurse the patient's most recent platelet count. Confirmed with patient that they are not currently taking any anticoagulation, have any bleeding history or any family history of bleeding disorders. Patient expressed understanding and wished to proceed. All questions were answered. Sterile technique was used throughout the entire procedure. Please see nursing notes for vital signs. Test dose was given through epidural needle and negative prior to continuing to dose epidural or start infusion. Warning signs of high block given to the patient including shortness of breath, tingling/numbness in hands, complete motor block, or any  concerning symptoms with instructions to call for help. Patient was given instructions on fall risk and not to get out of bed. All questions and concerns addressed with instructions to call with any issues.2  Attempt (S) . Patient tolerated procedure well.

## 2019-11-25 NOTE — Progress Notes (Signed)
Pt refused Foley Catheter. Pt stated it was too painful.

## 2019-11-25 NOTE — Progress Notes (Addendum)
Labor Progress Note  Subjective:  Comfortable with epidural   Objective:  BP 129/72   Pulse 88   Temp 97.7 F (36.5 C) (Oral)   Resp 18   Ht 5\' 6"  (1.676 m)   Wt 110.3 kg   LMP 03/07/2019 (Exact Date)   SpO2 97%   BMI 39.24 kg/m  Cervical: 8/90/ballotable  vertex  Toco: q2-3 minutes FH: BL 135, moderate var, + a, none decels.   Assessment and Plan:  Jessica Lutz is a 34 y.o. 32 at [redacted]w[redacted]d admitted for IOL for pre-e without severe features.   Labor:  Evaluated for AROM. On cervical exam, infant head is ballotable posteriorly. Will hold on AROM at this time and continue with pitocin. Patient agreeable.  . Pain control: Epidural placed  . Anticipated MOD: NSVD  Fetal Wellbeing: Category I . GBS POSITIVE/-- (09/29 02-05-1996), PCN  . Continuous fetal monitoring  Pre-eclampsia without severe features  BP ranges in last 24 hours: 129/72. Denies any headaches, visual changes. PEC labs wnl. UP:C 0.48.   Continue to monitor   4037, M.D.  FM PGY 3

## 2019-11-25 NOTE — Discharge Instructions (Signed)

## 2019-11-25 NOTE — Anesthesia Preprocedure Evaluation (Addendum)
Anesthesia Evaluation  Patient identified by MRN, date of birth, ID band Patient awake    Reviewed: Allergy & Precautions, NPO status , Patient's Chart, lab work & pertinent test results  Airway Mallampati: II  TM Distance: >3 FB Neck ROM: Full    Dental no notable dental hx. (+) Teeth Intact, Dental Advisory Given   Pulmonary  COVID +   Pulmonary exam normal breath sounds clear to auscultation       Cardiovascular hypertension, Normal cardiovascular exam Rhythm:Regular Rate:Normal  prE   Neuro/Psych negative neurological ROS  negative psych ROS   GI/Hepatic negative GI ROS, Neg liver ROS,   Endo/Other  negative endocrine ROS  Renal/GU negative Renal ROS     Musculoskeletal negative musculoskeletal ROS (+)   Abdominal (+) + obese,   Peds  Hematology hgb 11.1 Plt 214   Anesthesia Other Findings   Reproductive/Obstetrics (+) Pregnancy                            Anesthesia Physical Anesthesia Plan  ASA: III  Anesthesia Plan: Epidural   Post-op Pain Management:    Induction:   PONV Risk Score and Plan:   Airway Management Planned:   Additional Equipment:   Intra-op Plan:   Post-operative Plan:   Informed Consent: I have reviewed the patients History and Physical, chart, labs and discussed the procedure including the risks, benefits and alternatives for the proposed anesthesia with the patient or authorized representative who has indicated his/her understanding and acceptance.       Plan Discussed with:   Anesthesia Plan Comments: (37.4 wk G4P2 w Pre E  Covid + for LEA)       Anesthesia Quick Evaluation

## 2019-11-26 MED ORDER — IBUPROFEN 600 MG PO TABS
600.0000 mg | ORAL_TABLET | Freq: Four times a day (QID) | ORAL | 0 refills | Status: DC
Start: 2019-11-26 — End: 2020-08-26

## 2019-11-26 NOTE — Clinical Social Work Maternal (Signed)
CLINICAL SOCIAL WORK MATERNAL/CHILD NOTE  Patient Details  Name: Jessica Lutz MRN: [id number] Date of Birth: 12/27/1985  Date:  11/26/2019  Clinical Social Worker Initiating Note:  Jamee Keach, MSW, LCSW Date/Time: Initiated:  11/26/19/1215     Child's Name:  Jessica Lutz   Biological Parents:  Mother, Father (FOB, Brandon Moody, 07/14/1884)   Need for Interpreter:  None   Reason for Referral:  Current Substance Use/Substance Use During Pregnancy    Address:  [address] [place] [address] (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   Education:  Some College   Homebound arranged:    Financial Resources:  Medicaid   Other Resources:  WIC, Food Stamps    Cultural/Religious Considerations Which May Impact Care:  none stated  Strengths:  Ability to meet basic needs , Home prepared for child , Pediatrician chosen   Psychotropic Medications:         Pediatrician:    [place] area  Pediatrician List:   [facility]  [place]    [place]    [place]    [place]    [place]      Pediatrician Fax Number:    Risk Factors/Current Problems:  None   Cognitive State:  Able to Concentrate , Alert , Linear Thinking    Mood/Affect:  Calm , Happy , Interested    CSW Assessment:  CSW received consult  for hx of THC use during pregnancy.  CSW met with MOB via telephone due to COVID precautions to offer support and complete assessment. MOB agreed to met in this manner and verified identity via 2 identifiers. MOB was pleasant and engaged during this call.    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 MOB denied any other illegal substance use and reported last use as "over two months ago". MOB declined treatment resources. CSW informed MOB of hospital infant drug screen policy, infant's negative UDS results, pending CDS results, and CPS report if warranted. MOB stated understanding and denied any questions. MOB denied any h/o CPS involvement regarding other child. MOB reports only having one child Zahmaria Bryant 04/13/2010, who MOB reports remain in her home/custody.    CSW provided education regarding the baby blues period vs. perinatal mood disorders, discussed treatment and gave resources for mental health follow up if concerns arise.  CSW recommends self-evaluation during the postpartum time period using the New Mom Checklist from Postpartum Progress and encouraged MOB to contact a medical professional if symptoms are noted at any time. MOB reported understanding and denied any questions. MOB denied any h/o BH dx or concerns. MOB denied any SI, HI,   or domestic violence. MOB reported current mood as "happy and ready to go home". MOB identified mom, FOB, and friends as support.     CSW provided review of Sudden Infant Death Syndrome (SIDS) precautions. MOB stated understandings and denied any questions. MOB confirmed having all needed items for baby including car seat and bassinet for baby's safe sleep.     CSW Plan/Description:  No Further Intervention Required/No Barriers to Discharge, Sudden Infant Death Syndrome (SIDS) Education, Perinatal Mood and Anxiety Disorder (PMADs) Education, Hospital Drug Screen Policy Information, CSW Will Continue to Monitor Umbilical Cord Tissue  Drug Screen Results and Make Report if Warranted    Lawernce Earll D. Keeon Zurn, MSW, LCSW Clinical Social Worker [phone] 11/26/2019, 1:11 PM 

## 2019-11-26 NOTE — Discharge Summary (Signed)
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 yo [id number] at [redacted]w[redacted]d was admitted to the hospital 11/24/2019 for induction of labor.  Indication for induction: Preeclampsia.  Patient had an uncomplicated labor course as follows: Membrane Rupture Time/Date: 5:25 AM ,11/25/2019   Delivery Method:Vaginal, Spontaneous  Episiotomy: None  Lacerations:  None  Details of delivery can be found in separate delivery note.  Patient had a routine postpartum course. Patient is discharged home 11/26/19.  Newborn Data: Birth date:11/25/2019  Birth time:8:01 AM  Gender:Female  Living status:Living  Apgars:9 ,9  Weight:3285 g   Magnesium Sulfate received: No BMZ received: No Rhophylac:N/A MMR:N/A T-DaP:Given prenatally Flu: No Transfusion:No  Of note, patient was covid +, asymptomatic. She declined monoclonal antibody therapy.   Physical exam  Vitals:   11/25/19 1813 11/25/19 2225 11/26/19 0206 11/26/19 0613  BP: 130/86 126/83 125/87 129/83  Pulse: (!) 110 85 86 82  Resp: $Remo'16 16 17 18  'WndUg$ Temp: 99.8 F (37.7 C) 97.7 F (36.5 C) 98.2 F (36.8 C) 97.8 F (36.6 C)  TempSrc:   Oral Oral  SpO2: 99% 100% 99% 98%  Weight:       Height:       General: alert, cooperative and no distress Lochia: appropriate Uterine Fundus: firm Incision: N/A DVT Evaluation: No evidence of DVT seen on physical exam. Labs: Lab Results  Component Value Date   WBC 8.0 11/25/2019   HGB 11.1 (L) 11/25/2019   HCT 34.8 (L) 11/25/2019   MCV 83.1 11/25/2019   PLT 214 11/25/2019   CMP Latest Ref Rng & Units 11/24/2019  Glucose 70 - 99 mg/dL 110(H)  BUN 6 - 20 mg/dL 7  Creatinine 0.44 - 1.00 mg/dL 0.75  Sodium 135 - 145 mmol/L 135  Potassium 3.5 - 5.1 mmol/L 3.7  Chloride 98 - 111 mmol/L 107  CO2 22 - 32 mmol/L 18(L)  Calcium 8.9 - 10.3 mg/dL 10.3  Total Protein 6.5 - 8.1 g/dL 6.4(L)  Total Bilirubin 0.3 - 1.2 mg/dL 0.2(L)  Alkaline Phos 38 - 126 U/L 121  AST 15 - 41 U/L 17  ALT 0 - 44 U/L 14   Edinburgh Score: Edinburgh Postnatal Depression Scale Screening Tool 11/26/2019  I have been able to laugh and see the funny side of things. 0  I have looked forward with enjoyment to things. 0  I have blamed myself unnecessarily when things went wrong. 0  I have been anxious or worried for no good reason. 0  I have felt scared or panicky for no good reason. 0  Things have been getting on  top of me. 0  I have been so unhappy that I have had difficulty sleeping. 0  I have felt sad or miserable. 0  I have been so unhappy that I have been crying. 0  The thought of harming myself has occurred to me. 0  Edinburgh Postnatal Depression Scale Total 0     After visit meds:  Allergies as of 11/26/2019   No Known Allergies     Medication List    TAKE these medications   acetaminophen 325 MG tablet Commonly known as: TYLENOL Take 2 tablets (650 mg total) by mouth every 6 (six) hours as needed for mild pain or moderate pain (mild discomfortORtemperature>/= 100.4 F).   aspirin EC 81 MG tablet Take 1 tablet (81 mg total) by mouth daily.   Blood Pressure Monitoring Devi 1 Device by Does not apply route once a week.    ibuprofen 600 MG tablet Commonly known as: ADVIL Take 1 tablet (600 mg total) by mouth every 6 (six) hours.   prenatal vitamin w/FE, FA 27-1 MG Tabs tablet Take 1 tablet by mouth daily at 12 noon.   terconazole 0.4 % vaginal cream Commonly known as: TERAZOL 7 Place 1 applicator vaginally at bedtime.        Discharge home in stable condition Infant Feeding: Bottle and Breast Infant Disposition:home with mother Discharge instruction: per After Visit Summary and Postpartum booklet. Activity: Advance as tolerated. Pelvic rest for 6 weeks.  Diet: routine diet Future Appointments: Future Appointments  Date Time Provider Ham[place]  11/30/2019  9:15 AM Starr Lake, [name] [facility]  12/06/2019  3:35 PM Virginia Rochester, NP [facility]  12/13/2019  3:35 PM Tresea Mall, [name] [facility]   Follow up Visit:  [facility]. Schedule an appointment as soon as possible for a visit in 4 week(s).   Specialty: Obstetrics and Gynecology Why: You will be scheduled for a blood pressure check in 1 week, and then a 4 week visit for your postpartum appointment Contact information: [address] [place] [address] [phone]               Please schedule this patient for a In person postpartum visit in 6 weeks with the following provider: Any provider. Additional Postpartum F/U:BP check 1 week Recommend VIRTUAL as pt covid positive  High risk pregnancy complicated by: PEC w/o SF, Covid positive  Delivery mode:  Vaginal, Spontaneous  Anticipated Birth Control:  Nexplanon as outpatient    11/26/2019 Janet Berlin, MD

## 2019-11-27 ENCOUNTER — Telehealth: Payer: Self-pay | Admitting: *Deleted

## 2019-11-27 NOTE — Telephone Encounter (Signed)
Transition Care Management Follow-up Telephone Call  Date of discharge and from where: 11/26/19 [facility]   How have you been since you were released from the hospital? "doing well, baby is eating good."  Any questions or concerns? No  Items Reviewed:  Did the pt receive and understand the discharge instructions provided? Yes   Medications obtained and verified? Yes   Other? No   Any new allergies since your discharge? No   Dietary orders reviewed? Yes  Do you have support at home? Yes   Home Care and Equipment/Supplies: Were home health services ordered? not applicable If so, what is the name of the agency? N/A  Has the agency set up a time to come to the patient's home? not applicable Were any new equipment or medical supplies ordered?  No What is the name of the medical supply agency? N/A Were you able to get the supplies/equipment? not applicable Do you have any questions related to the use of the equipment or supplies? No  Functional Questionnaire: (I = Independent and D = Dependent) ADLs: I  Bathing/Dressing- I  Meal Prep- I  Eating- I  Maintaining continence- I  Transferring/Ambulation- I  Managing Meds- I  Follow up appointments reviewed:   PCP Hospital f/u appt confirmed? No  - PEC Team to address  Specialist Hospital f/u appt confirmed? No    Are transportation arrangements needed? No   If their condition worsens, is the pt aware to call PCP or go to the Emergency Dept.? Yes  Was the patient provided with contact information for the PCP's office or ED? Yes  Was to pt encouraged to call back with questions or concerns? Yes

## 2019-11-28 ENCOUNTER — Telehealth: Payer: Self-pay | Admitting: *Deleted

## 2019-11-28 NOTE — Telephone Encounter (Signed)
Called patient to schedule follow up appointment ,she was at work and unable to talk. Jessica Lutz states she will call back later today for assistance . Jessica Lutz  PEC [phone]

## 2019-11-30 ENCOUNTER — Encounter: Payer: Self-pay | Admitting: Student

## 2019-12-04 ENCOUNTER — Telehealth (INDEPENDENT_AMBULATORY_CARE_PROVIDER_SITE_OTHER): Payer: Medicaid Other | Admitting: *Deleted

## 2019-12-04 DIAGNOSIS — Z0130 Encounter for examination of blood pressure without abnormal findings: Secondary | ICD-10-CM

## 2019-12-04 NOTE — Progress Notes (Signed)
Patient was assessed and managed by nursing staff during this encounter. I have reviewed the chart and agree with the documentation and plan. I have also made any necessary editorial changes.  Warden Fillers, MD 12/04/2019 3:45 PM

## 2019-12-04 NOTE — Progress Notes (Signed)
I connected with  Jessica Lutz on 12/04/19 at 10:20 AM EDT by Mychart video visit and verified that I am speaking with the correct person using two identifiers.   I discussed the limitations, risks, security and privacy concerns of performing an evaluation and management service by telephone and the availability of in person appointments. I also discussed with the patient that there may be a patient responsible charge related to this service. The patient expressed understanding and agreed to proceed.  Per chart review of history, pt had IOL d/t mild pre-eclampsia and delivered vaginally on 11/25/19. She was not prescribed anti-hypertensive medication @ discharge. Pt states she has not checked her BP @ home since leaving the hospital because her machine is not accurate and "always reads high". She denies presence of H/A or visual disturbances. Pt was asked to check BP with her cuff during this video visit and complied. BP - 138/93, P - 88. Pt was advised that we will need to re-check BP in office. She agreed to nurse visit appt tomorrow @ 2pm.   Armas Mcbee, Drucilla Schmidt, RN 12/04/2019  10:52 AM

## 2019-12-05 ENCOUNTER — Ambulatory Visit: Payer: Medicaid Other

## 2019-12-06 ENCOUNTER — Other Ambulatory Visit: Payer: Self-pay

## 2019-12-06 ENCOUNTER — Encounter: Payer: Self-pay | Admitting: *Deleted

## 2019-12-06 ENCOUNTER — Ambulatory Visit (INDEPENDENT_AMBULATORY_CARE_PROVIDER_SITE_OTHER): Payer: Medicaid Other | Admitting: *Deleted

## 2019-12-06 ENCOUNTER — Encounter: Payer: Self-pay | Admitting: Nurse Practitioner

## 2019-12-06 VITALS — BP 127/79 | HR 61 | Ht 66.0 in | Wt 228.7 lb

## 2019-12-06 DIAGNOSIS — Z0130 Encounter for examination of blood pressure without abnormal findings: Secondary | ICD-10-CM

## 2019-12-06 NOTE — Progress Notes (Signed)
On 10/25, pt had video visit with elevated BP and stated that her machine is not accurate and always gives high readings. I requested her to come to office for BP check. She is s/p vaginal delivery on 10/16 and is not taking anti-hypertensive medication. She denies having H/A or visual disturbances. BP today = 127/79, P - 61.  Pt was advised to keep appt as scheduled on 11/22 for PP exam and Nexplanon insertion. She agreed and voiced understanding.

## 2019-12-06 NOTE — Progress Notes (Signed)
I have reviewed this chart and agree with the RN/CMA assessment and management.    K. Meryl Stiven Kaspar, M.D. Attending [facility])   

## 2019-12-13 ENCOUNTER — Encounter: Payer: Self-pay | Admitting: Advanced Practice Midwife

## 2020-01-01 ENCOUNTER — Encounter: Payer: Self-pay | Admitting: Certified Nurse Midwife

## 2020-01-01 ENCOUNTER — Other Ambulatory Visit: Payer: Self-pay

## 2020-01-01 ENCOUNTER — Ambulatory Visit (INDEPENDENT_AMBULATORY_CARE_PROVIDER_SITE_OTHER): Payer: Medicaid Other | Admitting: Certified Nurse Midwife

## 2020-01-01 DIAGNOSIS — Z30011 Encounter for initial prescription of contraceptive pills: Secondary | ICD-10-CM

## 2020-01-01 DIAGNOSIS — Z3009 Encounter for other general counseling and advice on contraception: Secondary | ICD-10-CM

## 2020-01-01 MED ORDER — NORGESTIMATE-ETH ESTRADIOL 0.25-35 MG-MCG PO TABS: 1.0000 | ORAL_TABLET | Freq: Every day | ORAL | 3 refills | Status: DC

## 2020-01-01 NOTE — Progress Notes (Signed)
Post Partum Visit Note  Jessica Lutz is a 34 y.o. [phone] female who presents for a postpartum visit. She is 5 weeks postpartum following a normal spontaneous vaginal delivery.  I have fully reviewed the prenatal and intrapartum course. The delivery was at 37/4 gestational weeks.  Anesthesia: epidural. Postpartum course has been uncomplicated. Baby is doing well. Baby is feeding by bottle - Carn[employer] Good Start. Patient reports that her cycle started last Monday, is currently still on cycle. Bowel function is normal. Bladder function is normal. Patient is not sexually active.Postpartum depression screening: negative.   The pregnancy intention screening data noted above was reviewed. Potential methods of contraception were discussed. The patient elected to proceed with Oral Contraceptive.    Edinburgh Postnatal Depression Scale - 01/01/20 1612      Edinburgh Postnatal Depression Scale:  In the Past 7 Days   I have been able to laugh and see the funny side of things. 0    I have looked forward with enjoyment to things. 0    I have blamed myself unnecessarily when things went wrong. 0    I have been anxious or worried for no good reason. 0    I have felt scared or panicky for no good reason. 0    Things have been getting on top of me. 2    I have been so unhappy that I have had difficulty sleeping. 0    I have felt sad or miserable. 0    I have been so unhappy that I have been crying. 0    The thought of harming myself has occurred to me. 0    Edinburgh Postnatal Depression Scale Total 2            The following portions of the patient's history were reviewed and updated as appropriate: allergies, current medications, past medical history and problem list.  Review of Systems Pertinent items noted in HPI and remainder of comprehensive ROS otherwise negative.    Objective:  BP 139/85   Pulse 65   LMP 03/07/2019 (Exact Date)    General:  alert, cooperative and no distress    Breasts:  negative  Lungs: clear to auscultation bilaterally  Heart:  regular rate and rhythm  Abdomen: soft, non-tender; bowel sounds normal; no masses,  no organomegaly   Vulva:  not evaluated  Vagina: not evaluated  Cervix:  not evaluated  Corpus: not examined  Adnexa:  not evaluated  Rectal Exam: Not performed.        Assessment:   1. Postpartum care and examination  Normal postpartum exam. Pap smear not done at today's visit.   2. Birth control counseling Educated and discussed birth control options in detail with patient, patient desires to go back on OCPs as she was in the past "years ago"  3. Encounter for initial prescription of contraceptive pills - norgestimate-ethinyl estradiol (ORTHO-CYCLEN) 0.25-35 MG-MCG tablet; Take 1 tablet by mouth daily.  Dispense: 90 tablet; Refill: 3   Plan:   Essential components of care per ACOG recommendations:  1.  Mood and well being: Patient with negative depression screening today. Reviewed local resources for support.  - Patient does not use tobacco.  - hx of drug use? No  2. Infant care and feeding:  -Patient currently breastmilk feeding? No -Social determinants of health (SDOH) reviewed in EPIC. No concerns  3. Sexuality, contraception and birth spacing - Patient does not want a pregnancy in the next year.  Desired family  size is 3 children.  - Reviewed forms of contraception in tiered fashion. Patient desired oral contraceptives (estrogen/progesterone) today.   - Discussed birth spacing of 18 months  4. Sleep and fatigue -Encouraged family/partner/community support of 4 hrs of uninterrupted sleep to help with mood and fatigue  5. Physical Recovery  - Discussed patients delivery and complications - Patient has urinary incontinence? No - Patient is safe to resume physical and sexual activity. Encouraged to use back up birth control for 1 week after initiation of OCPs  6.  Health Maintenance - Last pap smear done 05/2019  and was normal with negative HPV.   Sharyon Cable, [name] [facility] [employer], [facility]

## 2020-01-01 NOTE — Progress Notes (Signed)
Pt wants BC Pills. 

## 2020-05-06 ENCOUNTER — Telehealth: Payer: Self-pay | Admitting: *Deleted

## 2020-05-06 ENCOUNTER — Telehealth: Payer: Medicaid Other | Admitting: Physician Assistant

## 2020-05-06 DIAGNOSIS — N921 Excessive and frequent menstruation with irregular cycle: Secondary | ICD-10-CM

## 2020-05-06 NOTE — Telephone Encounter (Signed)
Left message for pt to return call or send Mychart message.

## 2020-05-06 NOTE — Progress Notes (Signed)
Based on what you shared with me, I feel your condition warrants further evaluation and I recommend that you be seen for a face to face office visit. Prolonged menstrual bleeding is not something we handle via e-visit or video on demand. You need to contact your PCP or GYN. If you do not have either of those, you need evaluation at nearest urgent care. Please see below for a list of options. Please do not delay care.    NOTE: If you entered your credit card information for this eVisit, you will not be charged. You may see a "hold" on your card for the $35 but that hold will drop off and you will not have a charge processed.   If you are having a true medical emergency please call 911.      For an urgent face to face visit, [facility] has five urgent care centers for your convenience:     [facility] Center at [facility] Directions [phone] [address] [place], [place] [address] . 10 am - 6pm Monday - Friday    [facility] Health Urgent Care Center [facility]) Get Driving Directions [phone] [address] [place], [place] [address] . 10 am to 8 pm Monday-Friday . 12 pm to 8 pm [facility] Urgent Care at [facility] Get Driving Directions [phone] [address], [address] [place], [place] [address] . 8 am to 8 pm Monday-Friday . 9 am to 6 pm Saturday . 11 am to 6 pm Sunday     [facility] Health Urgent Care at [facility] Get Driving Directions  [phone] [address] [place], [place] [address] . 8 am to 8 pm Monday-Friday . 8 am to 4 pm [facility] Urgent Care at [facility] Directions [phone] [address] [place], [place] [address] . 12 pm to 6 pm Monday-Friday      Your e-visit answers were reviewed by a board certified advanced clinical practitioner to complete your personal care plan.  Thank you for using e-Visits.

## 2020-05-06 NOTE — Telephone Encounter (Signed)
Pt left VM message stating that she has been bleeding x1 month and requests medication for problem

## 2020-05-07 NOTE — Telephone Encounter (Signed)
Spoke with pt. Pt states having irregular cycles again since stopping OCPs in Jan. Pt had baby in Oct 2021. Pt was placed on OCPs for pregnancy prevention and cycle regulation. Pt states stopped taking OCPs in Jan 2022 due to not regulating cycles.  Pt states had normal cycle in Feb and started March 3/9 for normal 5 day cycle with no cramping, clots or feeling weak or tired. Pt states started light vaginal bleeding again on 3/16 to present. Pt states only changing a pad or tampon 2-3 times a day. Denies any heavy bleeding or clots since restarting cycle. Pt states had hx of irregular cycles before pregnancy.   Pt advised to have appt with provider to discuss and for exam. Pt states has made appt on 4/18 with Kooistra, [name] to discuss irregular cycles and possible new birth control method. Pt aware of appt and agreeable to date and time of appt.   Judeth Cornfield, RN  05/07/20. Routing to K. Crisoforo Oxford, [name] for review Encounter closed.

## 2020-05-27 ENCOUNTER — Other Ambulatory Visit: Payer: Self-pay

## 2020-05-27 ENCOUNTER — Ambulatory Visit (INDEPENDENT_AMBULATORY_CARE_PROVIDER_SITE_OTHER): Payer: Medicaid Other | Admitting: Student

## 2020-05-27 ENCOUNTER — Encounter: Payer: Self-pay | Admitting: Student

## 2020-05-27 VITALS — BP 112/83 | HR 109 | Wt 236.2 lb

## 2020-05-27 DIAGNOSIS — N938 Other specified abnormal uterine and vaginal bleeding: Secondary | ICD-10-CM

## 2020-05-27 DIAGNOSIS — R791 Abnormal coagulation profile: Secondary | ICD-10-CM | POA: Diagnosis not present

## 2020-05-27 MED ORDER — MEDROXYPROGESTERONE ACETATE 10 MG PO TABS: 10.0000 mg | ORAL_TABLET | Freq: Every day | ORAL | 2 refills | Status: DC

## 2020-05-27 NOTE — Progress Notes (Signed)
  History:  Jessica Lutz is a 35 y.o. [id number] who presents to clinic today for prolonged vaginal bleeding that started on March 9. It is sometimes dark, sometimes light. It is no heavy, but it varies in color. She is not passing clots, she does not have any pain. She has been having regular periods for a while, but then in March it changed. Her cycle was normal in January and February; it usually lasts 5-6 days. She denies pain or clots with her regular cycle. Her last baby was born November 25, 2019.  She denies burning with urination, abnormal vaginal discharge, has not had intercourse since February.  She denies PCOS, DM, but she has a family history. She wants to be examined for this.   The following portions of the patient's history were reviewed and updated as appropriate: allergies, current medications, family history, past medical history, social history, past surgical history and problem list. She reports being told she had polyps in 2020 or 2021.   Review of Systems:  Review of Systems  Constitutional: Negative.   HENT: Negative.   Genitourinary: Negative.   Skin: Negative.   Neurological: Negative.   Psychiatric/Behavioral: Negative.       Objective:  Physical Exam BP 112/83   Pulse (!) 109   Wt 236 lb 3.2 oz (107.1 kg)   LMP 04/17/2020   Breastfeeding No   BMI 38.12 kg/m  Physical Exam Genitourinary:    Rectum: Guaiac result negative.  Musculoskeletal:        General: Normal range of motion.  Skin:    General: Skin is warm.  Neurological:     General: No focal deficit present.     Mental Status: She is alert.       Labs and Imaging No results found for this or any previous visit (from the past 24 hour(s)).  No results found.   Assessment & Plan:   1. Abnormal bleeding time   2. Dysfunctional uterine bleeding    -US pelvic ordered - patient does not want to take birth control, and also wants her bleeding to stop. Agreed to try provera until she  can get [place] and follow up with MD Approximately 20 minutes of total time was spent with this patient on counseling and care.  Marylene Land, [name] 05/27/2020 9:32 AM

## 2020-06-17 ENCOUNTER — Ambulatory Visit
Admission: RE | Admit: 2020-06-17 | Discharge: 2020-06-17 | Disposition: A | Discharge status: Home / Self Care | Payer: Medicaid Other | Source: Ambulatory Visit | Attending: Student | Admitting: Student

## 2020-06-17 ENCOUNTER — Other Ambulatory Visit: Payer: Self-pay | Admitting: Student

## 2020-06-17 ENCOUNTER — Other Ambulatory Visit: Payer: Self-pay

## 2020-06-17 DIAGNOSIS — R791 Abnormal coagulation profile: Secondary | ICD-10-CM | POA: Insufficient documentation

## 2020-06-17 DIAGNOSIS — N939 Abnormal uterine and vaginal bleeding, unspecified: Secondary | ICD-10-CM | POA: Diagnosis not present

## 2020-06-27 ENCOUNTER — Telehealth: Payer: Self-pay | Admitting: Student

## 2020-06-27 NOTE — Telephone Encounter (Signed)
Spoke with patient and confirmed identity with 2 verifiers. Explained [place] results and probably diagnosis of PCOS; patient does not want birth control but she is interested in metformin. However, she is not sure about it. She also states that her bleeding has stopped but she is not sure if it was the Provera or not.  Recommended that patient be seen in the office to talk about treatments for PCOS; I will send a message to Admin pool to schedule patient.   Luna Kitchens

## 2020-07-04 ENCOUNTER — Other Ambulatory Visit: Payer: Self-pay | Admitting: Pharmacist

## 2020-07-04 MED ORDER — MEDROXYPROGESTERONE ACETATE 10 MG PO TABS: 10.0000 mg | ORAL_TABLET | Freq: Every day | ORAL | 1 refills | Status: DC

## 2020-08-26 ENCOUNTER — Ambulatory Visit (INDEPENDENT_AMBULATORY_CARE_PROVIDER_SITE_OTHER): Payer: Medicaid Other | Admitting: Obstetrics and Gynecology

## 2020-08-26 ENCOUNTER — Other Ambulatory Visit: Payer: Self-pay

## 2020-08-26 DIAGNOSIS — Z331 Pregnant state, incidental: Secondary | ICD-10-CM

## 2020-08-26 DIAGNOSIS — Z3201 Encounter for pregnancy test, result positive: Secondary | ICD-10-CM

## 2020-08-26 DIAGNOSIS — E282 Polycystic ovarian syndrome: Secondary | ICD-10-CM | POA: Insufficient documentation

## 2020-08-26 LAB — POCT PREGNANCY, URINE: Preg Test, Ur: POSITIVE — AB

## 2020-08-26 NOTE — Progress Notes (Signed)
  CC: irregular bleeding Subjective:    Patient ID: Jessica Lutz, female    DOB: [birth year], 35 y.o.   MRN: [id number]  HPI 35 yo G4P2 seen for history of irregular bleeding.  Pt noted 1-2 months of amenorrhea.  Discussed etiology of PCOS along with u/s findings.  Also discussed treatment options including OCP for cycle control, 10% weight loss or metformin.  However, UPT at end of appointment was positive.  Pt is pregnant.   Review of Systems     Objective:   Physical Exam Vitals:   08/26/20 1407  BP: 108/77  Pulse: 74         Assessment & Plan:   1. Polycystic ovarian syndrome Pt may still have PCOS, but this can be more fully evaluated/treated after completion of pregnancy. - HgB A1c - POCT urine pregnancy  2. Pregnancy test positive for incidental pregnancy Pt advised to start PNV now, and to get new ob visit in 3-4 weeks.    Warden Fillers, MD Faculty Attending, Center for [facility]

## 2020-08-27 LAB — HEMOGLOBIN A1C
Est. average glucose Bld gHb Est-mCnc: 120 mg/dL
Hgb A1c MFr Bld: 5.8 % — ABNORMAL HIGH (ref 4.8–5.6)

## 2020-09-02 ENCOUNTER — Telehealth: Payer: Self-pay | Admitting: Family Medicine

## 2020-09-02 NOTE — Telephone Encounter (Signed)
Patient said she was told from this office that she tested positive for pregnancy, she is not sure when her last cycle was, she is requesting A [place]

## 2020-09-04 NOTE — Telephone Encounter (Signed)
Pt stated that she just found out she was pregnant and is unsure of LMP because she bleed for three months then stopped bleeding. Per chart review LMP was documented as around the 06/30/20.  I explained to the pt that once she starts OB care the provider at the time will determine if she needs the [place].  Pt states that is what she trying to do however the front office staff that one of [place] would call her.  Pt verbalized understanding.   Leonette Nutting 09/04/20

## 2020-09-18 ENCOUNTER — Telehealth: Payer: Medicaid Other

## 2020-09-19 ENCOUNTER — Telehealth (INDEPENDENT_AMBULATORY_CARE_PROVIDER_SITE_OTHER): Payer: Medicaid Other

## 2020-09-19 DIAGNOSIS — O0991 Supervision of high risk pregnancy, unspecified, first trimester: Secondary | ICD-10-CM

## 2020-09-19 DIAGNOSIS — Z3A00 Weeks of gestation of pregnancy not specified: Secondary | ICD-10-CM

## 2020-09-19 DIAGNOSIS — Z3490 Encounter for supervision of normal pregnancy, unspecified, unspecified trimester: Secondary | ICD-10-CM

## 2020-09-19 MED ORDER — PRENATAL PLUS/IRON 27-1 MG PO TABS: 1.0000 | ORAL_TABLET | Freq: Every day | ORAL | 11 refills | Status: DC

## 2020-09-19 MED ORDER — BLOOD PRESSURE KIT DEVI: 1.0000 | Freq: Once | 0 refills | Status: AC

## 2020-09-19 NOTE — Progress Notes (Signed)
New OB Intake  I connected with  Jessica Lutz on 09/20/20 at  3:15 PM EDT by MyChart Video Visit and verified that I am speaking with the correct person using two identifiers. Nurse is located at [facility] and pt is located at home.  I discussed the limitations, risks, security and privacy concerns of performing an evaluation and management service by telephone and the availability of in person appointments. I also discussed with the patient that there may be a patient responsible charge related to this service. The patient expressed understanding and agreed to proceed.  I explained I am completing New OB Intake today. We discussed her EDD of 04/06/20 that is based on LMP of 06/30/20. Pt is G5/P2022. Pt states this is not an accurate LMP. States she was bleeding for multiple months and this was just the day the bleeding changed. Dating [place] scheduled for 09/30/20. I reviewed her allergies, medications, Medical/Surgical/OB history, and appropriate screenings. I informed her of [facility] services. Based on history, this is an uncomplicated pregnancy.  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 Has tried Tylenol and Excedrin. Reports eating and drinking normally. Explained I will review with provider and reach back out to her.  Delivery Plans:  Plans to deliver at [facility].   MyChart/Babyscripts MyChart access verified. I explained pt will have some visits in office and some virtually. Babyscripts instructions given and order placed.   Blood Pressure Cuff  Blood pressure cuff ordered for patient to pick-up from [employer]. Explained after first prenatal appt pt will check weekly and document in  Babyscripts.  Weight scale: Patient    have weight scale. Weight scale ordered for patient to pick up form [facility].   Anatomy US Anatomy will be scheduled at new OB appt once dating is confirmed.  Labs Discussed Avelina Laine genetic screening with patient. Needs Panorama only.  COVID Vaccine Patient has not had COVID vaccine.   Mother/ Baby Dyad Candidate?    Not a candidate.  Social Determinants of Health Food Insecurity: Patient denies food insecurity. WIC Referral: Patient is currently enrolled for other children. Will notify WIC of new pregnancy. Transportation: Patient denies transportation needs. Childcare: Discussed no children allowed at ultrasound appointments. Offered childcare services; patient declines childcare services at this time.  First visit review I reviewed new OB appt with pt. I explained she will have a visit with provider that includes physical exam and OB labs. Explained pt will be seen by Warner Mccreedy, MD at first visit; encounter routed to appropriate provider. Explained that patient will be seen by pregnancy navigator following visit with provider.   Marjo Bicker, RN 09/20/2020  12:31 PM

## 2020-09-30 ENCOUNTER — Ambulatory Visit
Admission: RE | Admit: 2020-09-30 | Discharge: 2020-09-30 | Disposition: A | Discharge status: Home / Self Care | Payer: Medicaid Other | Source: Ambulatory Visit | Attending: Family Medicine | Admitting: Family Medicine

## 2020-09-30 ENCOUNTER — Other Ambulatory Visit: Payer: Self-pay | Admitting: Family Medicine

## 2020-09-30 ENCOUNTER — Other Ambulatory Visit: Payer: Self-pay

## 2020-09-30 DIAGNOSIS — O0991 Supervision of high risk pregnancy, unspecified, first trimester: Secondary | ICD-10-CM | POA: Diagnosis not present

## 2020-09-30 DIAGNOSIS — O26841 Uterine size-date discrepancy, first trimester: Secondary | ICD-10-CM | POA: Diagnosis not present

## 2020-09-30 DIAGNOSIS — Z3A12 12 weeks gestation of pregnancy: Secondary | ICD-10-CM | POA: Diagnosis not present

## 2020-10-04 ENCOUNTER — Encounter: Payer: Medicaid Other | Admitting: Family Medicine

## 2020-10-23 ENCOUNTER — Encounter: Payer: Self-pay | Admitting: Obstetrics and Gynecology

## 2020-10-23 ENCOUNTER — Other Ambulatory Visit (HOSPITAL_COMMUNITY)
Admission: RE | Admit: 2020-10-23 | Discharge: 2020-10-23 | Disposition: A | Discharge status: Home / Self Care | Payer: Medicaid Other | Source: Ambulatory Visit | Attending: Family Medicine | Admitting: Family Medicine

## 2020-10-23 ENCOUNTER — Other Ambulatory Visit: Payer: Self-pay

## 2020-10-23 ENCOUNTER — Ambulatory Visit (INDEPENDENT_AMBULATORY_CARE_PROVIDER_SITE_OTHER): Payer: Medicaid Other | Admitting: Obstetrics and Gynecology

## 2020-10-23 VITALS — BP 111/72 | HR 98 | Wt 233.9 lb

## 2020-10-23 DIAGNOSIS — Z6837 Body mass index (BMI) 37.0-37.9, adult: Secondary | ICD-10-CM | POA: Insufficient documentation

## 2020-10-23 DIAGNOSIS — Z8759 Personal history of other complications of pregnancy, childbirth and the puerperium: Secondary | ICD-10-CM | POA: Insufficient documentation

## 2020-10-23 DIAGNOSIS — O99210 Obesity complicating pregnancy, unspecified trimester: Secondary | ICD-10-CM

## 2020-10-23 DIAGNOSIS — Z3A15 15 weeks gestation of pregnancy: Secondary | ICD-10-CM

## 2020-10-23 DIAGNOSIS — O09899 Supervision of other high risk pregnancies, unspecified trimester: Secondary | ICD-10-CM

## 2020-10-23 DIAGNOSIS — O0992 Supervision of high risk pregnancy, unspecified, second trimester: Secondary | ICD-10-CM

## 2020-10-23 DIAGNOSIS — N898 Other specified noninflammatory disorders of vagina: Secondary | ICD-10-CM

## 2020-10-23 DIAGNOSIS — D563 Thalassemia minor: Secondary | ICD-10-CM

## 2020-10-23 DIAGNOSIS — Z3480 Encounter for supervision of other normal pregnancy, unspecified trimester: Secondary | ICD-10-CM | POA: Diagnosis not present

## 2020-10-23 DIAGNOSIS — E282 Polycystic ovarian syndrome: Secondary | ICD-10-CM

## 2020-10-23 DIAGNOSIS — O0932 Supervision of pregnancy with insufficient antenatal care, second trimester: Secondary | ICD-10-CM | POA: Insufficient documentation

## 2020-10-23 MED ORDER — ASPIRIN EC 81 MG PO TBEC: 81.0000 mg | DELAYED_RELEASE_TABLET | Freq: Every day | ORAL | 2 refills | Status: DC

## 2020-10-23 NOTE — Progress Notes (Signed)
New OB Note  10/23/2020   Clinic: [facility]  Chief Complaint: NOB  Transfer of Care Patient: no  History of Present Illness: Jessica Lutz is a 35 y.o. [id number] @ 15/3 weeks (EDC 3/5, based on 12wk u/s) Patient's last menstrual period was 06/30/2020 (within days).).  Preg complicated by has Obesity in pregnancy; Alpha thalassemia silent carrier; Preterm labor in third trimester; Supervision of high-risk pregnancy; Polycystic ovarian syndrome; History of pre-eclampsia; Short interval between pregnancies affecting pregnancy, antepartum; BMI 37.0-37.9, adult; and Late prenatal care affecting pregnancy in second trimester on their problem list.  Any events prior to today's visit: no Her periods were: irregular She was using no method when she conceived.  She has Negative signs or symptoms of nausea/vomiting of pregnancy. She has Negative signs or symptoms of miscarriage or preterm labor  ROS: A 12-point review of systems was performed and negative, except as stated in the above HPI.  OBGYN History: As per HPI. OB History  Gravida Para Term Preterm AB Living  5 2 2  0 2 2  SAB IAB Ectopic Multiple Live Births  2 0 0 0 2    # Outcome Date GA Lbr Len/2nd Weight Sex Delivery Anes PTL Lv  5 Current           4 Term 11/25/19 [redacted]w[redacted]d 411:26 / 00:35 7 lb 3.9 oz (3.285 kg) F Vag-Spont EPI  LIV  3 SAB 03/30/13    U         Birth Comments: System Generated. Please review and update pregnancy details.  2 SAB 01/09/11    U      1 Term 04/13/10 [redacted]w[redacted]d  8 lb 3 oz (3.714 kg) F Vag-Vacuum   LIV     Birth Comments: System Generated. Please review and update pregnancy details. wnl    Any issues with any prior pregnancies: mild pre-eclampsia with last one Prior children are healthy, doing well, and without any problems or issues: yes History of pap smears: Yes. Last pap smear 2021 and results were negative cytology and HPV   Past Medical History: Past Medical History:   Diagnosis Date   Anemia 03/22/2018   Obesity affecting pregnancy 05/29/2019   Recommendations [x]  Aspirin 81 mg daily after 12 weeks; discontinue after 36 weeks [ ]  Nutrition consult [ ]  Weight gain 11-20 lbs for singleton and 25-35 lbs for twin pregnancy (IOM guidelines) Higher class of obesity patients recommended to gain closer to lower limit  Weight loss is associated with adverse outcomes [ ]  Screen for DM with A1C or early 2 hr GTT [ ]  Baseline and surveillance labs (   Positive GBS test 11/09/2019   Pre-eclampsia, mild 11/25/2019   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   Pseudodeficiency variant detected for Tay-Sachs Not a variant of concern Genetic Counseling: on 07/20/2019 [] partner testing   Supervision of low-risk pregnancy 05/29/2019    Nursing Staff Provider Office Location  [facility] Dating  LMP Language   English Anatomy  Normal - incomplete - FU 7/7 - normal Flu Vaccine  Declined-06/01/19 Genetic Screen  NIPS: LR  AFP:  Not performed   TDaP vaccine   09-13-19  Hgb A1C or  GTT Early 5.7 Third trimester WNL  Glucose, Fasting 65 - 91 mg/dL 88  Glucose, 1 hour 65 - 179 mg/dL 09/19/2019  Glucose, 2 hour 65 - 152 mg/dL   Rhogam  NA       Past Surgical History: Past Surgical History:  Procedure Laterality Date   NO PAST SURGERIES      Family History:  Family History  Problem Relation Age of Onset   Thyroid disease Mother    Alcohol abuse Neg Hx    Social History:  Social History   Socioeconomic History   Marital status: Single    Spouse name: Not on file   Number of children: Not on file   Years of education: Not on file   Highest education level: Not on file  Occupational History   Not on file  Tobacco Use   Smoking status: Never   Smokeless tobacco: Never  Vaping Use   Vaping Use: Never used  Substance and Sexual Activity   Alcohol use: Not Currently    Comment: occ   Drug use: Not Currently    Types: Marijuana    Comment: last used 3 wks ago  from 07/18/19   Sexual activity: Yes    Birth control/protection: None  Other Topics Concern   Not on file  Social History Narrative   Not on file   Social Determinants of Health   Financial Resource Strain: Not on file  Food Insecurity: No Food Insecurity   Worried About Running Out of Food in the Last Year: Never true   Ran Out of Food in the Last Year: Never true  Transportation Needs: No Transportation Needs   Lack of Transportation (Medical): No   Lack of Transportation (Non-Medical): No  Physical Activity: Not on file  Stress: Not on file  Social Connections: Not on file  Intimate Partner Violence: Not on file    Allergy: No Known Allergies  Health Maintenance:  Mammogram Up to Date: not applicable  Current Outpatient Medications: Prenatal vitamin  Physical Exam:   BP 111/72   Pulse 98   Wt 233 lb 14.4 oz (106.1 kg)   LMP 06/30/2020 (Within Days)   BMI 37.75 kg/m  Body mass index is 37.75 kg/m.   Vag. Bleeding: None. Fundal height: not applicable FHTs: 140s  General appearance: Well nourished, well developed female in no acute distress.  Neck:  Supple, normal appearance, and no thyromegaly  Cardiovascular: S1, S2 normal, no murmur, rub or gallop, regular rate and rhythm Respiratory:  Clear to auscultation bilateral. Normal respiratory effort Abdomen: positive bowel sounds and no masses, hernias; diffusely non tender to palpation, non distended Breasts: pt denies any breast s/s. Neuro/Psych:  Normal mood and affect.  Skin:  Warm and dry.  Lymphatic:  No inguinal lymphadenopathy.   Pelvic exam: is not limited by body habitus EGBUS: within normal limits, Vagina: within normal limits and with no blood in the vault, Cervix: normal appearing cervix without discharge or lesions, closed/long/high, Uterus:  nonenlarged, and Adnexa:  normal adnexa and no mass, fullness, tenderness  Laboratory: none  Imaging:  Narrative & Impression  CLINICAL DATA:  First  trimester pregnancy, dating, LMP uncertain may be 06/30/2020   EXAM: OBSTETRIC <14 WK ULTRASOUND   TECHNIQUE: Transabdominal ultrasound was performed for evaluation of the gestation as well as the maternal uterus and adnexal regions.   COMPARISON:  06/17/2020   FINDINGS: Intrauterine gestational sac: Present, single   Yolk sac:  Not identified   Embryo:  Present   Cardiac Activity: Present   Heart Rate: 157 bpm   CRL:   55.4 mm   12 w 1 d                  [place] EDC: 04/13/2021   Subchorionic hemorrhage:  None visualized.   Maternal uterus/adnexae:   RIGHT ovary normal size and morphology, 6.0 x 2.5 x 3.3 cm.   LEFT ovary normal size and morphology, 5.1 x 2.6 x 3.3 cm.   No free pelvic fluid or adnexal masses.   IMPRESSION: Single live intrauterine gestation at 12 weeks 1 day EGA by crown-rump length.   No acute abnormalities.     Electronically Signed   By: Ulyses Southward M.D.   On: 09/30/2020 15:49    Assessment: pt doing well  Plan: 1. [redacted] weeks gestation of pregnancy  2. Polycystic ovarian syndrome Early a1c  3. Supervision of high risk pregnancy in second trimester Anatomy u/s ordered. Pt for genetics today  4. Short interval between pregnancies affecting pregnancy, antepartum  5. Obesity in pregnancy  6. BMI 37.0-37.9, adult  7. History of pre-eclampsia Pt amenable to low dose asa. Baseline labs today  8. Alpha thalassemia silent carrier S/p GC last pregnancy  9. Late prenatal care affecting pregnancy in second trimester  Problem list reviewed and updated.  Follow up in 4 weeks.  >50% of 35 min visit spent on counseling and coordination of care.     Cornelia Copa MD Attending Center for [facility])

## 2020-10-24 LAB — CERVICOVAGINAL ANCILLARY ONLY
Bacterial Vaginitis (gardnerella): POSITIVE — AB
Candida Glabrata: NEGATIVE
Candida Vaginitis: NEGATIVE
Chlamydia: NEGATIVE
Comment: NEGATIVE
Comment: NEGATIVE
Comment: NEGATIVE
Comment: NEGATIVE
Comment: NEGATIVE
Comment: NORMAL
Neisseria Gonorrhea: NEGATIVE
Trichomonas: NEGATIVE

## 2020-10-24 LAB — PROTEIN / CREATININE RATIO, URINE
Creatinine, Urine: 184.7 mg/dL
Protein, Ur: 35.2 mg/dL
Protein/Creat Ratio: 191 mg/g creat (ref 0–200)

## 2020-10-25 LAB — CBC/D/PLT+RPR+RH+ABO+RUBIGG...
Antibody Screen: NEGATIVE
Basophils Absolute: 0 10*3/uL (ref 0.0–0.2)
Basos: 0 %
EOS (ABSOLUTE): 0.1 10*3/uL (ref 0.0–0.4)
Eos: 1 %
HCV Ab: <0.1 s/co ratio (ref 0.0–0.9)
HIV Screen 4th Generation wRfx: NONREACTIVE
Hematocrit: 34.5 % (ref 34.0–46.6)
Hemoglobin: 11.7 g/dL (ref 11.1–15.9)
Hepatitis B Surface Ag: NEGATIVE
Immature Grans (Abs): 0 10*3/uL (ref 0.0–0.1)
Immature Granulocytes: 0 %
Lymphocytes Absolute: 1.9 10*3/uL (ref 0.7–3.1)
Lymphs: 28 %
MCH: 26.9 pg (ref 26.6–33.0)
MCHC: 33.9 g/dL (ref 31.5–35.7)
MCV: 79 fL (ref 79–97)
Monocytes Absolute: 0.5 10*3/uL (ref 0.1–0.9)
Monocytes: 7 %
Neutrophils Absolute: 4.3 10*3/uL (ref 1.4–7.0)
Neutrophils: 64 %
Platelets: 277 10*3/uL (ref 150–450)
RBC: 4.35 x10E6/uL (ref 3.77–5.28)
RDW: 13.2 % (ref 11.7–15.4)
RPR Ser Ql: NONREACTIVE
Rh Factor: POSITIVE
Rubella Antibodies, IGG: 2.19 index (ref >0.99)
WBC: 6.8 10*3/uL (ref 3.4–10.8)

## 2020-10-25 LAB — AFP, SERUM, OPEN SPINA BIFIDA
AFP MoM: 1.13
AFP Value: 29.2 ng/mL
Gest. Age on Collection Date: 15 weeks
Maternal Age At EDD: 34.4 yr
OSBR Risk 1 IN: 10000
Test Results:: NEGATIVE
Weight: 235 [lb_av]

## 2020-10-25 LAB — COMPREHENSIVE METABOLIC PANEL
ALT: 7 IU/L (ref 0–32)
AST: 6 IU/L (ref 0–40)
Albumin/Globulin Ratio: 1.4 (ref 1.2–2.2)
Albumin: 3.8 g/dL (ref 3.8–4.8)
Alkaline Phosphatase: 85 IU/L (ref 44–121)
BUN/Creatinine Ratio: 10 (ref 9–23)
BUN: 6 mg/dL (ref 6–20)
Bilirubin Total: <0.2 mg/dL (ref 0.0–1.2)
CO2: 21 mmol/L (ref 20–29)
Calcium: 9.6 mg/dL (ref 8.7–10.2)
Chloride: 101 mmol/L (ref 96–106)
Creatinine, Ser: 0.63 mg/dL (ref 0.57–1.00)
Globulin, Total: 2.7 g/dL (ref 1.5–4.5)
Glucose: 96 mg/dL (ref 65–99)
Potassium: 4 mmol/L (ref 3.5–5.2)
Sodium: 134 mmol/L (ref 134–144)
Total Protein: 6.5 g/dL (ref 6.0–8.5)
eGFR: 120 mL/min/{1.73_m2} (ref >59)

## 2020-10-25 LAB — HEMOGLOBIN A1C
Est. average glucose Bld gHb Est-mCnc: 114 mg/dL
Hgb A1c MFr Bld: 5.6 % (ref 4.8–5.6)

## 2020-10-25 LAB — HCV INTERPRETATION

## 2020-10-25 LAB — URINE CULTURE, OB REFLEX

## 2020-10-25 LAB — CULTURE, OB URINE

## 2020-10-25 MED ORDER — METRONIDAZOLE 500 MG PO TABS: 500.0000 mg | ORAL_TABLET | Freq: Two times a day (BID) | ORAL | 0 refills | Status: AC

## 2020-10-25 NOTE — Addendum Note (Signed)
Addended by: [place] Bing on: 10/25/2020 01:35 PM   Modules accepted: Orders

## 2020-10-31 ENCOUNTER — Encounter: Payer: Self-pay | Admitting: General Practice

## 2020-11-13 ENCOUNTER — Ambulatory Visit (HOSPITAL_BASED_OUTPATIENT_CLINIC_OR_DEPARTMENT_OTHER): Payer: Medicaid Other | Admitting: Maternal & Fetal Medicine

## 2020-11-13 ENCOUNTER — Ambulatory Visit: Payer: Medicaid Other | Admitting: *Deleted

## 2020-11-13 ENCOUNTER — Other Ambulatory Visit: Payer: Self-pay

## 2020-11-13 ENCOUNTER — Ambulatory Visit (INDEPENDENT_AMBULATORY_CARE_PROVIDER_SITE_OTHER): Payer: Medicaid Other | Admitting: Obstetrics and Gynecology

## 2020-11-13 ENCOUNTER — Ambulatory Visit: Payer: Medicaid Other | Attending: Obstetrics and Gynecology

## 2020-11-13 ENCOUNTER — Encounter: Payer: Self-pay | Admitting: *Deleted

## 2020-11-13 ENCOUNTER — Encounter: Payer: Self-pay | Admitting: Family Medicine

## 2020-11-13 VITALS — BP 115/72 | HR 97 | Wt 231.1 lb

## 2020-11-13 VITALS — BP 113/67 | HR 93

## 2020-11-13 DIAGNOSIS — O99210 Obesity complicating pregnancy, unspecified trimester: Secondary | ICD-10-CM

## 2020-11-13 DIAGNOSIS — Z6837 Body mass index (BMI) 37.0-37.9, adult: Secondary | ICD-10-CM

## 2020-11-13 DIAGNOSIS — O0992 Supervision of high risk pregnancy, unspecified, second trimester: Secondary | ICD-10-CM | POA: Insufficient documentation

## 2020-11-13 DIAGNOSIS — O283 Abnormal ultrasonic finding on antenatal screening of mother: Secondary | ICD-10-CM | POA: Insufficient documentation

## 2020-11-13 DIAGNOSIS — Z3A18 18 weeks gestation of pregnancy: Secondary | ICD-10-CM | POA: Insufficient documentation

## 2020-11-13 DIAGNOSIS — Z8759 Personal history of other complications of pregnancy, childbirth and the puerperium: Secondary | ICD-10-CM

## 2020-11-13 DIAGNOSIS — D563 Thalassemia minor: Secondary | ICD-10-CM

## 2020-11-13 DIAGNOSIS — O09899 Supervision of other high risk pregnancies, unspecified trimester: Secondary | ICD-10-CM

## 2020-11-13 NOTE — Progress Notes (Signed)
   PRENATAL VISIT NOTE  Subjective:  Jessica Lutz is a 35 y.o. C6C3762 at [redacted]w[redacted]d being seen today for ongoing prenatal care.  She is currently monitored for the following issues for this high-risk pregnancy and has Obesity in pregnancy; Alpha thalassemia silent carrier; Preterm labor in third trimester; Supervision of high-risk pregnancy; Polycystic ovarian syndrome; History of pre-eclampsia; Short interval between pregnancies affecting pregnancy, antepartum; BMI 37.0-37.9, adult; Late prenatal care affecting pregnancy in second trimester; and [redacted] weeks gestation of pregnancy on their problem list.  Patient doing well with no acute concerns today. She reports no complaints.  Contractions: Not present. Vag. Bleeding: None.  Movement: Present. Denies leaking of fluid.   Discussed need for preventative baby ASA due to hx of mild pre-E and risk factors.  Pt notes she will try to restart medication.  The following portions of the patient's history were reviewed and updated as appropriate: allergies, current medications, past family history, past medical history, past social history, past surgical history and problem list. Problem list updated.  Objective:   Vitals:   11/13/20 0931  BP: 115/72  Pulse: 97  Weight: 231 lb 1.6 oz (104.8 kg)    Fetal Status: Fetal Heart Rate (bpm): 146   Movement: Present     General:  Alert, oriented and cooperative. Patient is in no acute distress.  Skin: Skin is warm and dry. No rash noted.   Cardiovascular: Normal heart rate noted  Respiratory: Normal respiratory effort, no problems with respiration noted  Abdomen: Soft, gravid, appropriate for gestational age.  Pain/Pressure: Present     Pelvic: Cervical exam deferred        Extremities: Normal range of motion.  Edema: None  Mental Status:  Normal mood and affect. Normal behavior. Normal judgment and thought content.   Assessment and Plan:  Pregnancy: G3T5176 at [redacted]w[redacted]d  1. [redacted] weeks gestation of  pregnancy   2. Obesity in pregnancy   3. Alpha thalassemia silent carrier   4. Supervision of high risk pregnancy in second trimester Continue routine care Pt requests dental clearance note  Anatomy scan today  5. History of pre-eclampsia Start baby ASA  6. Short interval between pregnancies affecting pregnancy, antepartum   7. BMI 37.0-37.9, adult   Preterm labor symptoms and general obstetric precautions including but not limited to vaginal bleeding, contractions, leaking of fluid and fetal movement were reviewed in detail with the patient.  Please refer to After Visit Summary for other counseling recommendations.   Return in about 4 weeks (around 12/11/2020) for virtual, HOB.   Mariel Aloe, MD Faculty Attending Center for [facility]

## 2020-11-14 ENCOUNTER — Other Ambulatory Visit: Payer: Self-pay | Admitting: *Deleted

## 2020-11-14 DIAGNOSIS — Z6837 Body mass index (BMI) 37.0-37.9, adult: Secondary | ICD-10-CM

## 2020-11-14 NOTE — Progress Notes (Signed)
MFM Brief Note  Jessica Lutz is a 35 yo G5P2 who is here single intrauterine pregnancy here for a detailed anatomy due to elevated BMI.  Normal anatomy with measurements consistent with dates There is good fetal movement and amniotic fluid volume  Ms. Podgurski has a low risk NIPS and neg AFP.  An echogenic intracardiac focus was observed today. I discussed that there is no increased risk to the function or structure of the heart. In addition, in the context of a low risk NIPS result the risk for aneuploidy are reduced. There were no additional markers of aneuploidy observed.  I reviewed that an ultrasound is a screening exam and that a diagnostic exam via amnioticenetesis is the only test available to provide a definitive result.  Recommendations: Follow up growth in at 28 weeks.   I spent 30 minutes with > 50% in face to face consultation.   Novella Olive, MD.

## 2020-12-10 ENCOUNTER — Ambulatory Visit (INDEPENDENT_AMBULATORY_CARE_PROVIDER_SITE_OTHER): Payer: Medicaid Other | Admitting: Nurse Practitioner

## 2020-12-10 ENCOUNTER — Other Ambulatory Visit: Payer: Self-pay

## 2020-12-10 VITALS — BP 114/73 | HR 99 | Wt 232.6 lb

## 2020-12-10 DIAGNOSIS — Z711 Person with feared health complaint in whom no diagnosis is made: Secondary | ICD-10-CM

## 2020-12-10 DIAGNOSIS — O99210 Obesity complicating pregnancy, unspecified trimester: Secondary | ICD-10-CM

## 2020-12-10 DIAGNOSIS — O0992 Supervision of high risk pregnancy, unspecified, second trimester: Secondary | ICD-10-CM

## 2020-12-10 NOTE — Progress Notes (Signed)
    Subjective:  Jessica Lutz is a 35 y.o. [id number] at [redacted]w[redacted]d being seen today for ongoing prenatal care.  She is currently monitored for the following issues for this high-risk pregnancy and has Obesity in pregnancy; Alpha thalassemia silent carrier; Preterm labor in third trimester; Supervision of high-risk pregnancy; Polycystic ovarian syndrome; History of pre-eclampsia; Short interval between pregnancies affecting pregnancy, antepartum; BMI 37.0-37.9, adult; Late prenatal care affecting pregnancy in second trimester; and [redacted] weeks gestation of pregnancy on their problem list.  Patient reports no complaints.  Contractions: Not present.  .  Movement: Present. Denies leaking of fluid.   The following portions of the patient's history were reviewed and updated as appropriate: allergies, current medications, past family history, past medical history, past social history, past surgical history and problem list. Problem list updated.  Objective:   Vitals:   12/10/20 1134  BP: 114/73  Pulse: 99  Weight: 232 lb 9.6 oz (105.5 kg)    Fetal Status: Fetal Heart Rate (bpm): 145   Movement: Present     General:  Alert, oriented and cooperative. Patient is in no acute distress.  Skin: Skin is warm and dry. No rash noted.   Cardiovascular: Normal heart rate noted  Respiratory: Normal respiratory effort, no problems with respiration noted  Abdomen: Soft, gravid, appropriate for gestational age. Pain/Pressure: Absent     Pelvic:  Cervical exam deferred        Extremities: Normal range of motion.  Edema: None  Mental Status: Normal mood and affect. Normal behavior. Normal judgment and thought content.   Urinalysis:      Assessment and Plan:  Pregnancy: [id number] at [redacted]w[redacted]d  1. Supervision of high risk pregnancy in second trimester Has her toddler with her today. BP normal. Baby moving well. Anticipatory guidance for next visit given - fasting for glucola testing - TDAP recommended  2. Obesity in  pregnancy Little weight gain - advised it is normal at this time in this pregnancy Fundal height not done today  3. Physically well but worried Was concerned about echogenic focus identified on [place] - MFM reviewed with her but she was still concerned.  Advised it happens in many pregnancies and most likely is not a finding indicating a problem.  Low risk genetic screening result.  Another [place] scheduled 01-22-21  Preterm labor symptoms and general obstetric precautions including but not limited to vaginal bleeding, contractions, leaking of fluid and fetal movement were reviewed in detail with the patient. Please refer to After Visit Summary for other counseling recommendations.  Return in about 5 weeks (around 01/14/2021) for for early AM ROB and glucola testing.  Nolene Bernheim, RN, MSN, NP-BC Nurse Practitioner, [facility] for [employer], [facility] 12/10/2020 12:43 PM

## 2020-12-11 ENCOUNTER — Telehealth: Payer: Medicaid Other | Admitting: Physician Assistant

## 2020-12-11 DIAGNOSIS — Z3490 Encounter for supervision of normal pregnancy, unspecified, unspecified trimester: Secondary | ICD-10-CM

## 2020-12-11 DIAGNOSIS — N898 Other specified noninflammatory disorders of vagina: Secondary | ICD-10-CM

## 2020-12-11 NOTE — Progress Notes (Signed)
For the safety of you and your child, I recommend a face to face office visit with a health care provider.  Many mothers need to take medicines during their pregnancy and while nursing.  Almost all medicines pass into the breast milk in small quantities.  Most are generally considered safe for a mother to take but some medicines must be avoided.  After reviewing your E-Visit request, I recommend that you consult your OB/GYN for medical advice in relation to your condition and prescription medications while pregnant or breastfeeding.  NOTE:  There will be NO CHARGE for this eVisit  If you are having a true medical emergency please call 911.    For an urgent face to face visit, [place] has six urgent care centers for your convenience:     [facility] Urgent Care Center at [facility] Directions [phone] [address] [place], [place] [address]    [facility] Health Urgent Care Center [facility]) Get Driving Directions [phone] [address] [place], [place] [address]  [facility] Health Urgent Care Center [facility]) Get Driving Directions [phone] [address] [place],  [place]  [address]  [facility] Health Urgent Care at [facility] Get Driving Directions [phone] [address], [address] [place], [place] [address]   [facility] Health Urgent Care at [facility] Get Driving Directions  [phone] [address] [place], [place] [address]   [facility] Health Urgent Care at [facility] Directions [phone] [address], [address] [place], [place] [address]  Your MyChart E-visit questionnaire answers were reviewed by a board certified advanced clinical practitioner to complete your personal care plan based on your specific symptoms.  Thank you for using e-Visits.

## 2021-01-14 ENCOUNTER — Other Ambulatory Visit: Payer: Self-pay | Admitting: *Deleted

## 2021-01-14 DIAGNOSIS — O0992 Supervision of high risk pregnancy, unspecified, second trimester: Secondary | ICD-10-CM

## 2021-01-17 ENCOUNTER — Ambulatory Visit (INDEPENDENT_AMBULATORY_CARE_PROVIDER_SITE_OTHER): Payer: Medicaid Other | Admitting: Family Medicine

## 2021-01-17 ENCOUNTER — Other Ambulatory Visit: Payer: Medicaid Other

## 2021-01-17 ENCOUNTER — Encounter: Payer: Self-pay | Admitting: Family Medicine

## 2021-01-17 ENCOUNTER — Telehealth: Payer: Self-pay | Admitting: Family Medicine

## 2021-01-17 ENCOUNTER — Other Ambulatory Visit: Payer: Self-pay

## 2021-01-17 VITALS — BP 114/76 | HR 100 | Wt 233.5 lb

## 2021-01-17 DIAGNOSIS — Z8759 Personal history of other complications of pregnancy, childbirth and the puerperium: Secondary | ICD-10-CM

## 2021-01-17 DIAGNOSIS — Z23 Encounter for immunization: Secondary | ICD-10-CM

## 2021-01-17 DIAGNOSIS — D563 Thalassemia minor: Secondary | ICD-10-CM

## 2021-01-17 DIAGNOSIS — O0993 Supervision of high risk pregnancy, unspecified, third trimester: Secondary | ICD-10-CM

## 2021-01-17 DIAGNOSIS — O99210 Obesity complicating pregnancy, unspecified trimester: Secondary | ICD-10-CM

## 2021-01-17 DIAGNOSIS — O0992 Supervision of high risk pregnancy, unspecified, second trimester: Secondary | ICD-10-CM | POA: Diagnosis not present

## 2021-01-17 DIAGNOSIS — Z8751 Personal history of pre-term labor: Secondary | ICD-10-CM

## 2021-01-17 NOTE — Telephone Encounter (Signed)
Patient checked out from her appointment today she said she will call back to schedule her next appointment because she have to look at her work schedule.

## 2021-01-17 NOTE — Patient Instructions (Signed)

## 2021-01-17 NOTE — Progress Notes (Signed)
   Subjective:  Jessica Lutz is a 35 y.o. [id number] at [redacted]w[redacted]d being seen today for ongoing prenatal care.  She is currently monitored for the following issues for this high-risk pregnancy and has Obesity in pregnancy; Alpha thalassemia silent carrier; History of preterm labor; Supervision of high-risk pregnancy; Polycystic ovarian syndrome; History of pre-eclampsia; Short interval between pregnancies affecting pregnancy, antepartum; BMI 37.0-37.9, adult; Late prenatal care affecting pregnancy in second trimester; and [redacted] weeks gestation of pregnancy on their problem list.  Patient reports no complaints.  Contractions: Irritability. Vag. Bleeding: None.  Movement: Present. Denies leaking of fluid.   The following portions of the patient's history were reviewed and updated as appropriate: allergies, current medications, past family history, past medical history, past social history, past surgical history and problem list. Problem list updated.  Objective:   Vitals:   01/17/21 0855  BP: 114/76  Pulse: 100  Weight: 233 lb 8 oz (105.9 kg)    Fetal Status: Fetal Heart Rate (bpm): 144   Movement: Present     General:  Alert, oriented and cooperative. Patient is in no acute distress.  Skin: Skin is warm and dry. No rash noted.   Cardiovascular: Normal heart rate noted  Respiratory: Normal respiratory effort, no problems with respiration noted  Abdomen: Soft, gravid, appropriate for gestational age. Pain/Pressure: Absent     Pelvic: Vag. Bleeding: None     Cervical exam deferred        Extremities: Normal range of motion.  Edema: None  Mental Status: Normal mood and affect. Normal behavior. Normal judgment and thought content.   Urinalysis:      Assessment and Plan:  Pregnancy: [id number] at [redacted]w[redacted]d  1. Supervision of high risk pregnancy in third trimester BP and FHR normal Accepts TDaP, declines flu 28 wk labs today Still unsure about contraception, referred to [facility] - Tdap vaccine  greater than or equal to 7yo IM  2. History of pre-eclampsia Not taking ASA, encouraged to do so  3. History of preterm labor   4. Alpha thalassemia silent carrier   5. Obesity in pregnancy   Preterm labor symptoms and general obstetric precautions including but not limited to vaginal bleeding, contractions, leaking of fluid and fetal movement were reviewed in detail with the patient. Please refer to After Visit Summary for other counseling recommendations.  Return in 2 weeks (on 01/31/2021) for [facility], ob visit.   Venora Maples, MD

## 2021-01-17 NOTE — Progress Notes (Signed)
Concerned that her Iron is running low due to eating a lot of ice.

## 2021-01-18 LAB — CBC
Hematocrit: 32 % — ABNORMAL LOW (ref 34.0–46.6)
Hemoglobin: 10.7 g/dL — ABNORMAL LOW (ref 11.1–15.9)
MCH: 26.2 pg — ABNORMAL LOW (ref 26.6–33.0)
MCHC: 33.4 g/dL (ref 31.5–35.7)
MCV: 78 fL — ABNORMAL LOW (ref 79–97)
Platelets: 262 10*3/uL (ref 150–450)
RBC: 4.08 x10E6/uL (ref 3.77–5.28)
RDW: 12.6 % (ref 11.7–15.4)
WBC: 8.3 10*3/uL (ref 3.4–10.8)

## 2021-01-18 LAB — GLUCOSE TOLERANCE, 2 HOURS W/ 1HR
Glucose, 1 hour: 133 mg/dL (ref 70–179)
Glucose, 2 hour: 128 mg/dL (ref 70–152)
Glucose, Fasting: 97 mg/dL — ABNORMAL HIGH (ref 70–91)

## 2021-01-18 LAB — RPR: RPR Ser Ql: NONREACTIVE

## 2021-01-18 LAB — HIV ANTIBODY (ROUTINE TESTING W REFLEX): HIV Screen 4th Generation wRfx: NONREACTIVE

## 2021-01-20 ENCOUNTER — Encounter: Payer: Self-pay | Admitting: Family Medicine

## 2021-01-20 ENCOUNTER — Telehealth: Payer: Self-pay

## 2021-01-20 DIAGNOSIS — O24419 Gestational diabetes mellitus in pregnancy, unspecified control: Secondary | ICD-10-CM | POA: Insufficient documentation

## 2021-01-20 MED ORDER — ACCU-CHEK GUIDE VI STRP: ORAL_STRIP | 12 refills | Status: DC

## 2021-01-20 MED ORDER — ACCU-CHEK GUIDE W/DEVICE KIT: 1.0000 | PACK | 0 refills | Status: DC | PRN

## 2021-01-20 MED ORDER — ACCU-CHEK SOFTCLIX LANCETS MISC: 12 refills | Status: DC

## 2021-01-20 NOTE — Telephone Encounter (Addendum)
-----   Message from Venora Maples, MD sent at 01/20/2021  9:49 AM EST ----- 28 wk labs positive for GDM Will notify by mychart, please schedule patient for DM educator and order testing supplies  Called pt and left message that I am calling with results and an appointment if she could please give [place] a call.  Diabetes supplies e-prescribed to [facility] on [address].  MyChart message sent.   Leonette Nutting  01/20/21

## 2021-01-22 ENCOUNTER — Encounter: Payer: Self-pay | Admitting: *Deleted

## 2021-01-22 ENCOUNTER — Other Ambulatory Visit: Payer: Self-pay

## 2021-01-22 ENCOUNTER — Other Ambulatory Visit: Payer: Self-pay | Admitting: *Deleted

## 2021-01-22 ENCOUNTER — Ambulatory Visit: Payer: Medicaid Other | Admitting: *Deleted

## 2021-01-22 ENCOUNTER — Ambulatory Visit (HOSPITAL_BASED_OUTPATIENT_CLINIC_OR_DEPARTMENT_OTHER): Payer: Medicaid Other | Admitting: Obstetrics and Gynecology

## 2021-01-22 ENCOUNTER — Ambulatory Visit: Payer: Medicaid Other | Attending: Maternal & Fetal Medicine

## 2021-01-22 ENCOUNTER — Encounter: Payer: Self-pay | Admitting: Family Medicine

## 2021-01-22 VITALS — BP 115/68 | HR 104

## 2021-01-22 DIAGNOSIS — Z3A28 28 weeks gestation of pregnancy: Secondary | ICD-10-CM

## 2021-01-22 DIAGNOSIS — O09293 Supervision of pregnancy with other poor reproductive or obstetric history, third trimester: Secondary | ICD-10-CM | POA: Insufficient documentation

## 2021-01-22 DIAGNOSIS — O24419 Gestational diabetes mellitus in pregnancy, unspecified control: Secondary | ICD-10-CM

## 2021-01-22 DIAGNOSIS — O99213 Obesity complicating pregnancy, third trimester: Secondary | ICD-10-CM

## 2021-01-22 DIAGNOSIS — Z6837 Body mass index (BMI) 37.0-37.9, adult: Secondary | ICD-10-CM

## 2021-01-22 DIAGNOSIS — E669 Obesity, unspecified: Secondary | ICD-10-CM | POA: Diagnosis not present

## 2021-01-22 NOTE — Progress Notes (Signed)
Maternal-Fetal Medicine   Name: Jessica Lutz DOB: 11/13/1995 MRN: [id number] Referring Provider: [place] Bing, MD  I had the pleasure of seeing [name] [facility] at [facility].  She is return for fetal growth assessment.  She has a new diagnosis of gestational diabetes.  Patient is asked to start checking her blood glucose. Blood pressure today at her office is 115/68 mmHg.  Ultrasound Fetal growth is appropriate for gestational age.  Amniotic fluid is normal and good fetal activity seen.  Gestational diabetes I explained the diagnosis of gestational diabetes.  I emphasized the importance of good blood glucose control to prevent adverse fetal or neonatal outcomes.  I discussed blood glucose normal values. I encouraged her to check her blood glucose regularly. Possible complications of gestational diabetes include fetal macrosomia, shoulder dystocia and birth injuries, stillbirth (in poorly controlled diabetes) and neonatal respiratory syndrome and other complications.  In about 85% of cases, gestational diabetes is well controlled by diet alone.  Exercise reduces the need for insulin.  Medical treatment includes oral hypoglycemics or insulin. If patient requires insulin or oral hypoglycemics, we recommend weekly antenatal testing from 32 weeks' gestation till delivery.  Timing of delivery: In well-controlled diabetes on diet, patient can be delivered at 68- or 40-weeks' gestation. Vagal delivery is not contraindicated. Type 2 diabetes develops in about 25% to 40% of women with GDM. I recommend postpartum screening with 75-g glucose load at 6 to 12 weeks after delivery. Recommendations -An appointment was made for her to return in 4 weeks for fetal growth assessment. -If patient takes oral hypoglycemics or insulin, we recommend weekly BPP from [redacted] weeks gestation till delivery.  Thank you for consultation.  If you have any questions or concerns, please contact me  the [facility].  Consultation including face-to-face (more than 50%) counseling 30 minutes.

## 2021-01-27 ENCOUNTER — Other Ambulatory Visit: Payer: Self-pay | Admitting: *Deleted

## 2021-01-27 DIAGNOSIS — O24419 Gestational diabetes mellitus in pregnancy, unspecified control: Secondary | ICD-10-CM

## 2021-01-30 ENCOUNTER — Ambulatory Visit: Payer: Medicaid Other | Admitting: Registered"

## 2021-01-30 ENCOUNTER — Other Ambulatory Visit: Payer: Self-pay

## 2021-01-30 ENCOUNTER — Encounter: Payer: Medicaid Other | Attending: Family Medicine | Admitting: Registered"

## 2021-01-30 DIAGNOSIS — O24419 Gestational diabetes mellitus in pregnancy, unspecified control: Secondary | ICD-10-CM | POA: Diagnosis not present

## 2021-01-30 DIAGNOSIS — Z3A00 Weeks of gestation of pregnancy not specified: Secondary | ICD-10-CM | POA: Diagnosis not present

## 2021-01-30 NOTE — Progress Notes (Addendum)
Patient was seen for Gestational Diabetes self-management on 01/30/21  Start time 1025 and End time 1120   Estimated due date: 04/13/21; [redacted]w[redacted]d  Clinical: Medications: reviewed Medical History: reviewed Labs: OGTT 97 FBS, A1c 5.8% 10/23/20  Dietary and Lifestyle History: Pt states she chews on ice and plans to ask MD if she is iron deficient. RD provided handout for iron deficiency.   Pt states she works from home and does not get a lot of activity.   Physical Activity: ADLs Stress: not assessed Sleep: is disturbed during the night and has a hard time going back to sleep, watches TV  24 hr Recall:  First Meal: 3x/week pork chop biscuit, pumpkin spice latte  Snack: fruit or fruit cups Second meal: Snack: naps or granola bar Third meal: steak biscuit, chicken wings Snack: fruit OR nutrigrain bar Beverages: coffee in the morning, water, kool aid occasionally  NUTRITION INTERVENTION  Nutrition education (E-1) on the following topics:   Initial Follow-up  [x]  []  Definition of Gestational Diabetes [x]  []  Why dietary management is important in controlling blood glucose [x]  []  Effects each nutrient has on blood glucose levels [x]  []  Simple carbohydrates vs complex carbohydrates [x]  []  Fluid intake [x]  []  Creating a balanced meal plan [x]  []  Carbohydrate counting  [x]  []  When to check blood glucose levels [x]  []  Proper blood glucose monitoring techniques [x]  []  Effect of stress and stress reduction techniques  [x]  []  Exercise effect on blood glucose levels, appropriate exercise during pregnancy [x]  []  Importance of limiting caffeine and abstaining from alcohol and smoking [x]  []  Medications used for blood sugar control during pregnancy [x]  []  Hypoglycemia and rule of 15 [x]  []  Postpartum self care  Patient already has a meter, is not testing pre breakfast and 2 hours after each meal. Brought meter for instruction FBS: 89 mg/dL(was fasting at visit)  Patient instructed to  monitor glucose levels: FBS: 60 - ? 95 mg/dL (some clinics use 90 for cutoff) 1 hour: ? 140 mg/dL 2 hour: ? mg/dL  Patient received handouts: Nutrition Diabetes and Pregnancy Carbohydrate Counting List AND Iron deficiency MNT  Patient will be seen for follow-up as needed.

## 2021-02-05 ENCOUNTER — Other Ambulatory Visit: Payer: Self-pay

## 2021-02-09 NOTE — L&D Delivery Note (Signed)
[facility] Delivery Note  Jessica Lutz is a 36 y.o. [id number] s/p preterm vag del at [redacted]w[redacted]d. She was admitted for PPROM.   ROM: 7h 34m with clear fluid GBS Status: neg Maximum Maternal Temperature: 98.4  Labor Progress: [name] was admitted on the evening of February 8th with PPROM at 1620. She was 7cm upon arrival, received an epidural, and then had some Pitocin started as her ctx had spaced out.  Delivery Date/Time: March 20, 2021 at 0015 Delivery: Called to room and patient was complete. She pushed once and the head delivered ROA. No nuchal cord present. Shoulder and body delivered in usual fashion. Infant with spontaneous cry, placed on mother's abdomen, dried and stimulated. Cord clamped x 2 after 1-minute delay, and cut by mother of patient. Cord blood drawn. Placenta delivered spontaneously with gentle cord traction. Fundus firm with massage and Pitocin. Labia, perineum, vagina, and cervix inspected and found to be intact.   Placenta: spont, intact; to L&D Complications: none Lacerations: none EBL: 100cc Analgesia: epidural  Liletta placed postplacentally with epidural for anesthesia- pt tolerated well (see Procedure note).  Postpartum Planning [x]  message to sent to schedule follow-up   Infant: girl   APGARs 9/9   2920g (6lb 7oz)  05-25-1979, [name]  03/20/2021 12:50 AM

## 2021-02-13 ENCOUNTER — Encounter: Payer: Self-pay | Admitting: Obstetrics and Gynecology

## 2021-02-13 ENCOUNTER — Ambulatory Visit (INDEPENDENT_AMBULATORY_CARE_PROVIDER_SITE_OTHER): Payer: Medicaid Other | Admitting: Obstetrics and Gynecology

## 2021-02-13 ENCOUNTER — Other Ambulatory Visit: Payer: Self-pay

## 2021-02-13 VITALS — BP 114/81 | HR 72 | Wt 230.8 lb

## 2021-02-13 DIAGNOSIS — Z8759 Personal history of other complications of pregnancy, childbirth and the puerperium: Secondary | ICD-10-CM

## 2021-02-13 DIAGNOSIS — O0993 Supervision of high risk pregnancy, unspecified, third trimester: Secondary | ICD-10-CM

## 2021-02-13 DIAGNOSIS — O24410 Gestational diabetes mellitus in pregnancy, diet controlled: Secondary | ICD-10-CM

## 2021-02-13 MED ORDER — METFORMIN HCL 500 MG PO TABS: 500.0000 mg | ORAL_TABLET | Freq: Every day | ORAL | 5 refills | Status: DC

## 2021-02-13 NOTE — Patient Instructions (Signed)
Gestational Diabetes Mellitus, Self-Care When you have gestational diabetes mellitus, you must make sure your blood sugar (glucose) stays at a healthy level. What are the risks? If you do not get treated for this condition, it may cause problems for you andyour unborn baby. For the mother Giving birth to the baby early. Having problems during labor and when giving birth. Needing surgery to give birth to the baby (cesarean delivery). Having problems with blood pressure. Getting this form of diabetes again when pregnant. Getting type 2 diabetes in the future. For the baby Low blood sugar. Bigger body size than is normal. Breathing problems. How to monitor blood sugar Check your blood sugar every day while you are pregnant. Check it as often as told by your doctor. To do this: Wash your hands with soap and water for at least 20 seconds. Prick the side of your finger (not the tip) with the lancet. Use a different finger each time. Gently rub the finger until a small drop of blood appears. Follow instructions that come with your meter for: Putting in the test strip. Putting blood on the strip. Getting the result. Write down your result and any notes. In general, your blood sugar levels should be: 95 mg/dL (5.3 mmol/L) if you have not eaten. 140 mg/dL (7.8 mmol/L) 1 hour after a meal. 120 mg/dL (6.7 mmol/L) 2 hours after a meal. Follow these instructions at home: Medicines Take over-the-counter and prescription medicines only as told by your doctor. If your doctor prescribed insulin or other diabetes medicines: Take them every day. Do not run out of insulin or other medicines. Plan ahead so you always have them. Eating and drinking  Follow instructions from your doctor about eating or drinking restrictions. See a food expert (dietician) to help you create an eating plan that helps control your blood sugar. The foods in this plan will include: Low-fat proteins. Dried beans, nuts, and  whole grain breads, cereals, or pasta. Fresh fruits and vegetables. Low-fat dairy products. Healthy fats. Eat healthy snacks between healthy meals. Drink enough fluid to keep your pee (urine) pale yellow. Keep track of carbs that you eat. To do this: Read food labels. Learn the serving sizes of foods. Follow your sick day plan when you cannot eat or drink normally. Make this plan with your doctor so it is ready to use.  Activity Do exercises as told by your doctor. Exercise for 30 or more minutes a day, or as much as your doctor recommends. To help you control blood sugar levels after a meal: Do 10 minutes of exercise after each meal. Start this exercise 30 minutes after the meal. Talk with your doctor before you start a new exercise. Your doctor may tell you to change your insulin, other medicines, or food. Lifestyle Do not drink alcohol. Do not use any products that contain nicotine or tobacco, such as cigarettes, e-cigarettes, and chewing tobacco. If you need help quitting, ask your doctor. Learn how to deal with stress. If you need help with this, ask your doctor. Body care Stay up to date with your shots (vaccines). Take good care of your teeth. To do this: Brush your teeth and gums two times a day. Floss one or more times a day. Go to the dentist one or more times every 6 months. Stay at a healthy weight while you are pregnant. General instructions Ask your doctor about risks of high blood pressure in pregnancy. Share your diabetes care plan with: Your work or school. People   you live with. Check your pee for ketones: When you are sick. As told by your doctor. Carry a card or wear a bracelet that says you have diabetes. Keep all follow-up visits. Care after giving birth Have your blood sugar checked 4-12 weeks after you give birth. Get checked for diabetes one or more times every 3 years or as told. Where to find more information American Diabetes Association (ADA):  diabetes.org Association of Diabetes Care & Education Specialists (ADCES): diabeteseducator.org Centers for Disease Control and Prevention (CDC): cdc.gov American Pregnancy Association: americanpregnancy.org U.[place]. Department of Agriculture MyPlate: myplate.gov Contact a doctor if: Your blood sugar is above your target for two tests in a row. You have a fever. You are sick for 2 days or more and do not get better. You have either of these problems for more than 6 hours: You vomit every time you eat or drink. You have watery poop (diarrhea). Get help right away if: You cannot think clearly. You have trouble breathing. You have moderate or high ketones in your pee. Blood or abnormal fluid starts to come out of your vagina. You feel your baby is not moving as usual. You start having early contractions. You may feel your belly tighten. You have a very bad headache. These symptoms may be an emergency. Get help right away. Call your local emergency services (911 in the [place] Do not wait to see if the symptoms will go away. Do not drive yourself to the hospital. Summary Check your blood sugar (glucose) while you are pregnant. Check it as often as told by your doctor. Take your insulin and diabetes medicines as told. Have your blood sugar checked 4-12 weeks after you give birth. Keep all follow-up visits. This information is not intended to replace advice given to you by your health care provider. Make sure you discuss any questions you have with your healthcare provider. Document Revised: 07/03/2019 Document Reviewed: 07/03/2019 Elsevier Patient Education  2022 Elsevier Inc.  

## 2021-02-13 NOTE — Progress Notes (Signed)
Subjective:  Jessica Lutz is a 36 y.o. [id number] at [redacted]w[redacted]d being seen today for ongoing prenatal care.  She is currently monitored for the following issues for this high-risk pregnancy and has Obesity in pregnancy; Alpha thalassemia silent carrier; History of preterm labor; Supervision of high-risk pregnancy; Polycystic ovarian syndrome; History of pre-eclampsia; Short interval between pregnancies affecting pregnancy, antepartum; BMI 37.0-37.9, adult; Late prenatal care affecting pregnancy in second trimester; and GDM (gestational diabetes mellitus) on their problem list.  Patient reports general discomforts of pregnancy.  Contractions: Irritability. Vag. Bleeding: None.  Movement: Present. Denies leaking of fluid.   The following portions of the patient's history were reviewed and updated as appropriate: allergies, current medications, past family history, past medical history, past social history, past surgical history and problem list. Problem list updated.  Objective:   Vitals:   02/13/21 1515  Weight: 230 lb 12.8 oz (104.7 kg)    Fetal Status:     Movement: Present     General:  Alert, oriented and cooperative. Patient is in no acute distress.  Skin: Skin is warm and dry. No rash noted.   Cardiovascular: Normal heart rate noted  Respiratory: Normal respiratory effort, no problems with respiration noted  Abdomen: Soft, gravid, appropriate for gestational age. Pain/Pressure: Absent     Pelvic:  Cervical exam deferred        Extremities: Normal range of motion.  Edema: None  Mental Status: Normal mood and affect. Normal behavior. Normal judgment and thought content.   Urinalysis:      Assessment and Plan:  Pregnancy: [id number] at [redacted]w[redacted]d  1. Supervision of high risk pregnancy in third trimester Stable  2. Diet controlled gestational diabetes mellitus (GDM) in third trimester Fasting and 2 hr PP evening meals elevated Will start Metformin 500 mg with evening meals. Antenatal testing  and serial growth scans as per protocol to start tomorrow  3. History of pre-eclampsia No S/Sx at present  Preterm labor symptoms and general obstetric precautions including but not limited to vaginal bleeding, contractions, leaking of fluid and fetal movement were reviewed in detail with the patient. Please refer to After Visit Summary for other counseling recommendations.  Return in about 2 weeks (around 02/27/2021) for OB visit, face to face, MD only.   Chancy Milroy, MD

## 2021-02-14 ENCOUNTER — Encounter: Payer: Medicaid Other | Admitting: Obstetrics and Gynecology

## 2021-02-16 ENCOUNTER — Encounter: Payer: Self-pay | Admitting: Radiology

## 2021-02-19 ENCOUNTER — Other Ambulatory Visit: Payer: Self-pay | Admitting: Obstetrics and Gynecology

## 2021-02-19 ENCOUNTER — Ambulatory Visit: Payer: Medicaid Other | Attending: Obstetrics and Gynecology

## 2021-02-19 ENCOUNTER — Ambulatory Visit: Payer: Medicaid Other | Admitting: *Deleted

## 2021-02-19 ENCOUNTER — Other Ambulatory Visit: Payer: Self-pay

## 2021-02-19 VITALS — BP 103/64 | HR 115

## 2021-02-19 DIAGNOSIS — O24415 Gestational diabetes mellitus in pregnancy, controlled by oral hypoglycemic drugs: Secondary | ICD-10-CM

## 2021-02-19 DIAGNOSIS — O24419 Gestational diabetes mellitus in pregnancy, unspecified control: Secondary | ICD-10-CM | POA: Diagnosis not present

## 2021-02-19 DIAGNOSIS — Z3A32 32 weeks gestation of pregnancy: Secondary | ICD-10-CM

## 2021-02-19 DIAGNOSIS — O99213 Obesity complicating pregnancy, third trimester: Secondary | ICD-10-CM | POA: Diagnosis not present

## 2021-02-19 NOTE — Procedures (Signed)
Jessica Lutz 01/02/1986 [redacted]w[redacted]d  Fetus A Non-Stress Test Interpretation for 02/19/21  Indication: Unsatisfactory BPP  Fetal Heart Rate A Mode: External Baseline Rate (A): 135 bpm Variability: Moderate Accelerations: 15 x 15 Decelerations: None  Uterine Activity Mode: Toco Contraction Frequency (min): one in 20 min Contraction Duration (sec): 50 Contraction Quality: Mild (not felt by pt) Resting Time: Adequate  Interpretation (Fetal Testing) Nonstress Test Interpretation: Reactive Overall Impression: Reassuring for gestational age Comments: tracing reviewewd by Dr. Beacher May

## 2021-02-20 ENCOUNTER — Other Ambulatory Visit: Payer: Self-pay | Admitting: *Deleted

## 2021-02-20 DIAGNOSIS — O24419 Gestational diabetes mellitus in pregnancy, unspecified control: Secondary | ICD-10-CM

## 2021-02-27 ENCOUNTER — Ambulatory Visit: Payer: Medicaid Other | Attending: Obstetrics and Gynecology

## 2021-02-27 ENCOUNTER — Ambulatory Visit: Payer: Medicaid Other | Admitting: *Deleted

## 2021-02-27 ENCOUNTER — Other Ambulatory Visit: Payer: Self-pay

## 2021-02-27 ENCOUNTER — Ambulatory Visit (INDEPENDENT_AMBULATORY_CARE_PROVIDER_SITE_OTHER): Payer: Medicaid Other | Admitting: Family Medicine

## 2021-02-27 VITALS — BP 121/79 | HR 92 | Wt 234.9 lb

## 2021-02-27 VITALS — BP 101/71 | HR 105

## 2021-02-27 DIAGNOSIS — O0993 Supervision of high risk pregnancy, unspecified, third trimester: Secondary | ICD-10-CM

## 2021-02-27 DIAGNOSIS — O24410 Gestational diabetes mellitus in pregnancy, diet controlled: Secondary | ICD-10-CM

## 2021-02-27 DIAGNOSIS — E669 Obesity, unspecified: Secondary | ICD-10-CM | POA: Diagnosis not present

## 2021-02-27 DIAGNOSIS — O99213 Obesity complicating pregnancy, third trimester: Secondary | ICD-10-CM

## 2021-02-27 DIAGNOSIS — O24419 Gestational diabetes mellitus in pregnancy, unspecified control: Secondary | ICD-10-CM | POA: Insufficient documentation

## 2021-02-27 DIAGNOSIS — Z3A33 33 weeks gestation of pregnancy: Secondary | ICD-10-CM | POA: Diagnosis not present

## 2021-02-27 DIAGNOSIS — Z8751 Personal history of pre-term labor: Secondary | ICD-10-CM

## 2021-02-27 DIAGNOSIS — O0932 Supervision of pregnancy with insufficient antenatal care, second trimester: Secondary | ICD-10-CM

## 2021-02-27 DIAGNOSIS — O09899 Supervision of other high risk pregnancies, unspecified trimester: Secondary | ICD-10-CM

## 2021-02-27 DIAGNOSIS — Z8759 Personal history of other complications of pregnancy, childbirth and the puerperium: Secondary | ICD-10-CM

## 2021-02-27 MED ORDER — ASPIRIN EC 81 MG PO TBEC: 81.0000 mg | DELAYED_RELEASE_TABLET | Freq: Every day | ORAL | 11 refills | Status: DC

## 2021-02-27 NOTE — Progress Notes (Signed)
° °  PRENATAL VISIT NOTE  Subjective:  Jessica Lutz is a 36 y.o. [id number] at [redacted]w[redacted]d being seen today for ongoing prenatal care.  She is currently monitored for the following issues for this high-risk pregnancy and has Obesity in pregnancy; Alpha thalassemia silent carrier; History of preterm labor; Supervision of high-risk pregnancy; Polycystic ovarian syndrome; History of pre-eclampsia; Short interval between pregnancies affecting pregnancy, antepartum; BMI 37.0-37.9, adult; Late prenatal care affecting pregnancy in second trimester; and GDM (gestational diabetes mellitus) on their problem list.  Patient reports no complaints.  Contractions: Irritability. Vag. Bleeding: None.  Movement: Present. Denies leaking of fluid.   The following portions of the patient's history were reviewed and updated as appropriate: allergies, current medications, past family history, past medical history, past social history, past surgical history and problem list.   Objective:   Vitals:   02/27/21 1538  BP: 121/79  Pulse: 92  Weight: 234 lb 14.4 oz (106.5 kg)    Fetal Status: Fetal Heart Rate (bpm): 131 Fundal Height: 34 cm Movement: Present     General:  Alert, oriented and cooperative. Patient is in no acute distress.  Skin: Skin is warm and dry. No rash noted.   Cardiovascular: Normal heart rate noted  Respiratory: Normal respiratory effort, no problems with respiration noted  Abdomen: Soft, gravid, appropriate for gestational age.  Pain/Pressure: Present     Pelvic: Cervical exam deferred        Extremities: Normal range of motion.  Edema: None  Mental Status: Normal mood and affect. Normal behavior. Normal judgment and thought content.   Assessment and Plan:  Pregnancy: [id number] at [redacted]w[redacted]d 1. Diet controlled gestational diabetes mellitus (GDM) in third trimester See log, few pp out of range--not taking metformin In antenatal testing. Last u/s for growth at 40%  2. History of preterm labor No  s/sx's today  3. Supervision of high risk pregnancy in third trimester   4. Late prenatal care affecting pregnancy in second trimester   5. History of pre-eclampsia Refill her ASA - aspirin EC 81 MG tablet; Take 1 tablet (81 mg total) by mouth daily. Swallow whole.  Dispense: 30 tablet; Refill: 11  6. Short interval between pregnancies affecting pregnancy, antepartum   Preterm labor symptoms and general obstetric precautions including but not limited to vaginal bleeding, contractions, leaking of fluid and fetal movement were reviewed in detail with the patient. Please refer to After Visit Summary for other counseling recommendations.   Return in about 2 weeks (around 03/13/2021) for [facility].  Future Appointments  Date Time Provider Department Center  03/06/2021  3:15 PM [facility] NST [facility]  03/13/2021  2:15 PM [facility]  03/13/2021  3:55 PM Reva Bores, MD [facility]  03/20/2021  3:55 PM Hermina Staggers, MD [facility]  03/21/2021  3:30 PM [facility] NURSE [facility]  03/21/2021  3:45 PM [facility] US5 [facility]  03/26/2021  3:55 PM Venora Maples, MD [facility]  04/02/2021  3:55 PM Adam Phenix, MD [facility]    Reva Bores, MD

## 2021-03-02 ENCOUNTER — Encounter (HOSPITAL_COMMUNITY): Payer: Self-pay | Admitting: Family Medicine

## 2021-03-02 ENCOUNTER — Observation Stay (HOSPITAL_COMMUNITY)
Admission: AD | Admit: 2021-03-02 | Discharge: 2021-03-03 | Disposition: A | Discharge status: Home / Self Care | Payer: Medicaid Other | Attending: Family Medicine | Admitting: Family Medicine

## 2021-03-02 ENCOUNTER — Other Ambulatory Visit: Payer: Self-pay

## 2021-03-02 DIAGNOSIS — O133 Gestational [pregnancy-induced] hypertension without significant proteinuria, third trimester: Secondary | ICD-10-CM | POA: Diagnosis not present

## 2021-03-02 DIAGNOSIS — O4703 False labor before 37 completed weeks of gestation, third trimester: Secondary | ICD-10-CM | POA: Diagnosis not present

## 2021-03-02 DIAGNOSIS — O99210 Obesity complicating pregnancy, unspecified trimester: Secondary | ICD-10-CM | POA: Diagnosis present

## 2021-03-02 DIAGNOSIS — Z3A34 34 weeks gestation of pregnancy: Secondary | ICD-10-CM | POA: Insufficient documentation

## 2021-03-02 DIAGNOSIS — O0932 Supervision of pregnancy with insufficient antenatal care, second trimester: Secondary | ICD-10-CM

## 2021-03-02 DIAGNOSIS — O09899 Supervision of other high risk pregnancies, unspecified trimester: Secondary | ICD-10-CM

## 2021-03-02 DIAGNOSIS — R030 Elevated blood-pressure reading, without diagnosis of hypertension: Secondary | ICD-10-CM

## 2021-03-02 DIAGNOSIS — E669 Obesity, unspecified: Secondary | ICD-10-CM | POA: Diagnosis not present

## 2021-03-02 DIAGNOSIS — Z8751 Personal history of pre-term labor: Secondary | ICD-10-CM

## 2021-03-02 DIAGNOSIS — Z7984 Long term (current) use of oral hypoglycemic drugs: Secondary | ICD-10-CM | POA: Diagnosis not present

## 2021-03-02 DIAGNOSIS — O0990 Supervision of high risk pregnancy, unspecified, unspecified trimester: Secondary | ICD-10-CM

## 2021-03-02 DIAGNOSIS — Z79899 Other long term (current) drug therapy: Secondary | ICD-10-CM | POA: Insufficient documentation

## 2021-03-02 DIAGNOSIS — O99213 Obesity complicating pregnancy, third trimester: Secondary | ICD-10-CM | POA: Diagnosis not present

## 2021-03-02 DIAGNOSIS — O24419 Gestational diabetes mellitus in pregnancy, unspecified control: Secondary | ICD-10-CM | POA: Diagnosis present

## 2021-03-02 DIAGNOSIS — Z8759 Personal history of other complications of pregnancy, childbirth and the puerperium: Secondary | ICD-10-CM

## 2021-03-02 DIAGNOSIS — Z7982 Long term (current) use of aspirin: Secondary | ICD-10-CM | POA: Insufficient documentation

## 2021-03-02 DIAGNOSIS — D563 Thalassemia minor: Secondary | ICD-10-CM | POA: Diagnosis present

## 2021-03-02 DIAGNOSIS — Z20822 Contact with and (suspected) exposure to covid-19: Secondary | ICD-10-CM | POA: Insufficient documentation

## 2021-03-02 LAB — TYPE AND SCREEN
ABO/RH(D): A POS
Antibody Screen: NEGATIVE

## 2021-03-02 LAB — COMPREHENSIVE METABOLIC PANEL
ALT: 12 U/L (ref 0–44)
ALT: 10 U/L (ref 0–44)
AST: 26 U/L (ref 15–41)
AST: 20 U/L (ref 15–41)
Albumin: 2.9 g/dL — ABNORMAL LOW (ref 3.5–5.0)
Albumin: 2.6 g/dL — ABNORMAL LOW (ref 3.5–5.0)
Alkaline Phosphatase: 169 U/L — ABNORMAL HIGH (ref 38–126)
Alkaline Phosphatase: 172 U/L — ABNORMAL HIGH (ref 38–126)
Anion gap: 11 (ref 5–15)
Anion gap: 13 (ref 5–15)
BUN: <5 mg/dL — ABNORMAL LOW (ref 6–20)
BUN: 5 mg/dL — ABNORMAL LOW (ref 6–20)
CO2: 17 mmol/L — ABNORMAL LOW (ref 22–32)
CO2: 18 mmol/L — ABNORMAL LOW (ref 22–32)
Calcium: 9.5 mg/dL (ref 8.9–10.3)
Calcium: 9.6 mg/dL (ref 8.9–10.3)
Chloride: 106 mmol/L (ref 98–111)
Chloride: 105 mmol/L (ref 98–111)
Creatinine, Ser: 0.7 mg/dL (ref 0.44–1.00)
Creatinine, Ser: 0.6 mg/dL (ref 0.44–1.00)
GFR, Estimated: >60 mL/min (ref >60)
GFR, Estimated: >60 mL/min (ref >60)
Glucose, Bld: 89 mg/dL (ref 70–99)
Glucose, Bld: 94 mg/dL (ref 70–99)
Potassium: 4.2 mmol/L (ref 3.5–5.1)
Potassium: 3.9 mmol/L (ref 3.5–5.1)
Sodium: 134 mmol/L — ABNORMAL LOW (ref 135–145)
Sodium: 136 mmol/L (ref 135–145)
Total Bilirubin: 0.9 mg/dL (ref 0.3–1.2)
Total Bilirubin: 0.8 mg/dL (ref 0.3–1.2)
Total Protein: 6.6 g/dL (ref 6.5–8.1)
Total Protein: 6.2 g/dL — ABNORMAL LOW (ref 6.5–8.1)

## 2021-03-02 LAB — RESP PANEL BY RT-PCR (FLU A&B, COVID) ARPGX2
Influenza A by PCR: NEGATIVE
Influenza B by PCR: NEGATIVE
SARS Coronavirus 2 by RT PCR: NEGATIVE

## 2021-03-02 LAB — CBC WITH DIFFERENTIAL/PLATELET
Abs Immature Granulocytes: 0.09 10*3/uL — ABNORMAL HIGH (ref 0.00–0.07)
Basophils Absolute: 0 10*3/uL (ref 0.0–0.1)
Basophils Relative: 0 %
Eosinophils Absolute: 0.1 10*3/uL (ref 0.0–0.5)
Eosinophils Relative: 1 %
HCT: 36.2 % (ref 36.0–46.0)
Hemoglobin: 11.5 g/dL — ABNORMAL LOW (ref 12.0–15.0)
Immature Granulocytes: 1 %
Lymphocytes Relative: 26 %
Lymphs Abs: 2.3 10*3/uL (ref 0.7–4.0)
MCH: 25.2 pg — ABNORMAL LOW (ref 26.0–34.0)
MCHC: 31.8 g/dL (ref 30.0–36.0)
MCV: 79.4 fL — ABNORMAL LOW (ref 80.0–100.0)
Monocytes Absolute: 0.8 10*3/uL (ref 0.1–1.0)
Monocytes Relative: 9 %
Neutro Abs: 5.6 10*3/uL (ref 1.7–7.7)
Neutrophils Relative %: 63 %
Platelets: 292 10*3/uL (ref 150–400)
RBC: 4.56 MIL/uL (ref 3.87–5.11)
RDW: 15 % (ref 11.5–15.5)
WBC: 8.8 10*3/uL (ref 4.0–10.5)
nRBC: 0 % (ref 0.0–0.2)

## 2021-03-02 LAB — CBC
HCT: 33.6 % — ABNORMAL LOW (ref 36.0–46.0)
Hemoglobin: 11.2 g/dL — ABNORMAL LOW (ref 12.0–15.0)
MCH: 26 pg (ref 26.0–34.0)
MCHC: 33.3 g/dL (ref 30.0–36.0)
MCV: 78.1 fL — ABNORMAL LOW (ref 80.0–100.0)
Platelets: 304 10*3/uL (ref 150–400)
RBC: 4.3 MIL/uL (ref 3.87–5.11)
RDW: 15 % (ref 11.5–15.5)
WBC: 10.4 10*3/uL (ref 4.0–10.5)
nRBC: 0 % (ref 0.0–0.2)

## 2021-03-02 LAB — RAPID HIV SCREEN (HIV 1/2 AB+AG)
HIV 1/2 Antibodies: NONREACTIVE
HIV-1 P24 Antigen - HIV24: NONREACTIVE

## 2021-03-02 LAB — GLUCOSE, CAPILLARY
Glucose-Capillary: 91 mg/dL (ref 70–99)
Glucose-Capillary: 87 mg/dL (ref 70–99)
Glucose-Capillary: 119 mg/dL — ABNORMAL HIGH (ref 70–99)

## 2021-03-02 LAB — GROUP B STREP BY PCR: Group B strep by PCR: NEGATIVE

## 2021-03-02 LAB — PROTEIN / CREATININE RATIO, URINE
Creatinine, Urine: 82.11 mg/dL
Protein Creatinine Ratio: 0.33 mg/mg{Cre} — ABNORMAL HIGH (ref 0.00–0.15)
Total Protein, Urine: 27 mg/dL

## 2021-03-02 MED ORDER — PHENYLEPHRINE 40 MCG/ML (10ML) SYRINGE FOR IV PUSH (FOR BLOOD PRESSURE SUPPORT): 80.0000 ug | PREFILLED_SYRINGE | INTRAVENOUS | Status: DC | PRN

## 2021-03-02 MED ORDER — SODIUM CHLORIDE 0.9 % IV SOLN
2.0000 g | Freq: Once | INTRAVENOUS | Status: AC
  Administered 2021-03-02: 2 g via INTRAVENOUS
  Filled 2021-03-02: qty 2000

## 2021-03-02 MED ORDER — OXYTOCIN-SODIUM CHLORIDE 30-0.9 UT/500ML-% IV SOLN: 2.5000 [IU]/h | INTRAVENOUS | Status: DC

## 2021-03-02 MED ORDER — EPHEDRINE 5 MG/ML INJ: 10.0000 mg | INTRAVENOUS | Status: DC | PRN

## 2021-03-02 MED ORDER — ONDANSETRON HCL 4 MG/2ML IJ SOLN
4.0000 mg | Freq: Four times a day (QID) | INTRAMUSCULAR | Status: DC | PRN
  Administered 2021-03-03: 4 mg via INTRAVENOUS
  Filled 2021-03-02: qty 2

## 2021-03-02 MED ORDER — NIFEDIPINE 10 MG PO CAPS
10.0000 mg | ORAL_CAPSULE | ORAL | Status: AC
  Administered 2021-03-02 (×3): 10 mg via ORAL
  Filled 2021-03-02 (×3): qty 1

## 2021-03-02 MED ORDER — SODIUM CHLORIDE 0.9 % IV SOLN
1.0000 g | INTRAVENOUS | Status: DC
  Administered 2021-03-02: 1 g via INTRAVENOUS
  Filled 2021-03-02: qty 1000

## 2021-03-02 MED ORDER — LIDOCAINE HCL (PF) 1 % IJ SOLN: 30.0000 mL | INTRAMUSCULAR | Status: DC | PRN

## 2021-03-02 MED ORDER — LACTATED RINGERS IV SOLN
INTRAVENOUS | Status: DC
  Administered 2021-03-02 – 2021-03-03 (×2): 950 mL via INTRAVENOUS

## 2021-03-02 MED ORDER — OXYCODONE-ACETAMINOPHEN 5-325 MG PO TABS: 1.0000 | ORAL_TABLET | ORAL | Status: DC | PRN

## 2021-03-02 MED ORDER — SOD CITRATE-CITRIC ACID 500-334 MG/5ML PO SOLN: 30.0000 mL | ORAL | Status: DC | PRN

## 2021-03-02 MED ORDER — ACETAMINOPHEN 325 MG PO TABS: 650.0000 mg | ORAL_TABLET | ORAL | Status: DC | PRN

## 2021-03-02 MED ORDER — LACTATED RINGERS IV SOLN: 500.0000 mL | Freq: Once | INTRAVENOUS | Status: DC

## 2021-03-02 MED ORDER — NIFEDIPINE 10 MG PO CAPS
10.0000 mg | ORAL_CAPSULE | ORAL | Status: AC
  Administered 2021-03-02 (×2): 10 mg via ORAL
  Filled 2021-03-02 (×2): qty 1

## 2021-03-02 MED ORDER — TERBUTALINE SULFATE 1 MG/ML IJ SOLN
0.2500 mg | Freq: Once | INTRAMUSCULAR | Status: AC
  Administered 2021-03-02: 0.25 mg via SUBCUTANEOUS

## 2021-03-02 MED ORDER — TERBUTALINE SULFATE 1 MG/ML IJ SOLN
INTRAMUSCULAR | Status: AC
  Filled 2021-03-02: qty 1

## 2021-03-02 MED ORDER — LACTATED RINGERS IV BOLUS
1000.0000 mL | Freq: Once | INTRAVENOUS | Status: AC
  Administered 2021-03-02: 1000 mL via INTRAVENOUS

## 2021-03-02 MED ORDER — OXYCODONE-ACETAMINOPHEN 5-325 MG PO TABS: 2.0000 | ORAL_TABLET | ORAL | Status: DC | PRN

## 2021-03-02 MED ORDER — FENTANYL-BUPIVACAINE-NACL 0.5-0.125-0.9 MG/250ML-% EP SOLN: 12.0000 mL/h | EPIDURAL | Status: DC | PRN

## 2021-03-02 MED ORDER — LACTATED RINGERS IV SOLN
500.0000 mL | INTRAVENOUS | Status: DC | PRN
  Administered 2021-03-02: 500 mL via INTRAVENOUS

## 2021-03-02 MED ORDER — DIPHENHYDRAMINE HCL 50 MG/ML IJ SOLN: 12.5000 mg | INTRAMUSCULAR | Status: DC | PRN

## 2021-03-02 MED ORDER — FENTANYL CITRATE (PF) 100 MCG/2ML IJ SOLN
50.0000 ug | INTRAMUSCULAR | Status: DC | PRN
  Administered 2021-03-02 – 2021-03-03 (×6): 100 ug via INTRAVENOUS
  Filled 2021-03-02 (×6): qty 2

## 2021-03-02 MED ORDER — OXYTOCIN BOLUS FROM INFUSION: 333.0000 mL | Freq: Once | INTRAVENOUS | Status: DC

## 2021-03-02 NOTE — MAU Provider Note (Signed)
S: Ms. Jessica Lutz is a 36 y.o. [phone] at [redacted]w[redacted]d  who presents to MAU today with a strong urge to push. Has been having contractions since last night, today they have been more crampy and now has constant vaginal pain and pressure that makes her want to push against it. She denies vaginal bleeding. She denies LOF. She reports normal fetal movement. States this happened in her last pregnancy, she presented at 34 weeks, was 5cm then labor calmed and she made it to 36wks.  Receives care at [facility], prenatal records reviewed.  O: BP (!) 143/82 (BP Location: Right Arm)    Pulse (!) 101    Temp 98.4 F (36.9 C) (Oral)    Resp (!) 23    LMP 06/30/2020 (Within Days)    SpO2 100%  GENERAL: Well-developed, well-nourished female in no acute distress.  HEAD: Normocephalic, atraumatic.  CHEST: Normal effort of breathing, regular heart rate ABDOMEN: Soft, nontender, gravid  Cervical exam:  Dilation: 5 Effacement (%): 60, 70 Station: -1 Presentation: Vertex Exam by:: Edd Arbour, [name]  Fetal Monitoring: reactive Baseline: 145 Variability: moderate Accelerations: 15x15 Decelerations: none Contractions: q2-76min  A: SIUP at [redacted]w[redacted]d  Active labor  P: Report called to L&D First Call, Evalina Field, MD who accepted admission. - Explained to patient that we will not augment labor, she may be discharged if labor calms, pt expressed understanding. L&D assumed care of patient at 10:15am  Bernerd Limbo, [name] 03/02/2021 10:23 AM

## 2021-03-02 NOTE — Progress Notes (Signed)
Jessica Lutz is a 36 y.o. [id number] at [redacted]w[redacted]d here for preterm contractions.   Patient was previously evaluated and monitored for preterm contractions today and her contractions spaced out with Procardia and her cervix remained at 5 cm trhoughout the day. She was being discharged but then as she was getting ready to leave she started having contractions again.    Evaluated patient at bedside. Reports she is having 10/10 lower abdominal cramping. Also reports vaginal pressure.  Cervix checked: 5/70-80/-1  A/P:  Concern for return of preterm contractions.   -Placed patient back on monitor -Procardia and Terbutaline given for tocolysis -Fetal tracing Cat I -IV Fentanyl given to patient for pain relief  Will monitor on L&D at this time and assess for signs of progression of preterm labor - NICU consult placed and neonatologist spoke with patient

## 2021-03-02 NOTE — MAU Note (Addendum)
...  Jessica Lutz is a 36 y.o. at [redacted]w[redacted]d here in MAU reporting: Patient checked into MAU with an urge to push. Patient pulled to room 129 immediately. CTX since yesterday evening. She states she feels like they are more "crampy" today and her pain is constant. She is feeling constant vaginal pain and pressure. +FM. No VB or LOF.   Edd Arbour, [name] at bedside immediately.  Pain score: 8/10 lower abdominal   FHT: 145 initial external Lab orders placed from triage: UA

## 2021-03-02 NOTE — Discharge Summary (Signed)
Postpartum Discharge Summary   Patient Name: TONNIA BARDIN DOB: March 17, 1985 MRN: [id number]  Date of admission: 03/02/2021 Date of discharge: 03/02/2021  Admitting diagnosis: Preterm contractions [O47.00] Intrauterine pregnancy: [redacted]w[redacted]d     Secondary diagnosis:  Principal Problem:   Preterm labor Active Problems:   Obesity in pregnancy   Alpha thalassemia silent carrier   History of preterm labor   Supervision of high-risk pregnancy   History of pre-eclampsia   Short interval between pregnancies affecting pregnancy, antepartum   Late prenatal care affecting pregnancy in second trimester   GDM (gestational diabetes mellitus)   Elevated BP without diagnosis of hypertension  Additional problems: None     Discharge diagnosis:  Preterm labor                                                Hospital course:  Patient was admitted on 03/02/21 with painful contractions and advanced cervical dilation to 5 cm. She was transferred to labor and delivery for monitoring. She received IV fluids, IV Fentanyl for pain control, and 3 doses of Procardia. Her contractions subsequently spaced and her pain subsided. Upon recheck at 10 hours, patient's cervix remained unchanged at 5/80/-2. Given improvement in her symptoms and lack of ongoing cervical change, patient was deemed stable for discharge with return precautions.  Of note, patient had one elevated blood pressure upon admission. Her CBC and CMP were unremarkable. Her urine protein to creatinine ratio was 0.33. Patient did not have another elevated BP while admitted. Urine sample was a clean catch after cervical exams; discussed that this may have affected sample. Advised patient to continue to monitor her blood pressures and call office if they are persistently elevated (>140/90) or if she develops signs/symptoms of pre-eclampsia. Patient voiced understanding. Office provider also updated on plan of care.   Physical exam  Vitals:   03/02/21 1443  03/02/21 1614 03/02/21 1800 03/02/21 1921  BP:  115/69 120/71 132/70  Pulse:  99 (!) 113 (!) 106  Resp:  $Remo'18 18 19  'kLXqP$ Temp: 98.6 F (37 C) 98 F (36.7 C)  98.5 F (36.9 C)  TempSrc: Oral Oral  Oral  SpO2:      Weight:      Height:       General: Alert, cooperative, no acute distress Resp: Normal work of breathing on room air  CV: Normal rate, warm and well perfused  Abd: Soft, nontender, gravid  DVT Evaluation: No LE edema or calf tenderness to palpation   Dilation: 5 Effacement (%): 80 Station: -2 Presentation: Vertex Exam by:: Dr. Gwenlyn Perking   Labs: Lab Results  Component Value Date   WBC 8.8 03/02/2021   HGB 11.5 (L) 03/02/2021   HCT 36.2 03/02/2021   MCV 79.4 (L) 03/02/2021   PLT 292 03/02/2021   CMP Latest Ref Rng & Units 03/02/2021  Glucose 70 - 99 mg/dL 89  BUN 6 - 20 mg/dL <5(L)  Creatinine 0.44 - 1.00 mg/dL 0.70  Sodium 135 - 145 mmol/L 134(L)  Potassium 3.5 - 5.1 mmol/L 4.2  Chloride 98 - 111 mmol/L 106  CO2 22 - 32 mmol/L 17(L)  Calcium 8.9 - 10.3 mg/dL 9.5  Total Protein 6.5 - 8.1 g/dL 6.6  Total Bilirubin 0.3 - 1.2 mg/dL 0.9  Alkaline Phos 38 - 126 U/L 169(H)  AST 15 - 41 U/L 26  ALT 0 - 44 U/L 12    After visit meds:  Allergies as of 03/02/2021   No Known Allergies      Medication List     TAKE these medications    Accu-Chek Guide w/Device Kit 1 Device by Does not apply route as needed.   Accu-Chek Softclix Lancets lancets Use as instructed   aspirin EC 81 MG tablet Take 1 tablet (81 mg total) by mouth daily. Swallow whole.   metFORMIN 500 MG tablet Commonly known as: GLUCOPHAGE Take 1 tablet (500 mg total) by mouth daily before supper.   Prenatal Plus/Iron 27-1 MG Tabs Take 1 tablet by mouth daily.       Discharge: Home/self-care in stable condition.   Future Appointments: Future Appointments  Date Time Provider [place]  03/06/2021  3:15 PM [facility] NST [facility]  03/13/2021  2:15 PM [facility] NST [facility]   03/13/2021  3:55 PM Donnamae Jude, MD [facility]  03/20/2021  3:55 PM Chancy Milroy, MD [facility]  03/21/2021  3:30 PM [facility] NURSE [facility]  03/21/2021  3:45 PM [facility] US5 [facility]  03/26/2021  3:55 PM Clarnce Flock, MD [facility]  04/02/2021  3:55 PM Woodroe Mode, MD [facility]   Follow up Visit: Patient to follow up for next prenatal visit as scheduled above. Strict return precautions reviewed and patient voiced understanding.   03/02/2021 Genia Del, MD

## 2021-03-02 NOTE — Discharge Instructions (Addendum)
Return for your next prenatal visit as scheduled.   Come to MAU if your contractions return or pain worsens.   Let us know if you blood pressures are persistently elevated > 140/90.

## 2021-03-02 NOTE — Consult Note (Addendum)
° °  Consultation Service: Neonatology   Dr. Renard Matter has asked for consultation on Margot Chimes regarding the care of a premature infant at 35. Thank you for inviting [place] to see this patient.   Reason for consult:  Explain the possible complications, the prognosis, and the care of a premature infant at 59 and 0/7 weeks.  Chief complaint: 36 y.o. female with a singleton female IUP with an estimated weight of 1977g grams (last growth at [redacted]w[redacted]d. Pregnancy has been complicated by  preterm labor .  Plan is for delivery via caesarean section/vaginal delivery if possible.  My key findings of this patient's HPI are:  I have reviewed the patient's chart and have met with her. The salient information is as follows:   Prenatal labs:   Prenatal care:   late Pregnancy complications:  GDMA2 (Metformin), Maternal obesity, Alpha thalassemia silent carrier, Hx of pre-eclampsia, Short interval pregnancies  Maternal antibiotics: This patient's mother is not on file. Maternal Steroids: none Most recent dose:  none    My recommendations for this patient and my actions included:   1. In the presence of the [employer]and her mother, I spent 15 minutes discussing the possible complications and outcomes of prematurity at this gestational age. I discussed specific complications at this gestational age referencing the need for IV fluids pending establishment of enteral feeds (encouraged breast milk feeding), gavage tube feeds, temperature support, and monitoring. I discussed this with parents in detail and they expressed an understanding of the risks and complications of prematurity.   2. I also discussed the expected survival of an infant born at 341w which is (excellent). We further discussed that (very few) of the neonates born at this age have profound or severe neurological complications and school difficulties. In addition, (Very Few) of the neonates born at this age will have some for of mild to  moderate neurological complications. She expressed an understanding of this information.   3. I informed her that the NICU team would be present at the delivery. She agreed that all appropriate medical measures could be taken to resuscitate her infant at the delivery.  Final Impression:  36y.o. female with a 369week IUP who is threatening to deliver and who now understands the possible complications and prognosis of her infant. The mother agrees with plan for resuscitation and ICU care. Zilda J Espinal's questions were answered. She is planning to try and provide breast milk for her infant.    ______________________________________________________________________  Thank you for asking [place] participate in the care of this patient. Please do not hesitate to contact [email or web address] if you are aware of any further ways we can be of assistance.   Sincerely,  CEdman Circle MD Attending Neonatologist   I spent ~35 minutes in consultation time, of which 15 minutes was spent in direct face to face counseling.

## 2021-03-02 NOTE — H&P (Signed)
OBSTETRIC ADMISSION HISTORY AND PHYSICAL  Jessica Lutz is a 36 y.o. female [phone] with IUP at 26w0dby 12 week [place] contractions. They started last evening and got worse this morning. She feels contractions/cramping every few minutes. She reports +FMs, no LOF, no VB, no blurry vision, headaches, peripheral edema, or RUQ pain. Her cervix was dilated to 5 cm in MAU, therefore, she was sent to L&D for further monitoring.   [facility]  Dating: By [place]--->  Estimated Date of Delivery: 04/13/21  Sono:   _0 , normal anatomy, cephalic presentation, fundal placental lie, 1977 g, 40% EFW  Prenatal History/Complications:  GDMA2 (Metformin) Maternal obesity  Alpha thalassemia silent carrier  Hx of pre-eclampsia  Short interval pregnancies  Late prenatal care   Past Medical History: Past Medical History:  Diagnosis Date   Anemia 03/22/2018   Obesity affecting pregnancy 05/29/2019   Recommendations _1  Aspirin 81 mg daily after 12 weeks; discontinue after 36 weeks _2  Nutrition consult _3  Weight gain 11-20 lbs for singleton and 25-35 lbs for twin pregnancy (IOM guidelines) Higher class of obesity patients recommended to gain closer to lower limit  Weight loss is associated with adverse outcomes _4  Screen for DM with A1C or early 2 hr GTT _5  Baseline and surveillance labs (   Positive GBS test 11/09/2019   Pre-eclampsia, mild 11/25/2019   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   Pseudodeficiency variant detected for Tay-Sachs Not a variant of concern Genetic Counseling: on 07/20/2019 _6 partner testing   Supervision of low-risk pregnancy 05/29/2019    Nursing Staff Provider Office Location  [facility] Dating  LMP Language   English Anatomy [place] Normal - incomplete - FU 7/7 - normal Flu Vaccine  Declined-06/01/19 Genetic Screen  NIPS: LR  AFP:  Not performed   TDaP vaccine   09-13-19  Hgb A1C or  GTT Early 5.7 Third trimester WNL  Glucose,  Fasting 65 - 91 mg/dL 88  Glucose, 1 hour 65 - 179 mg/dL 166  Glucose, 2 hour 65 - 152 mg/dL 124   Rhogam  NA       Past Surgical History: Past Surgical History:  Procedure Laterality Date   NO PAST SURGERIES      Obstetrical History: OB History     Gravida  5   Para  2   Term  2   Preterm  0   AB  2   Living  2      SAB  2   IAB  0   Ectopic  0   Multiple  0   Live Births  2           Social History Social History   Socioeconomic History   Marital status: Single    Spouse name: Not on file   Number of children: Not on file   Years of education: Not on file   Highest education level: Not on file  Occupational History   Not on file  Tobacco Use   Smoking status: Never   Smokeless tobacco: Never  Vaping Use   Vaping Use: Never used  Substance and Sexual Activity   Alcohol use: Not Currently    Comment: occ   Drug use: Not Currently    Types: Marijuana    Comment: last used 3 wks ago from 07/18/19   Sexual activity: Yes    Birth control/protection: None  Other Topics Concern   Not on  file  Social History Narrative   Not on file   Social Determinants of Health   Financial Resource Strain: Not on file  Food Insecurity: No Food Insecurity   Worried About Charity fundraiser in the Last Year: Never true   Ran Out of Food in the Last Year: Never true  Transportation Needs: No Transportation Needs   Lack of Transportation (Medical): No   Lack of Transportation (Non-Medical): No  Physical Activity: Not on file  Stress: Not on file  Social Connections: Not on file    Family History: Family History  Problem Relation Age of Onset   Thyroid disease Mother    Alcohol abuse Neg Hx     Allergies: No Known Allergies  Medications Prior to Admission  Medication Sig Dispense Refill Last Dose   Accu-Chek Softclix Lancets lancets Use as instructed 100 each 12    aspirin EC 81 MG tablet Take 1 tablet (81 mg total) by mouth daily. Swallow whole.  30 tablet 11    Blood Glucose Monitoring Suppl (ACCU-CHEK GUIDE) w/Device KIT 1 Device by Does not apply route as needed. 1 kit 0    metFORMIN (GLUCOPHAGE) 500 MG tablet Take 1 tablet (500 mg total) by mouth daily before supper. (Patient not taking: Reported on 02/27/2021) 60 tablet 5    Prenatal Vit-Fe Fumarate-FA (PRENATAL PLUS/IRON) 27-1 MG TABS Take 1 tablet by mouth daily. 30 tablet 11      Review of Systems  All systems reviewed and negative except as stated in HPI  Blood pressure (!) 143/82, pulse (!) 101, temperature 98.4 F (36.9 C), temperature source Oral, resp. rate (!) 23, last menstrual period 06/30/2020, SpO2 100 %, not currently breastfeeding.  General appearance: alert, cooperative, and no distress Lungs: normal work of breathing on room air  Heart: normal rate, warm and well perfused  Abdomen: soft, non-tender, gravid  Extremities: no LE edema or calf tenderness to palpation   Presentation:  Cephalic Fetal monitoring: Baseline 140 bpm, moderate variability, + accels, no decels  Uterine activity: Contractions every 2-3 minutes Dilation: 5.5 Effacement (%): 70 Station: -2 Exam by:: Dr. Gwenlyn Perking   Prenatal labs: ABO, Rh: A/Positive/-- (09/14 2800) Antibody: Negative (09/14 0953) Rubella: 2.19 (09/14 0953) RPR: Non Reactive (12/09 0855)  HBsAg: Negative (09/14 0953)  HIV: Non Reactive (12/09 0855)  GBS:  Unknown  2 hr Glucola - Abnormal (fasting 97) Genetic screening - LR NIPS, AFP neg, alpha thalassemia silent carrier  Anatomy US normal   Prenatal Transfer Tool  Maternal Diabetes: Yes:  Diabetes Type:  Insulin/Medication controlled Genetic Screening: LR NIPS, AFP neg, alpha thalassemia silent carrier  Maternal Ultrasounds/Referrals: Normal Fetal Ultrasounds or other Referrals:  None Maternal Substance Abuse:  No Significant Maternal Medications:  Metformin, ASA Significant Maternal Lab Results: None  No results found for this or any previous visit (from  the past 24 hour(s)).  Patient Active Problem List   Diagnosis Date Noted   GDM (gestational diabetes mellitus) 01/20/2021   History of pre-eclampsia 10/23/2020   Short interval between pregnancies affecting pregnancy, antepartum 10/23/2020   BMI 37.0-37.9, adult 10/23/2020   Late prenatal care affecting pregnancy in second trimester 10/23/2020   Polycystic ovarian syndrome 08/26/2020   Supervision of high-risk pregnancy 11/24/2019   History of preterm labor 11/08/2019   Alpha thalassemia silent carrier 06/30/2019   Obesity in pregnancy 05/29/2019    Assessment/Plan:  Jessica Lutz is a 36 y.o. [id number] at 53w0dhere for monitoring due to preterm labor.   #  Labor: Contracting painfully every 2-3 minutes. Cervix remains 5-6 cm dilated upon recheck. Will trial 3 doses of Procardia to slow contractions if able. Discussed possibility of preterm delivery with patient if contractions persist. Patient voiced understanding. Discussed plan of care with Dr. Kennon Rounds who agrees with management.  #Pain: PRN; IV Fentanyl for now #FWB: Cat 1 #ID:  GBS unknown; Ampicillin ordered #MOF: Bottle  #MOC: Considering Nexplanon   #GDMA2: Will continue CBGs every 4 hours, SSI as needed.   #Elevated BP: Hx of pre-eclampsia in prior pregnancy. Mild range BP on admit, no symptoms. Will check CBC, CMP, UPC and continue to monitor closely.   Genia Del, MD  03/02/2021, 10:14 AM

## 2021-03-02 NOTE — Progress Notes (Addendum)
Labor Progress Note  Jessica Lutz is a 36 y.o. [id number] at [redacted]w[redacted]d presented for preterm contractions.   S: Contraction pain improving with IV Fentanyl. Also reports that they are more spaced out since treatment with Procardia, fluids, and pain medication. Wondering if she can eat something and how long she will need to be monitored. No other concerns at this time.  O:  BP 120/62    Pulse (!) 104    Temp 98.6 F (37 C) (Oral)    Resp 18    Ht 5\' 7"  (1.702 m)    Wt 108 kg    LMP 06/30/2020 (Within Days)    SpO2 100%    BMI 37.28 kg/m   EFM: Baseline 135 bpm, moderate variability, + accels, no decels  Toco: Contractions every 4-7 minutes   CVE: Dilation: 5.5 Effacement (%): 70 Station: -2 Presentation: Vertex Exam by:: Dr. [email or web address]   A&P: 36 y.o. 20 [redacted]w[redacted]d   #Labor: Stable. Contractions spacing some and pain controlled with IV Fentanyl. Discussed plan to recheck around 19:30, unless she feels more pain or pressure prior. Will reassess whether patient needs to be monitored further on L&D versus transfer to [facility] pending clinical status. Discussed that she can eat a light meal now. All questions and concerns addressed and patient voiced understanding.  #Pain: IV Fentanyl  #FWB: Cat 1  #GBS negative per PCR, no culture data thus far. Given preterm and ongoing contractions, will continue Ampicillin.  #GDMA2: CBGs within normal rage. Will continue to monitor every 4 hours.   #Elevated BP: Solely elevated on admit BP. Pressures since then all within normal range. LFTs and platelets normal. UPC 0.33, which is elevated. Will continue to monitor. If patient has another elevated BP, 4 hours from prior BP, will meet criteria for pre-eclampsia without severe features. Remains asymptomatic currently.   [facility], MD 3:35 PM

## 2021-03-03 DIAGNOSIS — O4703 False labor before 37 completed weeks of gestation, third trimester: Secondary | ICD-10-CM | POA: Diagnosis not present

## 2021-03-03 LAB — URINALYSIS, ROUTINE W REFLEX MICROSCOPIC
Bilirubin Urine: NEGATIVE
Glucose, UA: NEGATIVE mg/dL
Ketones, ur: 80 mg/dL — AB
Nitrite: NEGATIVE
Protein, ur: NEGATIVE mg/dL
Specific Gravity, Urine: 1.011 (ref 1.005–1.030)
WBC, UA: >50 WBC/hpf — ABNORMAL HIGH (ref 0–5)
pH: 7 (ref 5.0–8.0)

## 2021-03-03 LAB — GLUCOSE, CAPILLARY
Glucose-Capillary: 112 mg/dL — ABNORMAL HIGH (ref 70–99)
Glucose-Capillary: 99 mg/dL (ref 70–99)

## 2021-03-03 LAB — RPR: RPR Ser Ql: NONREACTIVE

## 2021-03-03 MED ORDER — NIFEDIPINE 10 MG PO CAPS
10.0000 mg | ORAL_CAPSULE | ORAL | Status: AC
  Administered 2021-03-03: 10 mg via ORAL
  Filled 2021-03-03: qty 1

## 2021-03-03 MED ORDER — NIFEDIPINE 10 MG PO CAPS: 10.0000 mg | ORAL_CAPSULE | Freq: Three times a day (TID) | ORAL | 0 refills | Status: DC

## 2021-03-03 NOTE — Discharge Summary (Signed)
Discharge Summary      Patient Name: Jessica Lutz DOB: 12/08/1985 MRN: [id number]  Date of admission: 03/02/2021 Delivery date:This patient has no babies on file. Delivering provider: This patient has no babies on file. Date of discharge: 03/03/2021  Admitting diagnosis: Preterm contractions [O47.00] Intrauterine pregnancy: [redacted]w[redacted]d     Secondary diagnosis:  Principal Problem:   Preterm labor Active Problems:   Obesity in pregnancy   Alpha thalassemia silent carrier   History of preterm labor   Supervision of high-risk pregnancy   History of pre-eclampsia   Short interval between pregnancies affecting pregnancy, antepartum   Late prenatal care affecting pregnancy in second trimester   GDM (gestational diabetes mellitus)   Elevated BP without diagnosis of hypertension  Additional problems: None     Discharge diagnosis:  Preterm labor                                                 Hospital course:  Patient presented to MAU on 03/02/21 with painful contractions and advanced cervical dilation to 5 cm. She was transferred to labor and delivery for monitoring. She received IV fluids, IV Fentanyl for pain control, and 3 doses of Procardia. Her contractions subsequently spaced and her pain subsided. Upon recheck at 10 hours, patient's cervix remained unchanged at 5/80/-2. Given improvement in her symptoms and lack of ongoing cervical change, patient was deemed stable for discharge with return precautions.  As she was getting ready to be discharged patient started having 10/10 lower abdominal cramping and pain again. Therefore her discharge was cancelled and she was given two more doses of Procardia as well as a dose of terbutaline. On the monitor she did not have frequent contractions. However patient felt intense pressure. She also received IV Fentanyl for pain control. At that time her cervix remained 5/60-70/-2. She was then monitored overnight and her contractions remained sporadic  on the monitor (every 8-10 minutes) and she symptomatically felt better with the above interventions. Her cramping also significantly improved after she received a heat pack that she used for her lower abdomen and groin. She rated her discomfort a 1/10 and her cervix was checked after 8 hours and it continued to be unchanged at 5/60-70/-2. At that time patient was deemed stable for discharge and was sent Procardia $RemoveBefore'10mg'[id number]$  q8hr for preterm contractions. She was also counseled on using heating pad and belly band for her lower abdominal pain/pressure and presumed round ligament pain.    Of note, patient had one elevated blood pressure upon admission. Her CBC and CMP were unremarkable. Her urine protein to creatinine ratio was 0.33. Patient did not have another elevated BP while admitted. Urine sample was a clean catch after cervical exams; discussed that this may have affected sample. Advised patient to continue to monitor her blood pressures and call office if they are persistently elevated (>140/90) or if she develops signs/symptoms of pre-eclampsia. Patient voiced understanding.  Discussed plan in detail with Dr. Elonda Husky who is in agreement.  Physical exam  Vitals:   03/03/21 0502 03/03/21 0531 03/03/21 0601 03/03/21 0642  BP: 101/67 106/64 (!) 95/53 106/71  Pulse: 91 (!) 104 93 93  Resp:    19  Temp:    98.1 F (36.7 C)  TempSrc:    Oral  SpO2:      Weight:  Height:       General: alert, appears comfortable and resting in bed Heart: regular rate Lungs: breathing comfortably in room air Abdomen: mild TTP over groin, no tightening of fundus Labs: Lab Results  Component Value Date   WBC 10.4 03/02/2021   HGB 11.2 (L) 03/02/2021   HCT 33.6 (L) 03/02/2021   MCV 78.1 (L) 03/02/2021   PLT 304 03/02/2021   CMP Latest Ref Rng & Units 03/02/2021  Glucose 70 - 99 mg/dL 94  BUN 6 - 20 mg/dL 5(L)  Creatinine 0.44 - 1.00 mg/dL 0.60  Sodium 135 - 145 mmol/L 136  Potassium 3.5 - 5.1 mmol/L 3.9   Chloride 98 - 111 mmol/L 105  CO2 22 - 32 mmol/L 18(L)  Calcium 8.9 - 10.3 mg/dL 9.6  Total Protein 6.5 - 8.1 g/dL 6.2(L)  Total Bilirubin 0.3 - 1.2 mg/dL 0.8  Alkaline Phos 38 - 126 U/L 172(H)  AST 15 - 41 U/L 20  ALT 0 - 44 U/L 10   Edinburgh Score: Edinburgh Postnatal Depression Scale Screening Tool 01/01/2020  I have been able to laugh and see the funny side of things. 0  I have looked forward with enjoyment to things. 0  I have blamed myself unnecessarily when things went wrong. 0  I have been anxious or worried for no good reason. 0  I have felt scared or panicky for no good reason. 0  Things have been getting on top of me. 2  I have been so unhappy that I have had difficulty sleeping. 0  I have felt sad or miserable. 0  I have been so unhappy that I have been crying. 0  The thought of harming myself has occurred to me. 0  Edinburgh Postnatal Depression Scale Total 2     After visit meds:  Allergies as of 03/03/2021   No Known Allergies      Medication List     TAKE these medications    Accu-Chek Guide w/Device Kit 1 Device by Does not apply route as needed.   Accu-Chek Softclix Lancets lancets Use as instructed   aspirin EC 81 MG tablet Take 1 tablet (81 mg total) by mouth daily. Swallow whole.   metFORMIN 500 MG tablet Commonly known as: GLUCOPHAGE Take 1 tablet (500 mg total) by mouth daily before supper.   NIFEdipine 10 MG capsule Commonly known as: PROCARDIA Take 1 capsule (10 mg total) by mouth every 8 (eight) hours for 20 days.   Prenatal Plus/Iron 27-1 MG Tabs Take 1 tablet by mouth daily.         Discharge home in stable condition Future Appointments: Future Appointments  Date Time Provider [place]  03/06/2021  3:15 PM [facility] NST [facility]  03/13/2021  2:15 PM [facility] NST [facility]  03/13/2021  3:55 PM Donnamae Jude, MD [facility]  03/20/2021  3:55 PM Chancy Milroy, MD [facility]  03/21/2021  3:30 PM [facility] NURSE  [facility]  03/21/2021  3:45 PM [facility] US5 [facility]  03/26/2021  3:55 PM Clarnce Flock, MD [facility]  04/02/2021  3:55 PM Woodroe Mode, MD [facility]     03/03/2021 Renard Matter, MD

## 2021-03-03 NOTE — Progress Notes (Signed)
Jessica Lutz is a 36 y.o. [id number] at [redacted]w[redacted]d admitted for preterm contractions  Patient now feeling better. Resting with eyes closed. No longer feeling contractions. Pressure has improved. Denies bleeding   Objective: BP (!) 97/48 Comment: patient side lying   Pulse 100    Temp 98.2 F (36.8 C) (Oral)    Resp 17    Ht 5\' 7"  (1.702 m)    Wt 108 kg    LMP 06/30/2020 (Within Days)    SpO2 100%    BMI 37.28 kg/m  No intake/output data recorded. No intake/output data recorded.   FHT:  FHR: 140 bpm, variability: moderate,  accelerations:  Present,  decelerations:  Absent UC:   irregular  SVE:   not checked during this encounter as patient feeling better and not feeling pressure   Labs: Lab Results  Component Value Date   WBC 10.4 03/02/2021   HGB 11.2 (L) 03/02/2021   HCT 33.6 (L) 03/02/2021   MCV 78.1 (L) 03/02/2021   PLT 304 03/02/2021    Assessment / Plan: 03/04/2021 at [redacted]w[redacted]d admitted for preterm contractions  Patient much more comfortable and no longer having consistent cramping and contractions.Will continue to monitor overnight. If clinical status changes with frequent contractions again or  constant lower pelvic pressure can check cervix to assess for labor. Otherwise, will hold off on check until AM.    [redacted]w[redacted]d 03/03/2021, 12:28 AM

## 2021-03-04 ENCOUNTER — Encounter: Payer: Self-pay | Admitting: Family Medicine

## 2021-03-04 ENCOUNTER — Telehealth: Payer: Self-pay

## 2021-03-04 LAB — CULTURE, OB URINE: Culture: NO GROWTH

## 2021-03-04 NOTE — Telephone Encounter (Signed)
Transition Care Management Unsuccessful Follow-up Telephone Call ° °Date of discharge and from where:  03/03/2021 from [facility] ° °Attempts:  1st Attempt ° °Reason for unsuccessful TCM follow-up call:  Left voice message ° ° ° °

## 2021-03-05 NOTE — Telephone Encounter (Signed)
Transition Care Management Unsuccessful Follow-up Telephone Call  Date of discharge and from where:  03/03/2021 from [facility]  Attempts:  2nd Attempt  Reason for unsuccessful TCM follow-up call:  Left voice message  .

## 2021-03-06 ENCOUNTER — Other Ambulatory Visit: Payer: Self-pay

## 2021-03-06 ENCOUNTER — Ambulatory Visit (INDEPENDENT_AMBULATORY_CARE_PROVIDER_SITE_OTHER): Payer: Medicaid Other | Admitting: General Practice

## 2021-03-06 ENCOUNTER — Ambulatory Visit (INDEPENDENT_AMBULATORY_CARE_PROVIDER_SITE_OTHER): Payer: Medicaid Other

## 2021-03-06 VITALS — BP 120/74 | HR 100

## 2021-03-06 DIAGNOSIS — O0993 Supervision of high risk pregnancy, unspecified, third trimester: Secondary | ICD-10-CM

## 2021-03-06 DIAGNOSIS — O24415 Gestational diabetes mellitus in pregnancy, controlled by oral hypoglycemic drugs: Secondary | ICD-10-CM

## 2021-03-06 MED ORDER — COMFORT FIT MATERNITY SUPP LG MISC: 1.0000 | Freq: Once | 0 refills | Status: AC

## 2021-03-06 NOTE — Progress Notes (Signed)
Pt informed that the ultrasound is considered a limited OB ultrasound and is not intended to be a complete ultrasound exam.  Patient also informed that the ultrasound is not being completed with the intent of assessing for fetal or placental anomalies or any pelvic abnormalities.  Explained that the purpose of today’s ultrasound is to assess for  BPP, presentation, and AFI.  Patient acknowledges the purpose of the exam and the limitations of the study.    ° °Jessica Lutz H RN BSN °03/06/21 ° °

## 2021-03-07 NOTE — Telephone Encounter (Signed)
Transition Care Management Unsuccessful Follow-up Telephone Call ° °Date of discharge and from where:  03/03/2021 from [facility] ° °Attempts:  3rd Attempt ° °Reason for unsuccessful TCM follow-up call:  Unable to reach patient ° ° ° °

## 2021-03-08 NOTE — Progress Notes (Signed)
Patient seen and assessed by nursing staff.  Agree with documentation and plan.  NST:  Baseline: 125 bpm, Variability: Good {> 6 bpm), Accelerations: Reactive, and Decelerations: Absent   

## 2021-03-13 ENCOUNTER — Other Ambulatory Visit: Payer: Self-pay

## 2021-03-13 ENCOUNTER — Ambulatory Visit (INDEPENDENT_AMBULATORY_CARE_PROVIDER_SITE_OTHER): Payer: Medicaid Other

## 2021-03-13 ENCOUNTER — Inpatient Hospital Stay (HOSPITAL_COMMUNITY)
Admit: 2021-03-13 | Discharge: 2021-03-13 | Disposition: A | Discharge status: Home / Self Care | Payer: Medicaid Other | Attending: Obstetrics and Gynecology | Admitting: Obstetrics and Gynecology

## 2021-03-13 ENCOUNTER — Ambulatory Visit (INDEPENDENT_AMBULATORY_CARE_PROVIDER_SITE_OTHER): Payer: Medicaid Other | Admitting: General Practice

## 2021-03-13 ENCOUNTER — Ambulatory Visit (INDEPENDENT_AMBULATORY_CARE_PROVIDER_SITE_OTHER): Payer: Medicaid Other | Admitting: Family Medicine

## 2021-03-13 ENCOUNTER — Encounter (HOSPITAL_COMMUNITY): Payer: Self-pay | Admitting: Obstetrics and Gynecology

## 2021-03-13 VITALS — BP 126/80 | HR 102 | Wt 240.0 lb

## 2021-03-13 DIAGNOSIS — Z8751 Personal history of pre-term labor: Secondary | ICD-10-CM

## 2021-03-13 DIAGNOSIS — Z3689 Encounter for other specified antenatal screening: Secondary | ICD-10-CM | POA: Diagnosis not present

## 2021-03-13 DIAGNOSIS — O26893 Other specified pregnancy related conditions, third trimester: Secondary | ICD-10-CM | POA: Diagnosis not present

## 2021-03-13 DIAGNOSIS — O24410 Gestational diabetes mellitus in pregnancy, diet controlled: Secondary | ICD-10-CM

## 2021-03-13 DIAGNOSIS — Z6710 Type A blood, Rh positive: Secondary | ICD-10-CM | POA: Diagnosis not present

## 2021-03-13 DIAGNOSIS — R1030 Lower abdominal pain, unspecified: Secondary | ICD-10-CM | POA: Diagnosis not present

## 2021-03-13 DIAGNOSIS — Z3A35 35 weeks gestation of pregnancy: Secondary | ICD-10-CM | POA: Insufficient documentation

## 2021-03-13 DIAGNOSIS — O0993 Supervision of high risk pregnancy, unspecified, third trimester: Secondary | ICD-10-CM

## 2021-03-13 DIAGNOSIS — N760 Acute vaginitis: Secondary | ICD-10-CM | POA: Diagnosis not present

## 2021-03-13 DIAGNOSIS — O4700 False labor before 37 completed weeks of gestation, unspecified trimester: Secondary | ICD-10-CM

## 2021-03-13 DIAGNOSIS — O24419 Gestational diabetes mellitus in pregnancy, unspecified control: Secondary | ICD-10-CM | POA: Insufficient documentation

## 2021-03-13 DIAGNOSIS — B9689 Other specified bacterial agents as the cause of diseases classified elsewhere: Secondary | ICD-10-CM | POA: Diagnosis not present

## 2021-03-13 DIAGNOSIS — O24415 Gestational diabetes mellitus in pregnancy, controlled by oral hypoglycemic drugs: Secondary | ICD-10-CM

## 2021-03-13 DIAGNOSIS — O09293 Supervision of pregnancy with other poor reproductive or obstetric history, third trimester: Secondary | ICD-10-CM | POA: Insufficient documentation

## 2021-03-13 LAB — URINALYSIS, MICROSCOPIC (REFLEX)

## 2021-03-13 LAB — WET PREP, GENITAL
Sperm: NONE SEEN
Trich, Wet Prep: NONE SEEN
WBC, Wet Prep HPF POC: >=10 — AB (ref <10)
Yeast Wet Prep HPF POC: NONE SEEN

## 2021-03-13 LAB — URINALYSIS, ROUTINE W REFLEX MICROSCOPIC
Bilirubin Urine: NEGATIVE
Glucose, UA: NEGATIVE mg/dL
Hgb urine dipstick: NEGATIVE
Ketones, ur: NEGATIVE mg/dL
Nitrite: NEGATIVE
Protein, ur: NEGATIVE mg/dL
Specific Gravity, Urine: 1.015 (ref 1.005–1.030)
pH: 7.5 (ref 5.0–8.0)

## 2021-03-13 LAB — OB RESULTS CONSOLE GC/CHLAMYDIA: Gonorrhea: NEGATIVE

## 2021-03-13 MED ORDER — SODIUM CHLORIDE 0.9 % IV SOLN
8.0000 mg | Freq: Once | INTRAVENOUS | Status: AC
  Administered 2021-03-13: 8 mg via INTRAVENOUS
  Filled 2021-03-13: qty 4

## 2021-03-13 MED ORDER — METRONIDAZOLE 500 MG PO TABS: 500.0000 mg | ORAL_TABLET | Freq: Two times a day (BID) | ORAL | 0 refills | Status: DC

## 2021-03-13 MED ORDER — LACTATED RINGERS IV BOLUS
1000.0000 mL | Freq: Once | INTRAVENOUS | Status: AC
  Administered 2021-03-13: 1000 mL via INTRAVENOUS

## 2021-03-13 MED ORDER — CYCLOBENZAPRINE HCL 10 MG PO TABS: 10.0000 mg | ORAL_TABLET | Freq: Two times a day (BID) | ORAL | 0 refills | Status: DC | PRN

## 2021-03-13 NOTE — Progress Notes (Addendum)
° °  PRENATAL VISIT NOTE  Subjective:  Jessica Lutz is a 36 y.o. [id number] at [redacted]w[redacted]d being seen today for ongoing prenatal care.  She is currently monitored for the following issues for this high-risk pregnancy and has Obesity in pregnancy; Alpha thalassemia silent carrier; History of preterm labor; Supervision of high-risk pregnancy; Polycystic ovarian syndrome; History of pre-eclampsia; Short interval between pregnancies affecting pregnancy, antepartum; BMI 37.0-37.9, adult; Late prenatal care affecting pregnancy in second trimester; GDM (gestational diabetes mellitus); Preterm labor; and Elevated BP without diagnosis of hypertension on their problem list.  Patient reports contractions since last few weeks, seen at MAU today without cervical change .  Contractions: Irritability. Vag. Bleeding: None.  Movement: Present. Denies leaking of fluid.   The following portions of the patient's history were reviewed and updated as appropriate: allergies, current medications, past family history, past medical history, past social history, past surgical history and problem list.   Objective:   Vitals:   03/13/21 1508  BP: 126/80  Pulse: (!) 102  Weight: 240 lb (108.9 kg)    Fetal Status: Fetal Heart Rate (bpm): NST   Movement: Present     General:  Alert, oriented and cooperative. Patient is in no acute distress.  Skin: Skin is warm and dry. No rash noted.   Cardiovascular: Normal heart rate noted  Respiratory: Normal respiratory effort, no problems with respiration noted  Abdomen: Soft, gravid, appropriate for gestational age.  Pain/Pressure: Present     Pelvic: Cervical exam deferred        Extremities: Normal range of motion.  Edema: None  Mental Status: Normal mood and affect. Normal behavior. Normal judgment and thought content.  NST:  Baseline: 125 bpm, Variability: Good {> 6 bpm), Accelerations: Reactive, and Decelerations: Absent  Assessment and Plan:  Pregnancy: [id number] at [redacted]w[redacted]d 1.  Supervision of high risk pregnancy in third trimester Continue prenatal care.  2. Diet controlled gestational diabetes mellitus (GDM) in third trimester No log--CBGs fluctuate Reports fastings are usually in range--highest was 101 1 time usually < 100 2 hour pp are mostly in range. Sometimes she does not take her metformin. Advised risk of poor glycemic control. Last growth at 40%.  3. History of preterm labor Contractions with advanced cervical dilation, but no cervical change.  Preterm labor symptoms and general obstetric precautions including but not limited to vaginal bleeding, contractions, leaking of fluid and fetal movement were reviewed in detail with the patient. Please refer to After Visit Summary for other counseling recommendations.   Return in 1 week (on 03/20/2021).  Future Appointments  Date Time Provider [place]  03/20/2021  3:55 PM Chancy Milroy, MD [facility]  03/21/2021  3:30 PM [facility] NURSE [facility]  03/21/2021  3:45 PM [facility] US5 [facility]  03/26/2021  3:55 PM Clarnce Flock, MD [facility]  04/02/2021  3:55 PM Woodroe Mode, MD [facility]    Donnamae Jude, MD

## 2021-03-13 NOTE — MAU Note (Addendum)
...  Jessica Lutz is a 36 y.o. at [redacted]w[redacted]d here in MAU reporting: CTX that started 30 minutes ago after lifting her child out of the bathtub. No VB or LOF. +FM.  GBS-  Dr. Dione Plover, MD, at bedside. SVE 5 80 -1 vertex.  Pain score: 8/10 lower abdomen

## 2021-03-13 NOTE — Progress Notes (Signed)
Pt informed that the ultrasound is considered a limited OB ultrasound and is not intended to be a complete ultrasound exam.  Patient also informed that the ultrasound is not being completed with the intent of assessing for fetal or placental anomalies or any pelvic abnormalities.  Explained that the purpose of today’s ultrasound is to assess for  BPP, presentation, and AFI.  Patient acknowledges the purpose of the exam and the limitations of the study.    ° °Krrish Freund H RN BSN °03/13/21 ° °

## 2021-03-13 NOTE — MAU Provider Note (Signed)
History     [id number]  Arrival date and time: 03/13/21 4270    Chief Complaint  Patient presents with   Contractions     HPI Jessica Lutz is a 36 y.o. at [redacted]w[redacted]d with PMHx notable for preterm contractions during this pregnancy, GDMA2, hx of pre-E, who presents for contractions.   Patient was recently admitted from 03/02/21 - 03/03/21 for preterm contractions. She presented with painful contractions and was dilated to 5 cm, she was given IV pain control and procardia and ultimately improved.  Today reports she started to have intense lower abdominal pain, cramping, and contractions about a half hour before presentation Mother is present at bedside and thinks she may have pulled a muscle Patient denies any vaginal bleeding or loss of fluid Normal fetal movement   --/--/A POS (01/22 1013)  OB History     Gravida  5   Para  2   Term  2   Preterm  0   AB  2   Living  2      SAB  2   IAB  0   Ectopic  0   Multiple  0   Live Births  2           Past Medical History:  Diagnosis Date   Anemia 03/22/2018   Obesity affecting pregnancy 05/29/2019   Recommendations $RemoveBeforeDE'[x]'dZywRiHAPPstRGe$  Aspirin 81 mg daily after 12 weeks; discontinue after 36 weeks $Remove'[ ]'KlPbWLE$  Nutrition consult $RemoveBeforeDEI'[ ]'wttSjWjzXzdtFhxk$  Weight gain 11-20 lbs for singleton and 25-35 lbs for twin pregnancy (IOM guidelines) Higher class of obesity patients recommended to gain closer to lower limit  Weight loss is associated with adverse outcomes $RemoveBeforeDE'[ ]'iBOePaGJCvdIGUt$  Screen for DM with A1C or early 2 hr GTT $Remo'[ ]'jMyhX$  Baseline and surveillance labs (   Positive GBS test 11/09/2019   Pre-eclampsia, mild 11/25/2019   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   Pseudodeficiency variant detected for Tay-Sachs Not a variant of concern Genetic Counseling: on 07/20/2019 $RemoveBefor'[]'oAwLFpCmATfu$ partner testing   Supervision of low-risk pregnancy 05/29/2019    Nursing Staff Provider Office Location  [facility] Dating  LMP Language   English Anatomy US  Normal - incomplete - FU 7/7 - normal  Flu Vaccine  Declined-06/01/19 Genetic Screen  NIPS: LR  AFP:  Not performed   TDaP vaccine   09-13-19  Hgb A1C or  GTT Early 5.7 Third trimester WNL  Glucose, Fasting 65 - 91 mg/dL 88  Glucose, 1 hour 65 - 179 mg/dL 166  Glucose, 2 hour 65 - 152 mg/dL 124   Rhogam  NA       Past Surgical History:  Procedure Laterality Date   NO PAST SURGERIES      Family History  Problem Relation Age of Onset   Thyroid disease Mother    Alcohol abuse Neg Hx     Social History   Socioeconomic History   Marital status: Single    Spouse name: Not on file   Number of children: Not on file   Years of education: Not on file   Highest education level: Not on file  Occupational History   Not on file  Tobacco Use   Smoking status: Never   Smokeless tobacco: Never  Vaping Use   Vaping Use: Never used  Substance and Sexual Activity   Alcohol use: Not Currently    Comment: occ   Drug use: Not Currently    Types: Marijuana    Comment: last used 3 wks ago from 07/18/19  Sexual activity: Yes    Birth control/protection: None  Other Topics Concern   Not on file  Social History Narrative   Not on file   Social Determinants of Health   Financial Resource Strain: Not on file  Food Insecurity: No Food Insecurity   Worried About Charity fundraiser in the Last Year: Never true   Ran Out of Food in the Last Year: Never true  Transportation Needs: No Transportation Needs   Lack of Transportation (Medical): No   Lack of Transportation (Non-Medical): No  Physical Activity: Not on file  Stress: Not on file  Social Connections: Not on file  Intimate Partner Violence: Not on file    No Known Allergies  No current facility-administered medications on file prior to encounter.   Current Outpatient Medications on File Prior to Encounter  Medication Sig Dispense Refill   Accu-Chek Softclix Lancets lancets Use as instructed 100 each 12   aspirin EC 81 MG tablet Take 1 tablet (81 mg total) by mouth daily.  Swallow whole. 30 tablet 11   Blood Glucose Monitoring Suppl (ACCU-CHEK GUIDE) w/Device KIT 1 Device by Does not apply route as needed. 1 kit 0   metFORMIN (GLUCOPHAGE) 500 MG tablet Take 1 tablet (500 mg total) by mouth daily before supper. (Patient not taking: Reported on 02/27/2021) 60 tablet 5   NIFEdipine (PROCARDIA) 10 MG capsule Take 1 capsule (10 mg total) by mouth every 8 (eight) hours for 20 days. 60 capsule 0   Prenatal Vit-Fe Fumarate-FA (PRENATAL PLUS/IRON) 27-1 MG TABS Take 1 tablet by mouth daily. 30 tablet 11     ROS Pertinent positives and negative per HPI, all others reviewed and negative  Physical Exam   BP 120/78 (BP Location: Right Arm)    Pulse 85    Temp 98 F (36.7 C) (Oral)    Resp 17    LMP 06/30/2020 (Within Days)    SpO2 100%   Patient Vitals for the past 24 hrs:  BP Temp Temp src Pulse Resp SpO2  03/13/21 1133 120/78 -- -- 85 17 100 %  03/13/21 0954 121/84 -- -- 98 -- --  03/13/21 0940 136/78 98 F (36.7 C) Oral 99 -- 100 %    Physical Exam Vitals reviewed.  Constitutional:      Appearance: She is well-developed.     Comments: Appears uncomfortable  Eyes:     General: No scleral icterus. Pulmonary:     Effort: Pulmonary effort is normal. No respiratory distress.  Abdominal:     General: There is no distension.     Palpations: Abdomen is soft.     Tenderness: There is no abdominal tenderness. There is no guarding or rebound.  Skin:    General: Skin is warm and dry.  Neurological:     Mental Status: She is alert.     Coordination: Coordination normal.     Cervical Exam Dilation: 5 Effacement (%): 90 Station: -1 Presentation: Vertex Exam by:: Dr. Dione Plover, MD  Bedside Ultrasound Not done  My interpretation: n/a  FHT Baseline 130, moderate variability, +accels, no decels Toco: irregular contractions Cat: I  Labs Results for orders placed or performed during the hospital encounter of 03/13/21 (from the past 24 hour(s))  Urinalysis,  Routine w reflex microscopic Urine, Clean Catch     Status: Abnormal   Collection Time: 03/13/21 10:05 AM  Result Value Ref Range   Color, Urine YELLOW YELLOW   APPearance CLEAR CLEAR   Specific Gravity, Urine  1.015 1.005 - 1.030   pH 7.5 5.0 - 8.0   Glucose, UA NEGATIVE NEGATIVE mg/dL   Hgb urine dipstick NEGATIVE NEGATIVE   Bilirubin Urine NEGATIVE NEGATIVE   Ketones, ur NEGATIVE NEGATIVE mg/dL   Protein, ur NEGATIVE NEGATIVE mg/dL   Nitrite NEGATIVE NEGATIVE   Leukocytes,Ua SMALL (A) NEGATIVE  Urinalysis, Microscopic (reflex)     Status: Abnormal   Collection Time: 03/13/21 10:05 AM  Result Value Ref Range   RBC / HPF 0-5 0 - 5 RBC/hpf   WBC, UA 0-5 0 - 5 WBC/hpf   Bacteria, UA RARE (A) NONE SEEN   Squamous Epithelial / LPF 0-5 0 - 5   Mucus PRESENT   Wet prep, genital     Status: Abnormal   Collection Time: 03/13/21 10:16 AM   Specimen: Urine, Clean Catch  Result Value Ref Range   Yeast Wet Prep HPF POC NONE SEEN NONE SEEN   Trich, Wet Prep NONE SEEN NONE SEEN   Clue Cells Wet Prep HPF POC PRESENT (A) NONE SEEN   WBC, Wet Prep HPF POC >=10 (A) <10   Sperm NONE SEEN     Imaging No results found.  MAU Course  Procedures Lab Orders         Wet prep, genital         Urinalysis, Routine w reflex microscopic Urine, Clean Catch         Urinalysis, Microscopic (reflex)     Meds ordered this encounter  Medications   lactated ringers bolus 1,000 mL   ondansetron (ZOFRAN) 8 mg in sodium chloride 0.9 % 50 mL IVPB   cyclobenzaprine (FLEXERIL) 10 MG tablet    Sig: Take 1 tablet (10 mg total) by mouth 2 (two) times daily as needed for muscle spasms.    Dispense:  20 tablet    Refill:  0   metroNIDAZOLE (FLAGYL) 500 MG tablet    Sig: Take 1 tablet (500 mg total) by mouth 2 (two) times daily.    Dispense:  14 tablet    Refill:  0   Imaging Orders  No imaging studies ordered today    MDM moderate  Assessment and Plan  #Preterm contractions #[redacted] weeks gestation No  cervical change on serial exams. Patient appears more comfortable. Discussed further observation in MAU vs discharge home, she is comfortable with the latter.   #BV Prefers PO treatment  #FWB FHT Cat I NST: Reactive   Dispo: discharged to home in stable condition.    Clarnce Flock, MD/MPH 03/13/21 12:16 PM  Allergies as of 03/13/2021   No Known Allergies      Medication List     TAKE these medications    Accu-Chek Guide w/Device Kit 1 Device by Does not apply route as needed.   Accu-Chek Softclix Lancets lancets Use as instructed   aspirin EC 81 MG tablet Take 1 tablet (81 mg total) by mouth daily. Swallow whole.   cyclobenzaprine 10 MG tablet Commonly known as: FLEXERIL Take 1 tablet (10 mg total) by mouth 2 (two) times daily as needed for muscle spasms.   metFORMIN 500 MG tablet Commonly known as: GLUCOPHAGE Take 1 tablet (500 mg total) by mouth daily before supper.   metroNIDAZOLE 500 MG tablet Commonly known as: FLAGYL Take 1 tablet (500 mg total) by mouth 2 (two) times daily.   NIFEdipine 10 MG capsule Commonly known as: PROCARDIA Take 1 capsule (10 mg total) by mouth every 8 (eight) hours for 20 days.  Prenatal Plus/Iron 27-1 MG Tabs Take 1 tablet by mouth daily.

## 2021-03-14 LAB — GC/CHLAMYDIA PROBE AMP (~~LOC~~) NOT AT ARMC
Chlamydia: NEGATIVE
Comment: NEGATIVE
Comment: NORMAL
Neisseria Gonorrhea: NEGATIVE

## 2021-03-19 ENCOUNTER — Inpatient Hospital Stay (HOSPITAL_COMMUNITY)
Admission: AD | Admit: 2021-03-19 | Discharge: 2021-03-22 | DRG: 806 | Disposition: A | Discharge status: Home / Self Care | Payer: Medicaid Other | Attending: Obstetrics and Gynecology | Admitting: Obstetrics and Gynecology

## 2021-03-19 ENCOUNTER — Other Ambulatory Visit: Payer: Self-pay

## 2021-03-19 ENCOUNTER — Inpatient Hospital Stay (HOSPITAL_COMMUNITY): Payer: Medicaid Other | Admitting: Anesthesiology

## 2021-03-19 ENCOUNTER — Encounter (HOSPITAL_COMMUNITY): Payer: Self-pay | Admitting: Obstetrics and Gynecology

## 2021-03-19 DIAGNOSIS — O9081 Anemia of the puerperium: Secondary | ICD-10-CM | POA: Diagnosis not present

## 2021-03-19 DIAGNOSIS — O99019 Anemia complicating pregnancy, unspecified trimester: Secondary | ICD-10-CM | POA: Diagnosis present

## 2021-03-19 DIAGNOSIS — Z20822 Contact with and (suspected) exposure to covid-19: Secondary | ICD-10-CM | POA: Diagnosis not present

## 2021-03-19 DIAGNOSIS — Z30430 Encounter for insertion of intrauterine contraceptive device: Secondary | ICD-10-CM | POA: Diagnosis not present

## 2021-03-19 DIAGNOSIS — Z3A36 36 weeks gestation of pregnancy: Secondary | ICD-10-CM

## 2021-03-19 DIAGNOSIS — O134 Gestational [pregnancy-induced] hypertension without significant proteinuria, complicating childbirth: Secondary | ICD-10-CM | POA: Diagnosis not present

## 2021-03-19 DIAGNOSIS — O42913 Preterm premature rupture of membranes, unspecified as to length of time between rupture and onset of labor, third trimester: Secondary | ICD-10-CM | POA: Diagnosis not present

## 2021-03-19 DIAGNOSIS — D62 Acute posthemorrhagic anemia: Secondary | ICD-10-CM | POA: Diagnosis not present

## 2021-03-19 DIAGNOSIS — O24419 Gestational diabetes mellitus in pregnancy, unspecified control: Secondary | ICD-10-CM

## 2021-03-19 DIAGNOSIS — O09899 Supervision of other high risk pregnancies, unspecified trimester: Secondary | ICD-10-CM

## 2021-03-19 DIAGNOSIS — D563 Thalassemia minor: Secondary | ICD-10-CM | POA: Diagnosis not present

## 2021-03-19 DIAGNOSIS — O24429 Gestational diabetes mellitus in childbirth, unspecified control: Secondary | ICD-10-CM | POA: Diagnosis not present

## 2021-03-19 DIAGNOSIS — O42919 Preterm premature rupture of membranes, unspecified as to length of time between rupture and onset of labor, unspecified trimester: Secondary | ICD-10-CM | POA: Diagnosis present

## 2021-03-19 DIAGNOSIS — Z8759 Personal history of other complications of pregnancy, childbirth and the puerperium: Secondary | ICD-10-CM

## 2021-03-19 DIAGNOSIS — O24425 Gestational diabetes mellitus in childbirth, controlled by oral hypoglycemic drugs: Secondary | ICD-10-CM | POA: Diagnosis not present

## 2021-03-19 LAB — CBC
HCT: 32.3 % — ABNORMAL LOW (ref 36.0–46.0)
Hemoglobin: 10.1 g/dL — ABNORMAL LOW (ref 12.0–15.0)
MCH: 24.9 pg — ABNORMAL LOW (ref 26.0–34.0)
MCHC: 31.3 g/dL (ref 30.0–36.0)
MCV: 79.8 fL — ABNORMAL LOW (ref 80.0–100.0)
Platelets: 259 10*3/uL (ref 150–400)
RBC: 4.05 MIL/uL (ref 3.87–5.11)
RDW: 15.6 % — ABNORMAL HIGH (ref 11.5–15.5)
WBC: 7.9 10*3/uL (ref 4.0–10.5)
nRBC: 0 % (ref 0.0–0.2)

## 2021-03-19 LAB — TYPE AND SCREEN
ABO/RH(D): A POS
Antibody Screen: NEGATIVE

## 2021-03-19 LAB — RESP PANEL BY RT-PCR (FLU A&B, COVID) ARPGX2
Influenza A by PCR: NEGATIVE
Influenza B by PCR: NEGATIVE
SARS Coronavirus 2 by RT PCR: NEGATIVE

## 2021-03-19 LAB — GLUCOSE, CAPILLARY
Glucose-Capillary: 89 mg/dL (ref 70–99)
Glucose-Capillary: 109 mg/dL — ABNORMAL HIGH (ref 70–99)

## 2021-03-19 LAB — POCT FERN TEST: POCT Fern Test: POSITIVE

## 2021-03-19 MED ORDER — LACTATED RINGERS IV SOLN: INTRAVENOUS | Status: DC

## 2021-03-19 MED ORDER — LIDOCAINE HCL (PF) 1 % IJ SOLN: 30.0000 mL | INTRAMUSCULAR | Status: DC | PRN

## 2021-03-19 MED ORDER — ONDANSETRON HCL 4 MG/2ML IJ SOLN
4.0000 mg | Freq: Four times a day (QID) | INTRAMUSCULAR | Status: DC | PRN
  Administered 2021-03-19: 4 mg via INTRAVENOUS
  Filled 2021-03-19: qty 2

## 2021-03-19 MED ORDER — LACTATED RINGERS IV SOLN: 500.0000 mL | INTRAVENOUS | Status: DC | PRN

## 2021-03-19 MED ORDER — OXYCODONE-ACETAMINOPHEN 5-325 MG PO TABS: 1.0000 | ORAL_TABLET | ORAL | Status: DC | PRN

## 2021-03-19 MED ORDER — FENTANYL CITRATE (PF) 100 MCG/2ML IJ SOLN: 50.0000 ug | INTRAMUSCULAR | Status: DC | PRN

## 2021-03-19 MED ORDER — LACTATED RINGERS IV SOLN
500.0000 mL | Freq: Once | INTRAVENOUS | Status: AC
  Administered 2021-03-19: 500 mL via INTRAVENOUS

## 2021-03-19 MED ORDER — EPHEDRINE 5 MG/ML INJ: 10.0000 mg | INTRAVENOUS | Status: DC | PRN

## 2021-03-19 MED ORDER — ACETAMINOPHEN 325 MG PO TABS: 650.0000 mg | ORAL_TABLET | ORAL | Status: DC | PRN

## 2021-03-19 MED ORDER — DIPHENHYDRAMINE HCL 50 MG/ML IJ SOLN: 12.5000 mg | INTRAMUSCULAR | Status: DC | PRN

## 2021-03-19 MED ORDER — OXYTOCIN BOLUS FROM INFUSION
333.0000 mL | Freq: Once | INTRAVENOUS | Status: AC
  Administered 2021-03-20: 333 mL via INTRAVENOUS

## 2021-03-19 MED ORDER — PHENYLEPHRINE 40 MCG/ML (10ML) SYRINGE FOR IV PUSH (FOR BLOOD PRESSURE SUPPORT): 80.0000 ug | PREFILLED_SYRINGE | INTRAVENOUS | Status: DC | PRN

## 2021-03-19 MED ORDER — OXYTOCIN-SODIUM CHLORIDE 30-0.9 UT/500ML-% IV SOLN
1.0000 m[IU]/min | INTRAVENOUS | Status: DC
  Administered 2021-03-19: 2 m[IU]/min via INTRAVENOUS

## 2021-03-19 MED ORDER — FENTANYL-BUPIVACAINE-NACL 0.5-0.125-0.9 MG/250ML-% EP SOLN
EPIDURAL | Status: DC | PRN
  Administered 2021-03-19: 12 mL/h via EPIDURAL

## 2021-03-19 MED ORDER — TERBUTALINE SULFATE 1 MG/ML IJ SOLN: 0.2500 mg | Freq: Once | INTRAMUSCULAR | Status: DC | PRN

## 2021-03-19 MED ORDER — OXYCODONE-ACETAMINOPHEN 5-325 MG PO TABS: 2.0000 | ORAL_TABLET | ORAL | Status: DC | PRN

## 2021-03-19 MED ORDER — LIDOCAINE HCL (PF) 1 % IJ SOLN
INTRAMUSCULAR | Status: DC | PRN
  Administered 2021-03-19: 5 mL via EPIDURAL

## 2021-03-19 MED ORDER — SOD CITRATE-CITRIC ACID 500-334 MG/5ML PO SOLN: 30.0000 mL | ORAL | Status: DC | PRN

## 2021-03-19 MED ORDER — FENTANYL-BUPIVACAINE-NACL 0.5-0.125-0.9 MG/250ML-% EP SOLN
EPIDURAL | Status: AC
  Filled 2021-03-19: qty 250

## 2021-03-19 MED ORDER — FENTANYL-BUPIVACAINE-NACL 0.5-0.125-0.9 MG/250ML-% EP SOLN: 12.0000 mL/h | EPIDURAL | Status: DC | PRN

## 2021-03-19 MED ORDER — METFORMIN HCL 500 MG PO TABS
500.0000 mg | ORAL_TABLET | Freq: Every day | ORAL | Status: DC
  Filled 2021-03-19 (×2): qty 1

## 2021-03-19 MED ORDER — PHENYLEPHRINE 40 MCG/ML (10ML) SYRINGE FOR IV PUSH (FOR BLOOD PRESSURE SUPPORT)
80.0000 ug | PREFILLED_SYRINGE | INTRAVENOUS | Status: DC | PRN
  Administered 2021-03-19: 80 ug via INTRAVENOUS
  Filled 2021-03-19: qty 10

## 2021-03-19 MED ORDER — LEVONORGESTREL 20.1 MCG/DAY IU IUD
1.0000 | INTRAUTERINE_SYSTEM | Freq: Once | INTRAUTERINE | Status: AC
  Administered 2021-03-20: 1 via INTRAUTERINE
  Filled 2021-03-19: qty 1

## 2021-03-19 MED ORDER — OXYTOCIN-SODIUM CHLORIDE 30-0.9 UT/500ML-% IV SOLN
2.5000 [IU]/h | INTRAVENOUS | Status: DC
  Administered 2021-03-20: 2.5 [IU]/h via INTRAVENOUS
  Filled 2021-03-19: qty 500

## 2021-03-19 NOTE — Progress Notes (Signed)
S: Patient comfortable here her epidural. Has no concerns/questions right now. Anticipating the arrival of baby girl.   O: Vitals:   03/19/21 2050 03/19/21 2055 03/19/21 2100 03/19/21 2108  BP: (!) 120/51 119/62 114/60 (!) 105/53  Pulse: 75 90 93 97  Resp: 18 18 18 18   Temp:      TempSrc:      SpO2:      Weight:      Height:        FHT:  FHR: 125 bpm, variability: moderate,  accelerations:  Present,  decelerations:  Absent UC:   irregular ctxs SVE:   Dilation: 7 Effacement (%): 70 Station: -1 Exam by:: A. Damaria Vachon, SNM  A / P: 35 y.o. [phone] [redacted]w[redacted]d here for PPROM  -A2GDM- CBGs Q2 hours  -Expectant management  -GBS negative  Fetal Wellbeing:  Category I Pain Control:  Epidural Anticipated MOD:  NSVD -Plans to bottle feed  -Contraception: postplacental [redacted]w[redacted]d, SNM 03/19/2021, 9:37 PM

## 2021-03-19 NOTE — H&P (Signed)
Jessica Lutz is a 36 y.o. [id number] female at 23w3dby 12wk u/s presenting with PROM at 1620.  Denies s/s pre-e; no N/V. Reports active fetal movement, contractions: none, vaginal bleeding: none, membranes: ruptured, clear fluid.  [facility]   Most recent u/s : 317w3dEFW 40th%, AFI 14cm, BPP 8/10.   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   Prenatal History/Complications:  # hx pre-e with G4  Past Medical History: Past Medical History:  Diagnosis Date   Anemia 03/22/2018   Obesity affecting pregnancy 05/29/2019   Recommendations _0  Aspirin 81 mg daily after 12 weeks; discontinue after 36 weeks _1  Nutrition consult _2  Weight gain 11-20 lbs for singleton and 25-35 lbs for twin pregnancy (IOM guidelines) Higher class of obesity patients recommended to gain closer to lower limit  Weight loss is associated with adverse outcomes _3  Screen for DM with A1C or early 2 hr GTT _4  Baseline and surveillance labs (   Positive GBS test 11/09/2019   Pre-eclampsia, mild 11/25/2019   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   Pseudodeficiency variant detected for Tay-Sachs Not a variant of concern Genetic Counseling: on 07/20/2019 _5 partner testing   Supervision of low-risk pregnancy 05/29/2019    Nursing Staff Provider Office Location  [facility] Dating  LMP Language   English Anatomy US[place]Normal - incomplete - FU 7/7 - normal Flu Vaccine  Declined-06/01/19 Genetic Screen  NIPS: LR  AFP:  Not performed   TDaP vaccine   09-13-19  Hgb A1C or  GTT Early 5.7 Third trimester WNL  Glucose, Fasting 65 - 91 mg/dL 88  Glucose, 1 hour 65 - 179 mg/dL 166  Glucose, 2 hour 65 - 152 mg/dL 124   Rhogam  NA       Past Surgical History: Past Surgical History:  Procedure Laterality Date   NO PAST SURGERIES      Obstetrical History: OB History     Gravida  5   Para  2   Term  2    Preterm  0   AB  2   Living  2      SAB  2   IAB  0   Ectopic  0   Multiple  0   Live Births  2           Social History: Social History   Socioeconomic History   Marital status: Single    Spouse name: Not on file   Number of children: Not on file   Years of education: Not on file   Highest education level: Not on file  Occupational History   Not on file  Tobacco Use   Smoking status: Never   Smokeless tobacco: Never  Vaping Use   Vaping Use: Never used  Substance and Sexual Activity   Alcohol use: Not Currently    Comment: occ   Drug use: Not Currently    Types: Marijuana    Comment: last used 3 wks ago from 07/18/19   Sexual activity: Yes    Birth control/protection: None  Other Topics Concern   Not on file  Social History Narrative   Not on file   Social Determinants of Health   Financial Resource Strain: Not on file  Food Insecurity: No Food Insecurity   Worried About Running Out of Food in the Last Year: Never true   Ran  Out of Food in the Last Year: Never true  Transportation Needs: No Transportation Needs   Lack of Transportation (Medical): No   Lack of Transportation (Non-Medical): No  Physical Activity: Not on file  Stress: Not on file  Social Connections: Not on file    Family History: Family History  Problem Relation Age of Onset   Thyroid disease Mother    Alcohol abuse Neg Hx     Allergies: No Known Allergies  Medications Prior to Admission  Medication Sig Dispense Refill Last Dose   Prenatal Vit-Fe Fumarate-FA (PRENATAL PLUS/IRON) 27-1 MG TABS Take 1 tablet by mouth daily. 30 tablet 11 Past Month   Accu-Chek Softclix Lancets lancets Use as instructed 100 each 12    aspirin EC 81 MG tablet Take 1 tablet (81 mg total) by mouth daily. Swallow whole. (Patient not taking: Reported on 03/13/2021) 30 tablet 11    Blood Glucose Monitoring Suppl (ACCU-CHEK GUIDE) w/Device KIT 1 Device by Does not apply route as needed. 1 kit 0     cyclobenzaprine (FLEXERIL) 10 MG tablet Take 1 tablet (10 mg total) by mouth 2 (two) times daily as needed for muscle spasms. (Patient not taking: Reported on 03/13/2021) 20 tablet 0    metFORMIN (GLUCOPHAGE) 500 MG tablet Take 1 tablet (500 mg total) by mouth daily before supper. (Patient not taking: Reported on 02/27/2021) 60 tablet 5    NIFEdipine (PROCARDIA) 10 MG capsule Take 1 capsule (10 mg total) by mouth every 8 (eight) hours for 20 days. (Patient not taking: Reported on 03/13/2021) 60 capsule 0     Review of Systems  Pertinent pos/neg as indicated in HPI  Blood pressure 138/84, pulse (!) 112, temperature 98 F (36.7 C), temperature source Oral, resp. rate 20, height _0  (1.676 m), weight 108.9 kg, last menstrual period 06/30/2020, SpO2 100 %, not currently breastfeeding. General appearance: alert, cooperative, and no distress Lungs: clear to auscultation bilaterally Heart: regular rate and rhythm Abdomen: gravid, soft, non-tender, EFW by Leopold's approximately 5lbs Extremities: tr edema DTR's nl  Fetal monitoring: FHR: 135-140 bpm, variability: moderate,  Accelerations: Present,  decelerations:  Absent Uterine activity: rare, mild   Presentation:  cephalic by BSUS in MAU   Prenatal labs: ABO, Rh: --/--/PENDING (02/08 1825) Antibody: PENDING (02/08 1825) Rubella: 2.19 (09/14 0953) RPR: NON REACTIVE (01/22 1007)  HBsAg: Negative (09/14 0953)  HIV: NON REACTIVE (01/22 1009)  GBS: NEGATIVE/-- (01/22 1017)  2hr GTT: 97/133/128  Prenatal Transfer Tool  Maternal Diabetes: Yes:  Diabetes Type:  Insulin/Medication controlled Genetic Screening: Normal (maternal +alpha thal silent carrier) Maternal Ultrasounds/Referrals: Normal Fetal Ultrasounds or other Referrals:  None Maternal Substance Abuse:  No Significant Maternal Medications:  None Significant Maternal Lab Results: Group B Strep negative  Results for orders placed or performed during the hospital encounter of 03/19/21  (from the past 24 hour(s))  POCT fern test   Collection Time: 03/19/21  6:14 PM  Result Value Ref Range   POCT Fern Test Positive = ruptured amniotic membanes   Type and screen [place]   Collection Time: 03/19/21  6:25 PM  Result Value Ref Range   ABO/RH(D) PENDING    Antibody Screen PENDING    Sample Expiration      03/22/2021,2359 Performed at [facility], [place] [address], [place], [place] [address]      Assessment:  50w3dSIUP  [id number] PPROM  GDMA2  Cat 1 FHR  GBS NEGATIVE/-- (01/22 1017)  Plan:  Admit to L&D  IV pain meds/epidural prn active labor  Expectant management  Anticipate vag del   Plans to bottlefeed  Contraception: postplacental Elizabeth Palau [name] 03/19/2021, 7:00 PM

## 2021-03-19 NOTE — Anesthesia Preprocedure Evaluation (Signed)
Anesthesia Evaluation  Patient identified by MRN, date of birth, ID band Patient awake    Reviewed: Allergy & Precautions, NPO status , Patient's Chart, lab work & pertinent test results  Airway Mallampati: II  TM Distance: >3 FB Neck ROM: Full    Dental no notable dental hx. (+) Teeth Intact, Dental Advisory Given   Pulmonary    Pulmonary exam normal breath sounds clear to auscultation       Cardiovascular hypertension (gHTN), Pt. on medications Normal cardiovascular exam Rhythm:Regular Rate:Normal     Neuro/Psych    GI/Hepatic   Endo/Other  diabetes, Gestational  Renal/GU      Musculoskeletal   Abdominal (+) + obese (BMI 38.8),   Peds  Hematology Lab Results      Component                Value               Date                            HGB                      10.1 (L)            03/19/2021                HCT                      32.3 (L)            03/19/2021                PLT                      259                 03/19/2021              Anesthesia Other Findings   Reproductive/Obstetrics                            Anesthesia Physical Anesthesia Plan  ASA: 3  Anesthesia Plan: Epidural   Post-op Pain Management:    Induction:   PONV Risk Score and Plan:   Airway Management Planned:   Additional Equipment:   Intra-op Plan:   Post-operative Plan:   Informed Consent: I have reviewed the patients History and Physical, chart, labs and discussed the procedure including the risks, benefits and alternatives for the proposed anesthesia with the patient or authorized representative who has indicated his/her understanding and acceptance.       Plan Discussed with:   Anesthesia Plan Comments: (36.3 Wk G5P2 W GHTN, GDM aNEMIA FOR lea)        Anesthesia Quick Evaluation

## 2021-03-19 NOTE — MAU Note (Signed)
.  Jessica Lutz is a 36 y.o. at [redacted]w[redacted]d here in MAU reporting: SROM at 1620, clear fluid. Denies VB. Endorses good fetal movement. Denies ctx currently.   Pain score: 0 Vitals:   03/19/21 1803  BP: 138/84  Pulse: (!) 112  Resp: 20  Temp: 98 F (36.7 C)  SpO2: 100%     FHT:142

## 2021-03-19 NOTE — Anesthesia Procedure Notes (Signed)

## 2021-03-19 NOTE — Progress Notes (Signed)
S: Patient comfortable resting in bed with no questions or concerns right now.   O: Vitals:   03/19/21 2100 03/19/21 2108 03/19/21 2130 03/19/21 2200  BP: 114/60 (!) 105/53 103/62 114/80  Pulse: 93 97 77 (!) 103  Resp: 18 18 18 16   Temp:      TempSrc:      SpO2:      Weight:      Height:        FHT:  FHR: 125 bpm, variability: moderate,  accelerations:  Present,  decelerations:  Absent UC:   irregular ctxs SVE:   Dilation: 7 Effacement (%): 70 Station: -1 Exam by:: A. Molly Savarino, SNM  A / P: 35 y.o. [phone] [redacted]w[redacted]d here for PPROM  -Cervix unchanged . Discussed Pitocin for labor augmentation and patient agrees with the plan to achieve more regular ctxs.  -GBS negative  Fetal Wellbeing:  Category I Pain Control:  Epidural Anticipated MOD:  NSVD  [redacted]w[redacted]d, SNM 03/19/2021, 11:05 PM

## 2021-03-19 NOTE — MAU Provider Note (Signed)
Ms. Jessica Lutz is a 36 y.o. [phone] female at [redacted]w[redacted]d weeks gestation presenting to MAU with complaints of PPROM since 55. MAU provider was requested by RN to verify presentation.  NST - FHR: 130 bpm / moderate variability / accels present / decels absent / TOCO: none  Patient informed that the ultrasound is considered a limited OB ultrasound and is not intended to be a complete ultrasound exam.  Patient also informed that the ultrasound is not being completed with the intent of assessing for fetal or placental anomalies or any pelvic abnormalities.  Explained that the purpose of todays ultrasound is to assess for presentation.  Baby was found to be in a cephalic (direct OP) presentation. Patient acknowledges the purpose of the exam and the limitations of the study.  Laury Deep, [name]  03/19/2021 6:15 PM

## 2021-03-20 ENCOUNTER — Encounter (HOSPITAL_COMMUNITY): Payer: Self-pay | Admitting: Obstetrics and Gynecology

## 2021-03-20 ENCOUNTER — Encounter: Payer: Medicaid Other | Admitting: Obstetrics and Gynecology

## 2021-03-20 DIAGNOSIS — Z30430 Encounter for insertion of intrauterine contraceptive device: Secondary | ICD-10-CM

## 2021-03-20 DIAGNOSIS — O24425 Gestational diabetes mellitus in childbirth, controlled by oral hypoglycemic drugs: Secondary | ICD-10-CM

## 2021-03-20 DIAGNOSIS — Z3A36 36 weeks gestation of pregnancy: Secondary | ICD-10-CM

## 2021-03-20 DIAGNOSIS — O42913 Preterm premature rupture of membranes, unspecified as to length of time between rupture and onset of labor, third trimester: Secondary | ICD-10-CM

## 2021-03-20 LAB — RPR: RPR Ser Ql: NONREACTIVE

## 2021-03-20 MED ORDER — SENNOSIDES-DOCUSATE SODIUM 8.6-50 MG PO TABS
2.0000 | ORAL_TABLET | ORAL | Status: DC
  Filled 2021-03-20 (×2): qty 2

## 2021-03-20 MED ORDER — MEASLES, MUMPS & RUBELLA VAC IJ SOLR: 0.5000 mL | Freq: Once | INTRAMUSCULAR | Status: DC

## 2021-03-20 MED ORDER — DIBUCAINE (PERIANAL) 1 % EX OINT: 1.0000 "application " | TOPICAL_OINTMENT | CUTANEOUS | Status: DC | PRN

## 2021-03-20 MED ORDER — WITCH HAZEL-GLYCERIN EX PADS: 1.0000 "application " | MEDICATED_PAD | CUTANEOUS | Status: DC | PRN

## 2021-03-20 MED ORDER — COCONUT OIL OIL: 1.0000 "application " | TOPICAL_OIL | Status: DC | PRN

## 2021-03-20 MED ORDER — SIMETHICONE 80 MG PO CHEW
80.0000 mg | CHEWABLE_TABLET | ORAL | Status: DC | PRN
  Filled 2021-03-20: qty 1

## 2021-03-20 MED ORDER — OXYCODONE HCL 5 MG PO TABS
5.0000 mg | ORAL_TABLET | ORAL | Status: DC | PRN
  Administered 2021-03-22: 5 mg via ORAL
  Filled 2021-03-20: qty 1

## 2021-03-20 MED ORDER — ZOLPIDEM TARTRATE 5 MG PO TABS: 5.0000 mg | ORAL_TABLET | Freq: Every evening | ORAL | Status: DC | PRN

## 2021-03-20 MED ORDER — ACETAMINOPHEN 325 MG PO TABS
650.0000 mg | ORAL_TABLET | ORAL | Status: DC | PRN
  Administered 2021-03-22: 650 mg via ORAL
  Filled 2021-03-20: qty 2

## 2021-03-20 MED ORDER — DIPHENHYDRAMINE HCL 25 MG PO CAPS: 25.0000 mg | ORAL_CAPSULE | Freq: Four times a day (QID) | ORAL | Status: DC | PRN

## 2021-03-20 MED ORDER — TETANUS-DIPHTH-ACELL PERTUSSIS 5-2.5-18.5 LF-MCG/0.5 IM SUSY: 0.5000 mL | PREFILLED_SYRINGE | Freq: Once | INTRAMUSCULAR | Status: DC

## 2021-03-20 MED ORDER — BENZOCAINE-MENTHOL 20-0.5 % EX AERO: 1.0000 "application " | INHALATION_SPRAY | CUTANEOUS | Status: DC | PRN

## 2021-03-20 MED ORDER — ONDANSETRON HCL 4 MG PO TABS: 4.0000 mg | ORAL_TABLET | ORAL | Status: DC | PRN

## 2021-03-20 MED ORDER — IBUPROFEN 600 MG PO TABS
600.0000 mg | ORAL_TABLET | Freq: Four times a day (QID) | ORAL | Status: DC
  Administered 2021-03-20 – 2021-03-22 (×7): 600 mg via ORAL
  Filled 2021-03-20 (×8): qty 1

## 2021-03-20 MED ORDER — PRENATAL MULTIVITAMIN CH
1.0000 | ORAL_TABLET | Freq: Every day | ORAL | Status: DC
  Administered 2021-03-20 – 2021-03-21 (×2): 1 via ORAL
  Filled 2021-03-20 (×2): qty 1

## 2021-03-20 MED ORDER — ONDANSETRON HCL 4 MG/2ML IJ SOLN: 4.0000 mg | INTRAMUSCULAR | Status: DC | PRN

## 2021-03-20 NOTE — Discharge Summary (Addendum)
Postpartum Discharge Summary  Date of Service updated     Patient Name: Jessica Lutz DOB: 12-04-1985 MRN: [id number]  Date of admission: 03/19/2021 Delivery date:03/20/2021  Delivering provider: Serita Grammes D  Date of discharge: 03/22/2021  Admitting diagnosis: Preterm premature rupture of membranes (PPROM) with unknown onset of labor [O42.919] Intrauterine pregnancy: [redacted]w[redacted]d     Secondary diagnosis:  Principal Problem:   Preterm premature rupture of membranes (PPROM) with unknown onset of labor Active Problems:   Alpha thalassemia silent carrier   History of pre-eclampsia   Short interval between pregnancies affecting pregnancy, antepartum   Gestational diabetes mellitus, class A2   Preterm labor   SVD (spontaneous vaginal delivery)   Anemia affecting pregnancy  Additional problems: none    Discharge diagnosis: Preterm Pregnancy Delivered and GDM A2                                              Post partum procedures: postplacental Liletta placement Augmentation: Pitocin Complications: None  Hospital course: Onset of Labor With Vaginal Delivery      36 y.o. yo [id number] at [redacted]w[redacted]d was admitted in Latent Labor with PPROM on 03/19/2021. Patient had an uncomplicated labor course, receiving an epidural as well as some Pitocin once her ctx spaced out. She then had an IUD placed postplacentally.  Membrane Rupture Time/Date: 4:20 PM ,03/19/2021   Delivery Method:Vaginal, Spontaneous  Episiotomy: None  Lacerations:  None  Patient had an uncomplicated postpartum course.  She is ambulating, tolerating a regular diet, passing flatus, and urinating well. Patient is discharged home in stable condition on 03/22/21.  Newborn Data: Birth date:03/20/2021  Birth time:12:15 AM  Gender:Female  Living status:Living  Apgars:9 ,9  Weight:2920 g (6lb 7oz)  Magnesium Sulfate received: No BMZ received: No Rhophylac:N/A MMR:N/A T-DaP:Given prenatally Flu: No Transfusion:No  Physical exam   Vitals:   03/21/21 1438 03/21/21 2052 03/21/21 2236 03/22/21 0548  BP: (!) 136/92 (!) 146/84 140/88 125/88  Pulse: 90 96  95  Resp: $Remo'16 18  18  'jgtkj$ Temp: 98.5 F (36.9 C) 98.5 F (36.9 C)  98.1 F (36.7 C)  TempSrc: Oral Oral  Oral  SpO2: 100% 100%  99%  Weight:      Height:       General: alert, cooperative, and no distress Lochia: appropriate Uterine Fundus: firm Incision: N/A DVT Evaluation: No evidence of DVT seen on physical exam. Labs: Lab Results  Component Value Date   WBC 7.9 03/19/2021   HGB 10.1 (L) 03/19/2021   HCT 32.3 (L) 03/19/2021   MCV 79.8 (L) 03/19/2021   PLT 259 03/19/2021   CMP Latest Ref Rng & Units 03/02/2021  Glucose 70 - 99 mg/dL 94  BUN 6 - 20 mg/dL 5(L)  Creatinine 0.44 - 1.00 mg/dL 0.60  Sodium 135 - 145 mmol/L 136  Potassium 3.5 - 5.1 mmol/L 3.9  Chloride 98 - 111 mmol/L 105  CO2 22 - 32 mmol/L 18(L)  Calcium 8.9 - 10.3 mg/dL 9.6  Total Protein 6.5 - 8.1 g/dL 6.2(L)  Total Bilirubin 0.3 - 1.2 mg/dL 0.8  Alkaline Phos 38 - 126 U/L 172(H)  AST 15 - 41 U/L 20  ALT 0 - 44 U/L 10   Edinburgh Score: Edinburgh Postnatal Depression Scale Screening Tool 03/20/2021  I have been able to laugh and see the funny side of things. 0  I have looked forward with enjoyment to things. 0  I have blamed myself unnecessarily when things went wrong. 0  I have been anxious or worried for no good reason. 0  I have felt scared or panicky for no good reason. 0  Things have been getting on top of me. 0  I have been so unhappy that I have had difficulty sleeping. 0  I have felt sad or miserable. 0  I have been so unhappy that I have been crying. 0  The thought of harming myself has occurred to me. 0  Edinburgh Postnatal Depression Scale Total 0     After visit meds:  Allergies as of 03/22/2021   No Known Allergies      Medication List     STOP taking these medications    Accu-Chek Guide w/Device Kit   Accu-Chek Softclix Lancets lancets   aspirin EC 81  MG tablet   cyclobenzaprine 10 MG tablet Commonly known as: FLEXERIL   metFORMIN 500 MG tablet Commonly known as: GLUCOPHAGE   NIFEdipine 10 MG capsule Commonly known as: PROCARDIA       TAKE these medications    acetaminophen 500 MG tablet Commonly known as: TYLENOL Take 2 tablets (1,000 mg total) by mouth every 8 (eight) hours as needed (pain).   ibuprofen 600 MG tablet Commonly known as: ADVIL Take 1 tablet (600 mg total) by mouth every 6 (six) hours as needed (pain).   Prenatal Plus/Iron 27-1 MG Tabs Take 1 tablet by mouth daily.         Discharge home in stable condition Infant Feeding: Bottle Infant Disposition:home with mother Discharge instruction: per After Visit Summary and Postpartum booklet. Activity: Advance as tolerated. Pelvic rest for 6 weeks.  Diet: routine diet Future Appointments: Future Appointments  Date Time Provider [place]  05/01/2021  8:20 AM [facility]  05/01/2021  9:15 AM Starr Lake, [name] [facility]   Follow up Visit:  Myrtis Ser, [name]  P [facility] Admin Pool Please schedule this patient for Postpartum visit in: 6 weeks with the following provider: Any provider  In-Person  For C/S patients schedule nurse incision check in weeks 2 weeks: no  High risk pregnancy complicated by: GDMA2, preterm delivery  Delivery mode:  SVD  Anticipated Birth Control:  PP IUD Placed  PP Procedures needed: string trim from IUD; 2hr GTT  Schedule Integrated [place] visit: no   Upon discharge, patient did not want to be started on any BLood pressure medicine or lasix. Patient and mother are adamant that she is asymptomatic and that she only had "two high numbers". Patietn's mother states that the procardia and lasix caused her cramping and that she "will not be able to take care of baby at home on these medicines you want to give her". Patient did not want to talk further about medicines as she wanted to continue getting  dressed. Patient agreed to come to MAU if any warning signs of pre-e, and to BP check on Tuesday. (Patient stated she could not come for BP check on Monday due to baby's pediatrician appt.) -message sent to clinic to schedule patient for BP check on Tuesday.    03/22/2021 Starr Lake, [name]

## 2021-03-20 NOTE — Procedures (Signed)
POST-PLACENTAL IUD INSERTION PROCEDURE NOTE Patient name: Jessica Lutz MRN [id number]  Date of birth: 03-Jul-1985  The risks and benefits of the method and placement have been thouroughly reviewed with the patient and all questions were answered.  Specifically the patient is aware of failure rate of 02/998, expulsion of the IUD and of possible perforation.  The patient is aware of irregular bleeding due to the method and understands the incidence of irregular bleeding diminishes with time.   Signed copy of informed consent in chart.   Vaginal, labial and perineal areas thoroughly inspected for lacerations. Lacerations: none laceration identified.  Pt's IUD choice: Liletta  Time out was performed.    IUD removed from insertion device and grasped between sterile gloved fingers. Fundus identified through abdominal wall using non-insertion hand. IUD inserted to fundus with bimanual technique. IUD carefully released at the fundus and insertion hand gently removed from vagina.    Strings trimmed to the level of the introitus. Patient tolerated procedure well.  Patient given post procedure instructions and IUD care card with expiration date.  Patient is asked to keep IUD strings tucked in her vagina until her postpartum follow up visit in 4-6 weeks. Patient advised to abstain from sexual intercourse and pulling on strings before her follow-up visit. Patient verbalized an understanding of the plan of care and agrees.   Pt added to the post-placental IUD insertion list and charges entered.  LOT: [id number] EXP: 12/2023  [name] [name] 03/20/2021 12:55 AM

## 2021-03-20 NOTE — Anesthesia Postprocedure Evaluation (Signed)
Anesthesia Post Note  Patient: Jessica Lutz  Procedure(s) Performed: AN AD HOC LABOR EPIDURAL     Patient location during evaluation: Mother Baby Anesthesia Type: Epidural Level of consciousness: awake, oriented and awake and alert Pain management: pain level controlled Vital Signs Assessment: post-procedure vital signs reviewed and stable Respiratory status: spontaneous breathing, respiratory function stable and nonlabored ventilation Cardiovascular status: stable Postop Assessment: no headache, adequate PO intake, able to ambulate, patient able to bend at knees and no apparent nausea or vomiting Anesthetic complications: no   No notable events documented.  Last Vitals:  Vitals:   03/20/21 0220 03/20/21 0330  BP: 122/84 134/81  Pulse: 86 83  Resp: 16 16  Temp: (!) 36.4 C 36.5 C  SpO2: 100% 99%    Last Pain:  Vitals:   03/20/21 0330  TempSrc: Oral  PainSc: 0-No pain   Pain Goal: Patients Stated Pain Goal: 0 (03/20/21 0000)                 Brynnlee Cumpian

## 2021-03-20 NOTE — Lactation Note (Signed)
This note was copied from a baby's chart. Lactation Consultation Note  Patient Name: Jessica Lutz Today's Date: 03/20/2021 Reason for consult: Initial assessment;Late-preterm 34-36.6wks Age:35 hours   LC Note:  Per RN, mother is formula feeding only.   Maternal Data    Feeding Mother's Current Feeding Choice: Formula Nipple Type: Slow - flow  LATCH Score                    Lactation Tools Discussed/Used    Interventions    Discharge    Consult Status Consult Status: Complete    [place] R Nathanial Arrighi 03/20/2021, 8:01 AM

## 2021-03-21 ENCOUNTER — Ambulatory Visit: Payer: Medicaid Other

## 2021-03-21 DIAGNOSIS — O99019 Anemia complicating pregnancy, unspecified trimester: Secondary | ICD-10-CM | POA: Diagnosis present

## 2021-03-21 MED ORDER — NIFEDIPINE ER OSMOTIC RELEASE 30 MG PO TB24
30.0000 mg | ORAL_TABLET | Freq: Every day | ORAL | Status: DC
  Administered 2021-03-21: 30 mg via ORAL
  Filled 2021-03-21: qty 1

## 2021-03-21 MED ORDER — FUROSEMIDE 20 MG PO TABS: 20.0000 mg | ORAL_TABLET | Freq: Every day | ORAL | Status: DC

## 2021-03-21 NOTE — Progress Notes (Addendum)
Spoke with pt's nurse about multiple high bps in 140s/80's. Per nurse, pt is asymptomatic, no blurry vision or headaches. Will start procardia 30 mg daily and lasix 20 mg daily for 5 days.

## 2021-03-21 NOTE — Progress Notes (Signed)
Post Partum Day 1  Subjective:  Jessica Lutz is a 35 y.o. [id number] [redacted]w[redacted]d s/p vaginal delivery.  No acute events overnight.  Pt denies problems with ambulating, voiding or po intake.  She denies nausea or vomiting.  Pain is well controlled.  She has had flatus.  Lochia Minimal.  Plan for birth control is IUD.  Method of Feeding: Formula  Objective: Blood pressure 125/86, pulse 80, temperature 98.2 F (36.8 C), temperature source Oral, resp. rate 18, height 5\' 6"  (1.676 m), weight 108.9 kg, last menstrual period 06/30/2020, SpO2 98 %, unknown if currently breastfeeding.  Physical Exam:  General: alert, cooperative and no distress Lochia:normal flow Chest: normal WOB Heart: Regular rate Abdomen: +BS, soft, mild TTP (appropriate) Uterine Fundus: firm, below umbilicus DVT Evaluation: No evidence of DVT seen on physical exam. Extremities: No edema  Recent Labs    03/19/21 1830  HGB 10.1*  HCT 32.3*    Assessment/Plan:  ASSESSMENT: Jessica Lutz is a 36 y.o. 20 [redacted]w[redacted]d s/p VD  Plan for discharge tomorrow Continue routine PP care Breastfeeding support PRN  #A2GDM: was on metformin. Fasting BG was not performed today and the patient has eaten. Plan for BG tomorrow and needs 2 hr GTT at 6 wk  #Anemia: present at admission. Mild in nature. Plan on continuing prenatal vitamin  #Preterm: infant is doing well.    LOS: 2 days   [redacted]w[redacted]d 03/21/2021, 11:42 AM

## 2021-03-22 LAB — GLUCOSE, CAPILLARY: Glucose-Capillary: 79 mg/dL (ref 70–99)

## 2021-03-22 MED ORDER — IBUPROFEN 600 MG PO TABS: 600.0000 mg | ORAL_TABLET | Freq: Four times a day (QID) | ORAL | 0 refills | Status: DC | PRN

## 2021-03-22 MED ORDER — ACETAMINOPHEN 500 MG PO TABS: 1000.0000 mg | ORAL_TABLET | Freq: Three times a day (TID) | ORAL | 0 refills | Status: DC | PRN

## 2021-03-24 ENCOUNTER — Telehealth: Payer: Self-pay

## 2021-03-24 ENCOUNTER — Telehealth: Payer: Self-pay | Admitting: *Deleted

## 2021-03-24 DIAGNOSIS — Z9189 Other specified personal risk factors, not elsewhere classified: Secondary | ICD-10-CM

## 2021-03-24 NOTE — Telephone Encounter (Signed)
Transition Care Management Follow-up Telephone Call Date of discharge and from where: 03/22/2021-[facility]  How have you been since you were released from the hospital? Pt stated she is doing ok but has some abdominal pain that she will address with OB at her appointment tomorrow.  Any questions or concerns? No  Items Reviewed: Did the pt receive and understand the discharge instructions provided? Yes  Medications obtained and verified? Yes  Other? No  Any new allergies since your discharge? No  Dietary orders reviewed? No Do you have support at home? Yes   Home Care and Equipment/Supplies: Were home health services ordered? not applicable If so, what is the name of the agency? N/A  Has the agency set up a time to come to the patient's home? not applicable Were any new equipment or medical supplies ordered?  No What is the name of the medical supply agency? N/A Were you able to get the supplies/equipment? not applicable Do you have any questions related to the use of the equipment or supplies? No  Functional Questionnaire: (I = Independent and D = Dependent) ADLs: I  Bathing/Dressing- I  Meal Prep- I  Eating- I  Maintaining continence- I  Transferring/Ambulation- I  Managing Meds- I  Follow up appointments reviewed:  PCP Hospital f/u appt confirmed? No   Specialist Hospital f/u appt confirmed? Yes  Scheduled to see OBGYN on 03/25/21 @ 9:40AM. Are transportation arrangements needed? No  If their condition worsens, is the pt aware to call PCP or go to the Emergency Dept.? Yes Was the patient provided with contact information for the PCP's office or ED? Yes Was to pt encouraged to call back with questions or concerns? Yes

## 2021-03-24 NOTE — Telephone Encounter (Signed)
° °  Telephone encounter was:  Unsuccessful.  03/24/2021 Name: Jessica Lutz MRN: [id number] DOB: 18-Jan-1986  Unsuccessful outbound call made today to assist with:  pcp  Outreach Attempt:  1st Mailbox is full   Alois Cliche -[facility] Guide , Embedded Care Coordination [facility], Care Management  [phone] [address] [place] , [place] [address] Email : [email or web address]

## 2021-03-25 ENCOUNTER — Other Ambulatory Visit: Payer: Self-pay

## 2021-03-25 ENCOUNTER — Ambulatory Visit (INDEPENDENT_AMBULATORY_CARE_PROVIDER_SITE_OTHER): Payer: Medicaid Other

## 2021-03-25 VITALS — BP 114/79 | HR 92 | Wt 222.0 lb

## 2021-03-25 DIAGNOSIS — Z0130 Encounter for examination of blood pressure without abnormal findings: Secondary | ICD-10-CM

## 2021-03-25 NOTE — Progress Notes (Signed)
Blood Pressure Check Visit  Jessica Lutz is here for blood pressure check following spontaneous vaginal delivery on 03/20/21. BP today is 114/79. Patient denies any dizziness, blurred vision, headache, shortness of breath, peripheral edema. I reviewed with patient signs and symptoms of pre-eclampsia. Patient denies any other concerns or questions. I reviewed patient's postpartum appointment date and time with her.   Cline Crock, RN 03/25/2021

## 2021-03-26 ENCOUNTER — Encounter: Payer: Medicaid Other | Admitting: Family Medicine

## 2021-03-27 ENCOUNTER — Telehealth: Payer: Self-pay | Admitting: *Deleted

## 2021-03-27 NOTE — Telephone Encounter (Signed)
° °  Telephone encounter was:  Unsuccessful.  03/27/2021 Name: JAKEVIA DYAL MRN: [id number] DOB: 09/14/1985  Unsuccessful outbound call made today to assist with:   PCP  Outreach Attempt:  2nd Attempt  A HIPAA compliant voice message was left requesting a return call.  Instructed patient to call back at   Instructed patient to call back at [phone]  at their earliest convenience. . [place], Care Management  [phone] [address] , [place] [address] Email : [email or web address]

## 2021-03-31 ENCOUNTER — Telehealth: Payer: Self-pay | Admitting: *Deleted

## 2021-03-31 NOTE — Telephone Encounter (Signed)
° °  Telephone encoUnsuccessful.  03/31/2021 Name: Jessica Lutz MRN: [id number] DOB: 03/19/1985  Unsuccessful outbound call made today to assist with:   PCP  Outreach Attempt:  2nd Attempt  A HIPAA compliant voice message was left requesting a return call.  Instructed patient to call back at   Instructed patient to call back at [phone]  at their earliest convenience. Yehuda Mao Greenauer -[facility] Guide , Embedded Care Coordination [facility], Care Management  [phone] [address] [place] , [place] [address] Email : [email or web address]

## 2021-04-02 ENCOUNTER — Encounter: Payer: Medicaid Other | Admitting: Obstetrics & Gynecology

## 2021-04-02 ENCOUNTER — Telehealth: Payer: Self-pay | Admitting: *Deleted

## 2021-04-02 NOTE — Telephone Encounter (Signed)
° °  Telephone encounter was:  Successful.  04/02/2021 Name: TZIPPORAH NAGORSKI MRN: [id number] DOB: [birth year]  [name] is a 36 y.o. year old female who is a primary care patient of Pcp, No . The community resource team was consulted for assistance with  pcp  Care guide performed the following interventions: patient asked that I email her a list of local PCP accepting her healthy Blue medicaid .  Follow Up Plan:  No further follow up planned at this time. The patient has been provided with needed resources. Patient will acknowledge receipt of email   Alois Cliche -[facility] Guide , Embedded Care Coordination [facility], Care Management  [phone] [address] [place] , [place] [address] Email : [email or web address]

## 2021-05-01 ENCOUNTER — Other Ambulatory Visit: Payer: Self-pay

## 2021-05-01 ENCOUNTER — Ambulatory Visit (INDEPENDENT_AMBULATORY_CARE_PROVIDER_SITE_OTHER): Payer: Medicaid Other | Admitting: Student

## 2021-05-01 ENCOUNTER — Encounter: Payer: Self-pay | Admitting: Student

## 2021-05-01 ENCOUNTER — Other Ambulatory Visit: Payer: Medicaid Other

## 2021-05-01 ENCOUNTER — Other Ambulatory Visit: Payer: Self-pay | Admitting: General Practice

## 2021-05-01 DIAGNOSIS — Z8632 Personal history of gestational diabetes: Secondary | ICD-10-CM

## 2021-05-01 MED ORDER — NORETHIN ACE-ETH ESTRAD-FE 1-20 MG-MCG(24) PO TABS: 1.0000 | ORAL_TABLET | Freq: Every day | ORAL | 11 refills | Status: DC

## 2021-05-01 NOTE — Progress Notes (Signed)
Pt states IUD fell out x 2 weeks ago. Pt has not had intercourse since. Pt would like to discuss Nexplanon.  ?[name],RN  ? ? ? ? ?Post Partum Visit Note ? ?Jessica Lutz is a 36 y.o. [phone] female who presents for a postpartum visit. She is 6 week postpartum following a normal spontaneous vaginal delivery.  I have fully reviewed the prenatal and intrapartum course. The delivery was at 36 gestational weeks.  Anesthesia: epidural. Postpartum course has been complicated by IUD that fell out two weeks ago. She has been abstinent since then.  Baby is doing well. Baby is feeding by breast. Bleeding staining only. Bowel function is normal. Bladder function is normal. Patient is sexually active. Contraception method is  IUD which fell out two weeks.  . Postpartum depression screening: negative. ? ? ?The pregnancy intention screening data noted above was reviewed. Potential methods of contraception were discussed. The patient elected to proceed with No data recorded. ? ? Edinburgh Postnatal Depression Scale - 05/01/21 0952   ? ?  ? Edinburgh Postnatal Depression Scale:  In the Past 7 Days  ? I have been able to laugh and see the funny side of things. 0   ? I have looked forward with enjoyment to things. 1   ? I have blamed myself unnecessarily when things went wrong. 0   ? I have been anxious or worried for no good reason. 0   ? I have felt scared or panicky for no good reason. 0   ? Things have been getting on top of me. 1   ? I have been so unhappy that I have had difficulty sleeping. 0   ? I have felt sad or miserable. 1   ? I have been so unhappy that I have been crying. 0   ? The thought of harming myself has occurred to me. 0   ? Edinburgh Postnatal Depression Scale Total 3   ? ?  ?  ? ?  ? ? ?Health Maintenance Due  ?Topic Date Due  ? URINE MICROALBUMIN  Never done  ? ? ?The following portions of the patient's history were reviewed and updated as appropriate: allergies, current medications, past family history,  past medical history, past social history, past surgical history, and problem list. ? ?Review of Systems ?Pertinent items are noted in HPI. ? ?Objective:  ?BP 111/83   Pulse 85   Ht 5\' 6"  (1.676 m)   Wt 221 lb 8 oz (100.5 kg)   LMP  (LMP Unknown)   Breastfeeding No   BMI 35.75 kg/m?   ? ?General:  alert, cooperative, and no distress  ? Breasts:  normal  ?Lungs: clear to auscultation bilaterally  ?Heart:  regular rate and rhythm, S1, S2 normal, no murmur, click, rub or gallop  ?Abdomen: soft, non-tender; bowel sounds normal; no masses,  no organomegaly   ?Wound NA  ?GU exam:  not indicated  ?     ?Assessment:  ? ? ? ?Healthy postpartum exam.  ? ?Plan:  ? ?Essential components of care per ACOG recommendations: ? ?1.  Mood and well being: Patient with negative depression screening today. Reviewed local resources for support.  ?- Patient tobacco use? No.   ?- hx of drug use? No.   ? ?2. Infant care and feeding:  ?-Patient currently breastmilk feeding? No.  ?-Social determinants of health (SDOH) reviewed in EPIC. No concerns,  ?3. Sexuality, contraception and birth spacing ?- Patient does not want a pregnancy in  the next year.  Desired family size is 3 right now!  ?- Reviewed reproductive life planning. Reviewed contraceptive methods based on pt preferences and effectiveness.  Patient desired Oral Contraceptive today.  She would like to try pills for 3-4 months and then see if she wants to do a Nexplanon.  ?- Discussed birth spacing of 18 months ? ?4. Sleep and fatigue ?-Encouraged family/partner/community support of 4 hrs of uninterrupted sleep to help with mood and fatigue ? ?5. Physical Recovery  ?- Discussed patients delivery and complications. She describes her labor as good. ?- Patient had a Vaginal, no problems at delivery. Patient had a  no  laceration. Perineal healing reviewed. Patient expressed understanding ?- Patient has urinary incontinence? No. ?- Patient is safe to resume physical and sexual  activity ? ?6.  Health Maintenance ?- HM due items addressed Yes ?- Last pap smear  ?Diagnosis  ?Date Value Ref Range Status  ?06/01/2019   Final  ? - Negative for intraepithelial lesion or malignancy (NILM)  ? Pap smear not done at today's visit.  ?-Breast Cancer screening indicated? No.  ? ?7. Chronic Disease/Pregnancy Condition follow up: Gestational Diabetes ?-Patient will schedule appt for 2 hour PP GTT ?- PCP follow up ? ?Marylene Land, [name] ?[facility] for [employer], [facility] ? ?

## 2021-05-08 ENCOUNTER — Other Ambulatory Visit: Payer: Medicaid Other

## 2021-05-08 DIAGNOSIS — Z8632 Personal history of gestational diabetes: Secondary | ICD-10-CM | POA: Diagnosis not present

## 2021-05-09 LAB — GLUCOSE TOLERANCE, 2 HOURS
Glucose, 2 hour: 87 mg/dL (ref 70–139)
Glucose, GTT - Fasting: 95 mg/dL (ref 70–99)

## 2021-08-07 ENCOUNTER — Ambulatory Visit: Payer: Medicaid Other | Admitting: Student

## 2021-10-25 DIAGNOSIS — O09299 Supervision of pregnancy with other poor reproductive or obstetric history, unspecified trimester: Secondary | ICD-10-CM | POA: Diagnosis not present

## 2021-10-25 DIAGNOSIS — O26899 Other specified pregnancy related conditions, unspecified trimester: Secondary | ICD-10-CM | POA: Diagnosis not present

## 2021-10-25 DIAGNOSIS — N939 Abnormal uterine and vaginal bleeding, unspecified: Secondary | ICD-10-CM | POA: Diagnosis not present

## 2021-10-25 DIAGNOSIS — Z3A00 Weeks of gestation of pregnancy not specified: Secondary | ICD-10-CM | POA: Diagnosis not present

## 2021-10-25 DIAGNOSIS — O209 Hemorrhage in early pregnancy, unspecified: Secondary | ICD-10-CM | POA: Diagnosis not present

## 2021-10-26 DIAGNOSIS — O200 Threatened abortion: Secondary | ICD-10-CM | POA: Diagnosis not present

## 2021-10-26 DIAGNOSIS — Z3A00 Weeks of gestation of pregnancy not specified: Secondary | ICD-10-CM | POA: Diagnosis not present

## 2021-10-26 DIAGNOSIS — O26891 Other specified pregnancy related conditions, first trimester: Secondary | ICD-10-CM | POA: Diagnosis not present

## 2021-10-26 DIAGNOSIS — O3481 Maternal care for other abnormalities of pelvic organs, first trimester: Secondary | ICD-10-CM | POA: Diagnosis not present

## 2021-10-26 DIAGNOSIS — N939 Abnormal uterine and vaginal bleeding, unspecified: Secondary | ICD-10-CM | POA: Diagnosis not present

## 2021-11-06 DIAGNOSIS — O200 Threatened abortion: Secondary | ICD-10-CM | POA: Diagnosis not present

## 2021-11-11 DIAGNOSIS — O200 Threatened abortion: Secondary | ICD-10-CM | POA: Diagnosis not present

## 2021-11-19 ENCOUNTER — Other Ambulatory Visit: Payer: Medicaid Other

## 2021-11-19 ENCOUNTER — Telehealth: Payer: Self-pay | Admitting: Family Medicine

## 2021-11-19 ENCOUNTER — Other Ambulatory Visit: Payer: Self-pay

## 2021-11-19 DIAGNOSIS — O3680X0 Pregnancy with inconclusive fetal viability, not applicable or unspecified: Secondary | ICD-10-CM

## 2021-11-19 NOTE — Telephone Encounter (Signed)
Addendum: I also informed Shelsea to follow strict ectopic precautions.She voices understanding. Staci Acosta

## 2021-11-19 NOTE — Telephone Encounter (Signed)
I called [place] to discuss her request. She explained she has seen in past for prenatal care and delivered in February. She moved to [facility] and was seen there  in September for vaginal bleeding and was found to be pregnant. They checked her bhcg 3 times and she needs follow up . She states she is still having bleeding like a light period but denies any pain. She states she is here in [place] and wants to have follow up bhcg. I explained I will need to review her request and chart with a provider and see what plan of care they recommend and then I will call her back. She voices understanding. Staci Acosta

## 2021-11-19 NOTE — Telephone Encounter (Signed)
Patient is calling requesting a Lab order to get her HCG Levels checked.

## 2021-11-19 NOTE — Addendum Note (Signed)
Addended by: Samuel Germany on: 11/19/2021 03:20 PM   Modules accepted: Orders

## 2021-11-19 NOTE — Telephone Encounter (Signed)
Reviewed patient request and history with Dr. Ernestina Patches. Orders given for [place] asap and non stat bhcg x 2 2-3 days apart . I scheduled [place] for tomorrow 2pm. I called Lance and gave her plan of care recommendations and she agreed to [place] appointment tomorrow and bhcg lab appointment for today . I explained I would schedule second non stat bhcg for Friday. She voices understanding. After call ended I discussed with lab patient is coming today and again on Friday. Our office is closed Friday so patient will be informed she can go to another [facility] for non stat bhcg.  Staci Acosta

## 2021-11-20 ENCOUNTER — Ambulatory Visit
Admission: RE | Admit: 2021-11-20 | Discharge: 2021-11-20 | Disposition: A | Discharge status: Home / Self Care | Payer: Medicaid Other | Source: Ambulatory Visit | Attending: Family Medicine | Admitting: Family Medicine

## 2021-11-20 ENCOUNTER — Ambulatory Visit (INDEPENDENT_AMBULATORY_CARE_PROVIDER_SITE_OTHER): Payer: Medicaid Other | Admitting: General Practice

## 2021-11-20 ENCOUNTER — Inpatient Hospital Stay (HOSPITAL_COMMUNITY)
Admission: AD | Admit: 2021-11-20 | Discharge: 2021-11-20 | Disposition: A | Discharge status: Home / Self Care | Payer: Medicaid Other | Attending: Obstetrics & Gynecology | Admitting: Obstetrics & Gynecology

## 2021-11-20 ENCOUNTER — Encounter (HOSPITAL_COMMUNITY): Payer: Self-pay | Admitting: Obstetrics & Gynecology

## 2021-11-20 DIAGNOSIS — Z6790 Unspecified blood type, Rh positive: Secondary | ICD-10-CM | POA: Insufficient documentation

## 2021-11-20 DIAGNOSIS — O00101 Right tubal pregnancy without intrauterine pregnancy: Secondary | ICD-10-CM | POA: Diagnosis not present

## 2021-11-20 DIAGNOSIS — O3680X0 Pregnancy with inconclusive fetal viability, not applicable or unspecified: Secondary | ICD-10-CM | POA: Insufficient documentation

## 2021-11-20 DIAGNOSIS — Z3689 Encounter for other specified antenatal screening: Secondary | ICD-10-CM | POA: Diagnosis not present

## 2021-11-20 DIAGNOSIS — Z3A00 Weeks of gestation of pregnancy not specified: Secondary | ICD-10-CM | POA: Diagnosis not present

## 2021-11-20 DIAGNOSIS — Z712 Person consulting for explanation of examination or test findings: Secondary | ICD-10-CM

## 2021-11-20 LAB — CBC
HCT: 40.6 % (ref 36.0–46.0)
Hemoglobin: 13.3 g/dL (ref 12.0–15.0)
MCH: 26.9 pg (ref 26.0–34.0)
MCHC: 32.8 g/dL (ref 30.0–36.0)
MCV: 82 fL (ref 80.0–100.0)
Platelets: 333 10*3/uL (ref 150–400)
RBC: 4.95 MIL/uL (ref 3.87–5.11)
RDW: 13.4 % (ref 11.5–15.5)
WBC: 7 10*3/uL (ref 4.0–10.5)
nRBC: 0 % (ref 0.0–0.2)

## 2021-11-20 LAB — COMPREHENSIVE METABOLIC PANEL
ALT: 29 U/L (ref 0–44)
AST: 19 U/L (ref 15–41)
Albumin: 3.9 g/dL (ref 3.5–5.0)
Alkaline Phosphatase: 86 U/L (ref 38–126)
Anion gap: 7 (ref 5–15)
BUN: 10 mg/dL (ref 6–20)
CO2: 24 mmol/L (ref 22–32)
Calcium: 9.9 mg/dL (ref 8.9–10.3)
Chloride: 104 mmol/L (ref 98–111)
Creatinine, Ser: 0.8 mg/dL (ref 0.44–1.00)
GFR, Estimated: >60 mL/min (ref >60)
Glucose, Bld: 101 mg/dL — ABNORMAL HIGH (ref 70–99)
Potassium: 4.2 mmol/L (ref 3.5–5.1)
Sodium: 135 mmol/L (ref 135–145)
Total Bilirubin: 0.5 mg/dL (ref 0.3–1.2)
Total Protein: 7.6 g/dL (ref 6.5–8.1)

## 2021-11-20 LAB — HCG, QUANTITATIVE, PREGNANCY: hCG, Beta Chain, Quant, S: 3063 m[IU]/mL — ABNORMAL HIGH (ref <5)

## 2021-11-20 LAB — BETA HCG QUANT (REF LAB): hCG Quant: 2347 m[IU]/mL

## 2021-11-20 MED ORDER — METHOTREXATE FOR ECTOPIC PREGNANCY
50.0000 mg/m2 | Freq: Once | INTRAMUSCULAR | Status: AC
  Administered 2021-11-20: 105 mg via INTRAMUSCULAR
  Filled 2021-11-20: qty 10

## 2021-11-20 NOTE — MAU Provider Note (Signed)
History     CSN: [id number]  Arrival date and time: 11/20/21 1556   Event Date/Time   First Provider Initiated Contact with Patient 11/20/21 1634      36 y.o. [id number] @unknown  gestation presenting from office for ectopic pregnancy. Reports small amount of VB for the last 3 weeks. Denies abdominal or pelvic pain. LMP was 10/07/21. She's been seen at another facility and had abnormally rising quants. [place] today shows probable right ectopic pregnancy.     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   Recommendations [x]  Aspirin 81 mg daily after 12 weeks; discontinue after 36 weeks [ ]  Nutrition consult [ ]  Weight gain 11-20 lbs for singleton and 25-35 lbs for twin pregnancy (IOM guidelines) Higher class of obesity patients recommended to gain closer to lower limit  Weight loss is associated with adverse outcomes [ ]  Screen for DM with A1C or early 2 hr GTT [ ]  Baseline and surveillance labs (   Positive GBS test 11/09/2019   Pre-eclampsia, mild 11/25/2019   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   Pseudodeficiency variant detected for Tay-Sachs Not a variant of concern Genetic Counseling: on 07/20/2019 [] partner testing   Supervision of low-risk pregnancy 05/29/2019    Nursing Staff Provider Office Location  [facility] Dating  LMP Language   English Anatomy US  Normal - incomplete - FU 7/7 - normal Flu Vaccine  Declined-06/01/19 Genetic Screen  NIPS: LR  AFP:  Not performed   TDaP vaccine   09-13-19  Hgb A1C or  GTT Early 5.7 Third trimester WNL  Glucose, Fasting 65 - 91 mg/dL 88  Glucose, 1 hour 65 - 179 mg/dL 166  Glucose, 2 hour 65 - 152 mg/dL 124   Rhogam  NA       Past Surgical History:  Procedure Laterality Date   NO PAST SURGERIES       Family History  Problem Relation Age of Onset   Thyroid disease Mother    Alcohol abuse Neg Hx     Social History   Tobacco Use   Smoking status: Never   Smokeless tobacco: Never  Vaping Use   Vaping Use: Never used  Substance Use Topics   Alcohol use: Not Currently    Comment: occ   Drug use: Not Currently    Types: Marijuana    Comment: last used 3 wks ago from 07/18/19    Allergies: No Known Allergies  Medications Prior to Admission  Medication Sig Dispense Refill Last Dose   Norethindrone Acetate-Ethinyl Estrad-FE (LOESTRIN 24 FE) 1-20 MG-MCG(24) tablet Take 1 tablet by mouth daily. (Patient not taking: Reported on 11/20/2021) 28 tablet 11 Not Taking    Review of Systems  Gastrointestinal:  Negative for abdominal pain.  Genitourinary:  Positive for vaginal bleeding.   Physical Exam   Blood pressure 120/82, pulse 74, temperature 98.3 F (36.8 C), resp. rate 18, height 5\' 6"  (1.676 m), weight 95.3 kg, last menstrual period 10/07/2021, not currently breastfeeding.  Physical Exam Vitals and nursing note  reviewed.  Constitutional:      General: She is in acute distress (tearful).     Appearance: Normal appearance.  HENT:     Head: Normocephalic and atraumatic.  Cardiovascular:     Rate and Rhythm: Normal rate.  Pulmonary:     Effort: Pulmonary effort is normal. No respiratory distress.  Musculoskeletal:        General: Normal range of motion.     Cervical back: Normal range of motion.  Neurological:     General: No focal deficit present.     Mental Status: She is alert and oriented to person, place, and time.  Psychiatric:        Mood and Affect: Mood normal.        Behavior: Behavior normal.    Results for orders placed or performed during the hospital encounter of 11/20/21 (from the past 24 hour(s))  CBC     Status: None   Collection Time: 11/20/21  4:55 PM  Result Value Ref Range   WBC 7.0 4.0 - 10.5 K/uL   RBC 4.95 3.87 - 5.11 MIL/uL    Hemoglobin 13.3 12.0 - 15.0 g/dL   HCT 54.6 27.0 - 35.0 %   MCV 82.0 80.0 - 100.0 fL   MCH 26.9 26.0 - 34.0 pg   MCHC 32.8 30.0 - 36.0 g/dL   RDW 09.3 81.8 - 29.9 %   Platelets 333 150 - 400 K/uL   nRBC 0.0 0.0 - 0.2 %  Comprehensive metabolic panel     Status: Abnormal   Collection Time: 11/20/21  4:55 PM  Result Value Ref Range   Sodium 135 135 - 145 mmol/L   Potassium 4.2 3.5 - 5.1 mmol/L   Chloride 104 98 - 111 mmol/L   CO2 24 22 - 32 mmol/L   Glucose, Bld 101 (H) 70 - 99 mg/dL   BUN 10 6 - 20 mg/dL   Creatinine, Ser 3.71 0.44 - 1.00 mg/dL   Calcium 9.9 8.9 - 69.6 mg/dL   Total Protein 7.6 6.5 - 8.1 g/dL   Albumin 3.9 3.5 - 5.0 g/dL   AST 19 15 - 41 U/L   ALT 29 0 - 44 U/L   Alkaline Phosphatase 86 38 - 126 U/L   Total Bilirubin 0.5 0.3 - 1.2 mg/dL   GFR, Estimated >78 >93 mL/min   Anion gap 7 5 - 15  hCG, quantitative, pregnancy     Status: Abnormal   Collection Time: 11/20/21  4:57 PM  Result Value Ref Range   hCG, Beta Chain, Quant, S 3,063 (H) <5 mIU/mL   US OB LESS THAN 14 WEEKS WITH OB TRANSVAGINAL  Result Date: 11/20/2021 CLINICAL DATA:  Rule out ectopic pregnancy.  Assess viability. EXAM: OBSTETRIC <14 WK [place] AND TRANSVAGINAL OB US TECHNIQUE: Both transabdominal and transvaginal ultrasound examinations were performed for complete evaluation of the gestation as well as the maternal uterus, adnexal regions, and pelvic cul-de-sac. Transvaginal technique was performed to assess early pregnancy. COMPARISON:  None Available. FINDINGS: Intrauterine gestational sac: None Yolk sac:  Not Visualized. Embryo:  Not Visualized. Cardiac Activity: Not Visualized. Heart Rate: N/A  bpm Maternal uterus/adnexae: Right ovary: Normal appearance of the right ovary. Within the right adnexa there is a solid-appearing mass, which is separate from the right ovary measuring 2.9 x 2.4 x 2.4 cm. Left ovary: Normal Other :None. Free fluid: Small volume of simple appearing free fluid within the pelvis  IMPRESSION: 1. No intrauterine gestational sac, yolk sac, or fetal  pole identified. Differential considerations include intrauterine pregnancy too early to be sonographically visualized, missed abortion, or ectopic pregnancy. 2. There is a solid right adnexal mass, separate from the right ovary for which ectopic pregnancy cannot be excluded. Electronically Signed   By: Signa Kell M.D.   On: 11/20/2021 15:14    MAU Course  Procedures  MDM Labs and [place] reviewed.   The risks of methotrexate were reviewed including failure requiring repeat dosing or eventual surgery. She understands that methotrexate involves frequent return visits to monitor lab values and that she remains at risk of ectopic rupture until her beta is less than assay. The patient opts to proceed with methotrexate. She has no history of hepatic or renal dysfunction, has normal BUN/Cr/LFT's/platelets. She is felt to be reliable for follow-up. Side effects of photosensitivity & GI upset were discussed. She knows to avoid direct sunlight and abstain from alcohol, aspirin and aspirin-like products for two weeks. She was counseled to discontinue any MVI with folic acid. She understands to follow up on D4 (10/16) and D7 (10/19) for repeat BHCG and was given the instruction sheet. Strict ectopic precautions were reviewed, the patient knows to call or return with any abdominal pain, vomiting, fainting, or any concerns with her health. Rh pos.  Methotrexate given. Stable for discharge home.  Assessment and Plan   1. Right tubal pregnancy without intrauterine pregnancy   2. Rh(D) positive    Discharge home Follow up at [facility] on 10/16 and 10/19 Strict return precautions  Allergies as of 11/20/2021   No Known Allergies      Medication List     STOP taking these medications    Norethindrone Acetate-Ethinyl Estrad-FE 1-20 MG-MCG(24) tablet Commonly known as: LOESTRIN 24 FE        Monae Topping Denyse Amass, [name] 11/20/2021, 8:11 PM

## 2021-11-20 NOTE — Progress Notes (Signed)
Patient presents to office from ultrasound for results. Report was called to Dr Ernestina Patches regarding visualized right ectopic pregnancy. Per Dr Ernestina Patches, patient needs to go to MAU for MTX. Informed patient of results. Discussed going to MAU now for evaluation/treatment. Advised patient it is important she goes to MAU from here as ectopic pregnancies can quickly turn into emergency situations. Patient verbalized understanding. MAU called & notified.   Koren Bound RN BSN 11/20/21

## 2021-11-20 NOTE — MAU Note (Signed)
.  Jessica Lutz is a 36 y.o. at Unknown here in MAU reporting: sent from office for ectopic pregnancy . U/S showed it wa in her Right tube. Pt deneis any pain. Reprots some vag bleeding no discharge.  LMP: 09/17/21 Onset of complaint: today Pain score: 0 There were no vitals filed for this visit.   FHT:n/a Lab orders placed from triage:

## 2021-11-23 ENCOUNTER — Ambulatory Visit (HOSPITAL_COMMUNITY): Payer: Medicaid Other

## 2021-11-24 ENCOUNTER — Ambulatory Visit (INDEPENDENT_AMBULATORY_CARE_PROVIDER_SITE_OTHER): Payer: Medicaid Other | Admitting: *Deleted

## 2021-11-24 ENCOUNTER — Other Ambulatory Visit: Payer: Self-pay

## 2021-11-24 VITALS — BP 116/73 | HR 73 | Ht 66.0 in | Wt 208.6 lb

## 2021-11-24 DIAGNOSIS — O008 Other ectopic pregnancy without intrauterine pregnancy: Secondary | ICD-10-CM

## 2021-11-24 LAB — BETA HCG QUANT (REF LAB): hCG Quant: 1648 m[IU]/mL

## 2021-11-24 NOTE — Progress Notes (Signed)
Here for stat bchg day 4 after methotrexate.She denies pain and states still having light bleeding that has gotten lighter.  Stat bhcg drawn. Explained will call with results in a few hours after results received and reviewed with provider. She voices understanding.  Staci Acosta 1:25pm: Addendum: Results received and reviewed with Dr. Ernestina Patches. She advised appropriate drop in bhcg, needs to keep day 7 stat bhcg. I called [place] and informed her results and recommendations and reviewed ectopic precautions. She voices understanding. Staci Acosta

## 2021-11-27 ENCOUNTER — Ambulatory Visit (INDEPENDENT_AMBULATORY_CARE_PROVIDER_SITE_OTHER): Payer: Medicaid Other

## 2021-11-27 ENCOUNTER — Other Ambulatory Visit: Payer: Self-pay

## 2021-11-27 VITALS — BP 109/75 | HR 80 | Temp 98.0°F | Wt 210.4 lb

## 2021-11-27 DIAGNOSIS — O00101 Right tubal pregnancy without intrauterine pregnancy: Secondary | ICD-10-CM | POA: Diagnosis not present

## 2021-11-27 LAB — BETA HCG QUANT (REF LAB): hCG Quant: 1012 m[IU]/mL

## 2021-11-27 NOTE — Progress Notes (Signed)
Beta HCG Follow-up Visit  Jessica Lutz presents to [place] for follow-up beta HCG lab. She was seen in MAU for methotrexate to treat ectopic pregnancy on 11/20/21. Patient denies pain, reports light vaginal bleeding today. Discussed with patient that we are following beta HCG levels today. Valid contact number for patient confirmed. I will call the patient with results.   Beta HCG results: 11/20/21     DAY 1 3063  11/24/21     DAY 4 1648  11/27/21     DAY 7  1012   Results and patient history reviewed with [name], MD, who states patient should return in 1 week for non stat HCG. Patient called and informed of plan for follow-up. Desires OCPs following resolution of pregnancy.  [name] 11/27/2021 8:41 AM

## 2021-12-04 ENCOUNTER — Other Ambulatory Visit: Payer: Self-pay

## 2021-12-04 ENCOUNTER — Other Ambulatory Visit: Payer: Medicaid Other

## 2021-12-04 DIAGNOSIS — O00101 Right tubal pregnancy without intrauterine pregnancy: Secondary | ICD-10-CM

## 2021-12-05 LAB — BETA HCG QUANT (REF LAB): hCG Quant: 346 m[IU]/mL

## 2021-12-08 ENCOUNTER — Telehealth: Payer: Self-pay | Admitting: *Deleted

## 2021-12-08 ENCOUNTER — Encounter: Payer: Self-pay | Admitting: *Deleted

## 2021-12-08 NOTE — Telephone Encounter (Addendum)
-----   Message from Caren Macadam, MD sent at 12/05/2021 10:59 AM EDT ----- Appropriately decreasing bhch continue follow to zero. These can be weekly  10/30  1430  Called pt and she did not answer.  Message left requesting her to check her Mychart for a message from me regarding an upcoming appointment.

## 2021-12-10 ENCOUNTER — Other Ambulatory Visit: Payer: Self-pay | Admitting: *Deleted

## 2021-12-10 DIAGNOSIS — O00101 Right tubal pregnancy without intrauterine pregnancy: Secondary | ICD-10-CM

## 2021-12-11 ENCOUNTER — Other Ambulatory Visit: Payer: Medicaid Other

## 2021-12-11 DIAGNOSIS — O00101 Right tubal pregnancy without intrauterine pregnancy: Secondary | ICD-10-CM

## 2021-12-13 LAB — BETA HCG QUANT (REF LAB): hCG Quant: 123 m[IU]/mL

## 2021-12-15 ENCOUNTER — Telehealth: Payer: Self-pay | Admitting: *Deleted

## 2021-12-15 DIAGNOSIS — O00101 Right tubal pregnancy without intrauterine pregnancy: Secondary | ICD-10-CM

## 2021-12-15 NOTE — Telephone Encounter (Addendum)
-----   Message from Caren Macadam, MD sent at 12/15/2021  1:33 PM EST ----- Bhcg continues to decrease. Needs to be followed to zero due to ectopic with MXT treatment  11/6  1340  Called pt and informed of test result as well as plan of care.  Pt voiced understanding and agreed to next BHCG test on 11/9 @ 1100.

## 2021-12-18 ENCOUNTER — Other Ambulatory Visit: Payer: Medicaid Other

## 2021-12-18 ENCOUNTER — Other Ambulatory Visit: Payer: Self-pay

## 2021-12-18 DIAGNOSIS — O00101 Right tubal pregnancy without intrauterine pregnancy: Secondary | ICD-10-CM

## 2021-12-19 LAB — BETA HCG QUANT (REF LAB): hCG Quant: 20 m[IU]/mL

## 2021-12-22 ENCOUNTER — Telehealth: Payer: Self-pay | Admitting: *Deleted

## 2021-12-22 DIAGNOSIS — O00101 Right tubal pregnancy without intrauterine pregnancy: Secondary | ICD-10-CM

## 2021-12-22 NOTE — Telephone Encounter (Signed)
-----   Message from Federico Flake, MD sent at 12/22/2021  2:26 PM EST ----- Needs a repeat beta this week or early next week.

## 2021-12-22 NOTE — Telephone Encounter (Signed)
I called Jessica Lutz and informed her of bhcg result and recommendation for repeat . She agreed to lab appointment for 12/25/21.  Nancy Fetter

## 2021-12-25 ENCOUNTER — Other Ambulatory Visit: Payer: Medicaid Other

## 2021-12-25 ENCOUNTER — Other Ambulatory Visit: Payer: Self-pay

## 2021-12-25 DIAGNOSIS — O00101 Right tubal pregnancy without intrauterine pregnancy: Secondary | ICD-10-CM | POA: Diagnosis not present

## 2021-12-26 ENCOUNTER — Telehealth: Payer: Self-pay

## 2021-12-26 DIAGNOSIS — Z309 Encounter for contraceptive management, unspecified: Secondary | ICD-10-CM

## 2021-12-26 LAB — BETA HCG QUANT (REF LAB): hCG Quant: 2 m[IU]/mL

## 2021-12-26 MED ORDER — NORGESTIMATE-ETH ESTRADIOL 0.25-35 MG-MCG PO TABS: 1.0000 | ORAL_TABLET | Freq: Every day | ORAL | 13 refills | Status: AC

## 2021-12-26 NOTE — Telephone Encounter (Signed)
-----   Message from Federico Flake, MD sent at 12/26/2021 11:26 AM EST ----- Bhcg is not below threshold. Patient expressed several weeks ago about wanting OCP.  If she does please send in Sprintec 1 pill PO daily with 13 refills. We can also encourage a contraceptive options visit if she wants to talk about other methods.

## 2021-12-26 NOTE — Telephone Encounter (Signed)
Called pt to discuss latest beta Hcg results. Offered pt Sprintec rx per K. Newton MD. Pt stated she would like to begin Sprintec. Rx sent to pharmacy.
